# Patient Record
Sex: Male | Born: 1975 | Race: White | Hispanic: No | Smoking: Current every day smoker
Health system: Southern US, Community
[De-identification: ages and names within clinical notes are randomized; demographics above are authoritative.]

## PROBLEM LIST (undated history)

## (undated) DIAGNOSIS — E119 Type 2 diabetes mellitus without complications: Secondary | ICD-10-CM

## (undated) DIAGNOSIS — I10 Essential (primary) hypertension: Secondary | ICD-10-CM

## (undated) DIAGNOSIS — F32A Depression, unspecified: Secondary | ICD-10-CM

## (undated) DIAGNOSIS — J449 Chronic obstructive pulmonary disease, unspecified: Secondary | ICD-10-CM

## (undated) DIAGNOSIS — E7800 Pure hypercholesterolemia, unspecified: Secondary | ICD-10-CM

## (undated) DIAGNOSIS — R45851 Suicidal ideations: Secondary | ICD-10-CM

## (undated) DIAGNOSIS — K219 Gastro-esophageal reflux disease without esophagitis: Secondary | ICD-10-CM

## (undated) DIAGNOSIS — F419 Anxiety disorder, unspecified: Secondary | ICD-10-CM

## (undated) DIAGNOSIS — F329 Major depressive disorder, single episode, unspecified: Secondary | ICD-10-CM

## (undated) HISTORY — PX: VASCULAR SURGERY: SHX849

## (undated) HISTORY — PX: LEG SURGERY: SHX1003

## (undated) HISTORY — PX: MENISCUS REPAIR: SHX5179

## (undated) HISTORY — PX: APPENDECTOMY: SHX54

## (undated) HISTORY — PX: ABDOMINAL SURGERY: SHX537

---

## 2000-07-10 ENCOUNTER — Emergency Department (HOSPITAL_COMMUNITY): Admission: EM | Admit: 2000-07-10 | Discharge: 2000-07-10 | Payer: Self-pay | Admitting: Emergency Medicine

## 2003-03-21 ENCOUNTER — Emergency Department (HOSPITAL_COMMUNITY): Admission: AD | Admit: 2003-03-21 | Discharge: 2003-03-21 | Payer: Self-pay

## 2005-01-30 ENCOUNTER — Ambulatory Visit: Payer: Self-pay | Admitting: Family Medicine

## 2005-03-01 ENCOUNTER — Ambulatory Visit: Payer: Self-pay | Admitting: Family Medicine

## 2005-06-28 ENCOUNTER — Ambulatory Visit: Payer: Self-pay | Admitting: Family Medicine

## 2005-08-03 ENCOUNTER — Ambulatory Visit: Payer: Self-pay | Admitting: Family Medicine

## 2006-03-06 ENCOUNTER — Ambulatory Visit: Payer: Self-pay | Admitting: Physician Assistant

## 2006-06-15 ENCOUNTER — Emergency Department (HOSPITAL_COMMUNITY): Admission: EM | Admit: 2006-06-15 | Discharge: 2006-06-16 | Payer: Self-pay | Admitting: Emergency Medicine

## 2011-04-13 ENCOUNTER — Emergency Department (HOSPITAL_COMMUNITY): Payer: Medicaid Other

## 2011-04-13 ENCOUNTER — Other Ambulatory Visit: Payer: Self-pay

## 2011-04-13 ENCOUNTER — Emergency Department (HOSPITAL_COMMUNITY)
Admission: EM | Admit: 2011-04-13 | Discharge: 2011-04-13 | Disposition: A | Discharge status: Home / Self Care | Payer: Medicaid Other | Attending: Emergency Medicine | Admitting: Emergency Medicine

## 2011-04-13 ENCOUNTER — Encounter (HOSPITAL_COMMUNITY): Payer: Self-pay | Admitting: Emergency Medicine

## 2011-04-13 DIAGNOSIS — R7309 Other abnormal glucose: Secondary | ICD-10-CM | POA: Insufficient documentation

## 2011-04-13 DIAGNOSIS — R42 Dizziness and giddiness: Secondary | ICD-10-CM

## 2011-04-13 DIAGNOSIS — R21 Rash and other nonspecific skin eruption: Secondary | ICD-10-CM | POA: Insufficient documentation

## 2011-04-13 DIAGNOSIS — Z9889 Other specified postprocedural states: Secondary | ICD-10-CM | POA: Insufficient documentation

## 2011-04-13 DIAGNOSIS — K219 Gastro-esophageal reflux disease without esophagitis: Secondary | ICD-10-CM | POA: Insufficient documentation

## 2011-04-13 DIAGNOSIS — F329 Major depressive disorder, single episode, unspecified: Secondary | ICD-10-CM | POA: Insufficient documentation

## 2011-04-13 DIAGNOSIS — I10 Essential (primary) hypertension: Secondary | ICD-10-CM | POA: Insufficient documentation

## 2011-04-13 DIAGNOSIS — W19XXXA Unspecified fall, initial encounter: Secondary | ICD-10-CM | POA: Insufficient documentation

## 2011-04-13 DIAGNOSIS — R51 Headache: Secondary | ICD-10-CM | POA: Insufficient documentation

## 2011-04-13 DIAGNOSIS — Z136 Encounter for screening for cardiovascular disorders: Secondary | ICD-10-CM | POA: Insufficient documentation

## 2011-04-13 DIAGNOSIS — R112 Nausea with vomiting, unspecified: Secondary | ICD-10-CM | POA: Insufficient documentation

## 2011-04-13 DIAGNOSIS — F17200 Nicotine dependence, unspecified, uncomplicated: Secondary | ICD-10-CM | POA: Insufficient documentation

## 2011-04-13 DIAGNOSIS — H539 Unspecified visual disturbance: Secondary | ICD-10-CM | POA: Insufficient documentation

## 2011-04-13 DIAGNOSIS — F3289 Other specified depressive episodes: Secondary | ICD-10-CM | POA: Insufficient documentation

## 2011-04-13 HISTORY — DX: Depression, unspecified: F32.A

## 2011-04-13 HISTORY — DX: Gastro-esophageal reflux disease without esophagitis: K21.9

## 2011-04-13 HISTORY — DX: Major depressive disorder, single episode, unspecified: F32.9

## 2011-04-13 HISTORY — DX: Chronic obstructive pulmonary disease, unspecified: J44.9

## 2011-04-13 HISTORY — DX: Essential (primary) hypertension: I10

## 2011-04-13 LAB — COMPREHENSIVE METABOLIC PANEL
BUN: 5 mg/dL — ABNORMAL LOW (ref 6–23)
CO2: 24 mEq/L (ref 19–32)
Calcium: 9.2 mg/dL (ref 8.4–10.5)
Chloride: 100 mEq/L (ref 96–112)
Creatinine, Ser: 0.78 mg/dL (ref 0.50–1.35)
GFR calc Af Amer: >90 mL/min (ref >90)
GFR calc non Af Amer: >90 mL/min (ref >90)
Total Bilirubin: 0.3 mg/dL (ref 0.3–1.2)

## 2011-04-13 LAB — CBC
HCT: 40.5 % (ref 39.0–52.0)
MCH: 30.6 pg (ref 26.0–34.0)
MCV: 84.2 fL (ref 78.0–100.0)
RBC: 4.81 MIL/uL (ref 4.22–5.81)
WBC: 7.6 10*3/uL (ref 4.0–10.5)

## 2011-04-13 MED ORDER — SODIUM CHLORIDE 0.9 % IV SOLN
INTRAVENOUS | Status: DC
  Administered 2011-04-13: 18:00:00 via INTRAVENOUS

## 2011-04-13 MED ORDER — MECLIZINE HCL 12.5 MG PO TABS
25.0000 mg | ORAL_TABLET | Freq: Once | ORAL | Status: AC
  Administered 2011-04-13: 25 mg via ORAL
  Filled 2011-04-13: qty 2

## 2011-04-13 MED ORDER — ONDANSETRON HCL 4 MG/2ML IJ SOLN
4.0000 mg | Freq: Once | INTRAMUSCULAR | Status: AC
  Administered 2011-04-13: 4 mg via INTRAVENOUS
  Filled 2011-04-13: qty 2

## 2011-04-13 MED ORDER — SODIUM CHLORIDE 0.9 % IV BOLUS (SEPSIS)
250.0000 mL | Freq: Once | INTRAVENOUS | Status: AC
  Administered 2011-04-13: 250 mL via INTRAVENOUS

## 2011-04-13 NOTE — ED Provider Notes (Addendum)
History     CSN: [id number]  Arrival date & time 04/13/11  1634   First MD Initiated Contact with Patient 04/13/11 1656      Chief Complaint  Patient presents with  . Fall  . Dizziness    (Consider location/radiation/quality/duration/timing/severity/associated sxs/prior treatment) The history is provided by the patient and the spouse.  patient is a 36 year old male with the complaint of dizziness room spinning and falling over and nausea from the spinning. Symptoms started 3 days ago. Patient was seen at [facility] 2 days ago given a diagnosis of vertigo and sent home with Antivert. The Antivert helps some but patient still having trouble with the room spinning and made worse by moving his head at times makes difficult to keep his balance and has fallen over. He denies any upper tremulous 70 weakness. Admits to the nausea and mild headache. No other systemic symptoms. More it did not do any lab test or CAT scans.  Past Medical History  Diagnosis Date  . Hypertension   . GERD (gastroesophageal reflux disease)   . COPD (chronic obstructive pulmonary disease)   . Depression     Past Surgical History  Procedure Date  . Appendectomy   . Abdominal surgery   . Meniscus repair   . Vascular surgery   . Leg surgery     when 12. Patient reports he was in tractor accident and leg had to be repaired    No family history on file.  History  Substance Use Topics  . Smoking status: Current Everyday Smoker -- 1.5 packs/day    Types: Cigarettes  . Smokeless tobacco: Not on file  . Alcohol Use: No      Review of Systems  Constitutional: Negative for fever.  HENT: Negative for congestion, sore throat, neck pain and neck stiffness.   Eyes: Positive for visual disturbance. Negative for photophobia, pain and redness.  Respiratory: Negative for cough and shortness of breath.   Cardiovascular: Negative for chest pain.  Gastrointestinal: Positive for nausea and vomiting. Negative for  abdominal pain and diarrhea.  Genitourinary: Negative for dysuria.  Musculoskeletal: Negative for back pain.  Neurological: Positive for dizziness and headaches. Negative for facial asymmetry, speech difficulty, weakness and numbness.  Hematological: Does not bruise/bleed easily.  Psychiatric/Behavioral: Negative for confusion.    Allergies  Pineapple  Home Medications   Current Outpatient Rx  Name Route Sig Dispense Refill  . BENAZEPRIL HCL 20 MG PO TABS Oral Take 20 mg by mouth daily.    Marland Kitchen CITALOPRAM HYDROBROMIDE 20 MG PO TABS Oral Take 20 mg by mouth daily.    Marland Kitchen ESOMEPRAZOLE MAGNESIUM 40 MG PO CPDR Oral Take 40 mg by mouth daily before breakfast.    . IBUPROFEN 200 MG PO TABS Oral Take 200 mg by mouth 3 (three) times daily as needed. For sinus relief    . LORAZEPAM 0.5 MG PO TABS Oral Take 0.5 mg by mouth 2 (two) times daily as needed. For anxiety    . MELOXICAM 7.5 MG PO TABS Oral Take 7.5 mg by mouth 2 (two) times daily.    Marland Kitchen SINUS RELIEF PO Oral Take 2 tablets by mouth 3 (three) times daily as needed. For sinus relief    . TIOTROPIUM BROMIDE MONOHYDRATE 18 MCG IN CAPS Inhalation Place 18 mcg into inhaler and inhale daily.      BP 149/97  Pulse 96  Temp(Src) 97.8 F (36.6 C) (Oral)  Resp 18  Ht 6' (1.829 m)  Wt 240 lb (  108.863 kg)  BMI 32.55 kg/m2  SpO2 97%  Physical Exam  Nursing note and vitals reviewed. Constitutional: He is oriented to person, place, and time. He appears well-developed and well-nourished. No distress.  HENT:  Head: Normocephalic and atraumatic.  Mouth/Throat: Oropharynx is clear and moist.  Eyes: Conjunctivae and EOM are normal. Pupils are equal, round, and reactive to light.  Neck: Normal range of motion. Neck supple.  Cardiovascular: Normal rate, regular rhythm and normal heart sounds.   No murmur heard. Pulmonary/Chest: Effort normal and breath sounds normal. He has no wheezes.  Abdominal: Soft. Bowel sounds are normal. There is no  tenderness.  Musculoskeletal: Normal range of motion. He exhibits no edema.  Lymphadenopathy:    He has no cervical adenopathy.  Neurological: He is alert and oriented to person, place, and time. No cranial nerve deficit. He exhibits normal muscle tone. Coordination normal.  Skin: Skin is warm. Rash noted. No erythema.       A few petechiae from the sock line around the ankle down on the feet on both legs. No rash anywhere else.    ED Course  Procedures (including critical care time)  Labs Reviewed  CBC - Abnormal; Notable for the following:    MCHC 36.3 (*)    All other components within normal limits  COMPREHENSIVE METABOLIC PANEL - Abnormal; Notable for the following:    Sodium 134 (*)    Glucose, Bld 257 (*)    BUN 5 (*)    All other components within normal limits   Dg Chest 2 View  04/13/2011  *RADIOLOGY REPORT*  Clinical Data: Dizziness, vertigo  CHEST - 2 VIEW  Comparison: 06/15/2006  Findings: Lungs are clear. No pleural effusion or pneumothorax.  Cardiomediastinal silhouette is within normal limits.  Visualized osseous structures are within normal limits.  IMPRESSION: Normal chest radiographs.  Original Report Authenticated By: Charline Bills, M.D.   Ct Head Wo Contrast  04/13/2011  *RADIOLOGY REPORT*  Clinical Data: Fall, dizziness  CT HEAD WITHOUT CONTRAST  Technique:  Contiguous axial images were obtained from the base of the skull through the vertex without contrast.  Comparison: [place] CT head dated 10/18/2010  Findings: No evidence of parenchymal hemorrhage or extra-axial fluid collection. No mass lesion, mass effect, or midline shift.  No CT evidence of acute infarction.  Cerebral volume is age appropriate.  No ventriculomegaly.  Partial opacification of the left ethmoid and sphenoid sinuses. The visualized paranasal sinuses and mastoid air cells are otherwise clear.  No evidence of calvarial fracture.  IMPRESSION: No evidence of acute intracranial abnormality.   Original Report Authenticated By: Charline Bills, M.D.   Results for orders placed during the hospital encounter of 04/13/11  CBC      Component Value Range   WBC 7.6  4.0 - 10.5 (K/uL)   RBC 4.81  4.22 - 5.81 (MIL/uL)   Hemoglobin 14.7  13.0 - 17.0 (g/dL)   HCT 16.1  09.6 - 04.5 (%)   MCV 84.2  78.0 - 100.0 (fL)   MCH 30.6  26.0 - 34.0 (pg)   MCHC 36.3 (*) 30.0 - 36.0 (g/dL)   RDW 40.9  81.1 - 91.4 (%)   Platelets 202  150 - 400 (K/uL)  COMPREHENSIVE METABOLIC PANEL      Component Value Range   Sodium 134 (*) 135 - 145 (mEq/L)   Potassium 3.9  3.5 - 5.1 (mEq/L)   Chloride 100  96 - 112 (mEq/L)   CO2 24  19 -  32 (mEq/L)   Glucose, Bld 257 (*) 70 - 99 (mg/dL)   BUN 5 (*) 6 - 23 (mg/dL)   Creatinine, Ser 1.61  0.50 - 1.35 (mg/dL)   Calcium 9.2  8.4 - 09.6 (mg/dL)   Total Protein 6.5  6.0 - 8.3 (g/dL)   Albumin 4.0  3.5 - 5.2 (g/dL)   AST 26  0 - 37 (U/L)   ALT 39  0 - 53 (U/L)   Alkaline Phosphatase 60  39 - 117 (U/L)   Total Bilirubin 0.3  0.3 - 1.2 (mg/dL)   GFR calc non Af Amer >90  >90 (mL/min)   GFR calc Af Amer >90  >90 (mL/min)    Date: 04/13/2011  Rate: 79  Rhythm: normal sinus rhythm  QRS Axis: normal  Intervals: normal  ST/T Wave abnormalities: nonspecific T wave changes  Conduction Disutrbances:none  Narrative Interpretation:   Old EKG Reviewed: none available    1. Vertigo       MDM   Symptoms consistent with vertigo benign positional, most likely viral in etiology. Today's workup negative for any significant findings in the head chest x-ray. Labs without any significant amount his other than an elevation in the blood sugar which will require followup with his primary care Dr. Janae Bridgeman be related to the nausea and vomiting related to the vertigo or may actually be indicative of early diabetes.  The patient will followup with primary care Dr. If symptoms persist may require MRI and/or neurology or ENT evaluation. We'll continue Antivert for now.  EKG  without any arrhythmias. Cardiac monitoring any sniffing findings.  Improved with Antivert in the emergency department.       Shelda Jakes, MD 04/13/11 0454  Shelda Jakes, MD 04/13/11 [phone]

## 2011-04-13 NOTE — ED Notes (Signed)
Patient reports that he is having pain in left side of his neck that radiates from his ear to his left shoulder that started after his fall around 1600. Patient describes pain as "like a toothache, throbbing and aching"

## 2011-04-13 NOTE — ED Notes (Signed)
Patient arrives via EMS with c/o fall and dizziness. Patient treated at [facility] Wednesday for Vertigo and treated with meclizine. Patient reports several episodes of sudden onset of dizziness where he falls to the floor. Patient mobile on scene when EMS arrived.

## 2011-04-13 NOTE — ED Notes (Signed)
Patient's IV started by EMS in Left AC infiltrated. Patient c/o burning pain. Swelling noted. IV discontinued by Tarri Glenn, RN and new IV inserted.

## 2011-06-04 ENCOUNTER — Encounter (HOSPITAL_COMMUNITY): Payer: Self-pay | Admitting: *Deleted

## 2011-06-04 ENCOUNTER — Emergency Department (HOSPITAL_COMMUNITY)
Admission: EM | Admit: 2011-06-04 | Discharge: 2011-06-04 | Disposition: A | Discharge status: Home Health | Payer: Medicaid Other | Attending: Emergency Medicine | Admitting: Emergency Medicine

## 2011-06-04 DIAGNOSIS — F17200 Nicotine dependence, unspecified, uncomplicated: Secondary | ICD-10-CM | POA: Insufficient documentation

## 2011-06-04 DIAGNOSIS — J4489 Other specified chronic obstructive pulmonary disease: Secondary | ICD-10-CM | POA: Insufficient documentation

## 2011-06-04 DIAGNOSIS — I10 Essential (primary) hypertension: Secondary | ICD-10-CM | POA: Insufficient documentation

## 2011-06-04 DIAGNOSIS — Z79899 Other long term (current) drug therapy: Secondary | ICD-10-CM | POA: Insufficient documentation

## 2011-06-04 DIAGNOSIS — F3289 Other specified depressive episodes: Secondary | ICD-10-CM | POA: Insufficient documentation

## 2011-06-04 DIAGNOSIS — K219 Gastro-esophageal reflux disease without esophagitis: Secondary | ICD-10-CM | POA: Insufficient documentation

## 2011-06-04 DIAGNOSIS — J449 Chronic obstructive pulmonary disease, unspecified: Secondary | ICD-10-CM | POA: Insufficient documentation

## 2011-06-04 DIAGNOSIS — F329 Major depressive disorder, single episode, unspecified: Secondary | ICD-10-CM | POA: Insufficient documentation

## 2011-06-04 LAB — CBC
Hemoglobin: 15.7 g/dL (ref 13.0–17.0)
Platelets: 227 10*3/uL (ref 150–400)
RBC: 5.12 MIL/uL (ref 4.22–5.81)
WBC: 7.4 10*3/uL (ref 4.0–10.5)

## 2011-06-04 LAB — COMPREHENSIVE METABOLIC PANEL
ALT: 46 U/L (ref 0–53)
AST: 32 U/L (ref 0–37)
Alkaline Phosphatase: 60 U/L (ref 39–117)
CO2: 28 mEq/L (ref 19–32)
Calcium: 9.6 mg/dL (ref 8.4–10.5)
Chloride: 99 mEq/L (ref 96–112)
GFR calc non Af Amer: >90 mL/min (ref >90)
Potassium: 3.7 mEq/L (ref 3.5–5.1)
Sodium: 136 mEq/L (ref 135–145)
Total Bilirubin: 0.6 mg/dL (ref 0.3–1.2)

## 2011-06-04 NOTE — ED Provider Notes (Signed)
History   This chart was scribed for Nelia Shi, MD by Sofie Rower. The patient was seen in room APAH8/APAH8 and the patient's care was started at 4:14PM.    CSN: [id number]  Arrival date & time 06/04/11  1509   First MD Initiated Contact with Patient 06/04/11 1605      Chief Complaint  Patient presents with  . Medical Clearance    (Consider location/radiation/quality/duration/timing/severity/associated sxs/prior treatment) HPI  Willie Herman is a 36 y.o. male who presents to the Emergency Department complaining of severe, episodic depression onset today with associated symptoms of suicidal ideations. Pt states "I feel like I need some help, my medicine is all messed up." Pt states "he went to visit the Doctor this morning and his medication was changed." Pt relative states the pt would go from being very angry to crying when on his previous medication (Celexa). Pt relative states the pt has suicidal ideations and extreme anger outbursts.   Pt has a hx of taking celexa.       Past Medical History  Diagnosis Date  . Hypertension   . GERD (gastroesophageal reflux disease)   . COPD (chronic obstructive pulmonary disease)   . Depression     Past Surgical History  Procedure Date  . Appendectomy   . Abdominal surgery   . Meniscus repair   . Vascular surgery   . Leg surgery     when 12. Patient reports he was in tractor accident and leg had to be repaired    History reviewed. No pertinent family history.  History  Substance Use Topics  . Smoking status: Current Everyday Smoker -- 1.5 packs/day    Types: Cigarettes  . Smokeless tobacco: Not on file  . Alcohol Use: No      Review of Systems  All other systems reviewed and are negative.    10 Systems reviewed and are negative for acute change except as noted in the HPI.   Allergies  Pineapple  Home Medications   Current Outpatient Rx  Name Route Sig Dispense Refill  . ALPRAZOLAM 1 MG PO TABS Oral Take 1  mg by mouth 3 (three) times daily as needed.    Marland Kitchen AMLODIPINE BESYLATE 5 MG PO TABS Oral Take 5 mg by mouth daily.    Marland Kitchen ESCITALOPRAM OXALATE 20 MG PO TABS Oral Take 10 mg by mouth daily.    Marland Kitchen FLUTICASONE PROPIONATE 50 MCG/ACT NA SUSP Nasal Place 2 sprays into the nose daily.    Marland Kitchen LORAZEPAM 0.5 MG PO TABS Oral Take 0.5 mg by mouth 2 (two) times daily as needed. For anxiety    . TIOTROPIUM BROMIDE MONOHYDRATE 18 MCG IN CAPS Inhalation Place 18 mcg into inhaler and inhale daily.    Marland Kitchen VILAZODONE HCL 40 MG PO TABS Oral Take 40 mg by mouth daily.    . ALBUTEROL SULFATE HFA 108 (90 BASE) MCG/ACT IN AERS Inhalation Inhale 2 puffs into the lungs every 4 (four) hours as needed.    Marland Kitchen BENAZEPRIL HCL 20 MG PO TABS Oral Take 20 mg by mouth daily.    Marland Kitchen ESOMEPRAZOLE MAGNESIUM 40 MG PO CPDR Oral Take 40 mg by mouth daily before breakfast.    . IBUPROFEN 200 MG PO TABS Oral Take 200 mg by mouth 3 (three) times daily as needed. For sinus relief    . SINUS RELIEF PO Oral Take 2 tablets by mouth 3 (three) times daily as needed. For sinus relief  BP 139/97  Pulse 86  Temp(Src) 97.4 F (36.3 C) (Oral)  Resp 18  Ht 6' (1.829 m)  Wt 246 lb (111.585 kg)  BMI 33.36 kg/m2  SpO2 97%  Physical Exam  Nursing note and vitals reviewed. Constitutional: He is oriented to person, place, and time. He appears well-developed and well-nourished. No distress.  HENT:  Head: Normocephalic and atraumatic.  Nose: Nose normal.  Eyes: EOM are normal. Pupils are equal, round, and reactive to light.  Neck: Normal range of motion.  Cardiovascular: Normal rate and intact distal pulses.   Pulmonary/Chest: No respiratory distress.  Abdominal: Normal appearance. He exhibits no distension.  Musculoskeletal: Normal range of motion.  Neurological: He is alert and oriented to person, place, and time. No cranial nerve deficit.  Skin: Skin is warm and dry. No rash noted.    ED Course  Procedures (including critical care  time)  DIAGNOSTIC STUDIES: Oxygen Saturation is 97% on room air , normal by my interpretation.    COORDINATION OF CARE:     Labs Reviewed  CBC - Abnormal; Notable for the following:    MCHC 36.6 (*)    All other components within normal limits  COMPREHENSIVE METABOLIC PANEL - Abnormal; Notable for the following:    Glucose, Bld 134 (*)    All other components within normal limits  ETHANOL  URINE RAPID DRUG SCREEN (HOSP PERFORMED)   No results found.   1. Depression      4:16PM-EDP at bedside discusses treatment plan concerning Tele psych.   4:50PM- Physician Consult. Dr. Radford Pax speaks with Psychiatrist who will conduct the Tele Psych examination.   MDM  [place] psychiatrist evaluated the patient in the emergency department and recommended continuation with his medications.  No psychiatric admission indicated at this time.      I personally performed the services described in this documentation, which was scribed in my presence. The recorded information has been reviewed and considered.   Nelia Shi, MD 06/04/11 [phone]

## 2011-06-04 NOTE — ED Notes (Signed)
Pt left prior to receiving d/c pprwrk

## 2011-06-04 NOTE — Discharge Instructions (Signed)
Depression  Depression is a strong emotion of feeling unhappy that can last for weeks, months, or even longer. Depression causes problems with the ability to function in life. It upsets your:   Relationships.   Sleep.   Eating habits.   Work habits.  HOME CARE  Take all medicine as told by your doctor.   Talk with a therapist, counselor, or friend.   Eat a healthy diet.   Exercise regularly.   Do not drink alcohol or use drugs.  GET HELP RIGHT AWAY IF: You start to have thoughts about hurting yourself or others. MAKE SURE YOU:  Understand these instructions.   Will watch your condition.   Will get help right away if you are not doing well or get worse.  Document Released: 04/14/2010 Document Revised: 03/01/2011 Document Reviewed: 04/14/2010 Exit[employer] Patient Information 2012 [employer]. 

## 2011-06-04 NOTE — ED Notes (Signed)
Had medication change, says the meds are not working for him.  "sometimes I Haw[place]'t feel like being here"

## 2011-12-02 ENCOUNTER — Emergency Department (HOSPITAL_COMMUNITY)
Admission: EM | Admit: 2011-12-02 | Discharge: 2011-12-02 | Disposition: A | Discharge status: Home / Self Care | Payer: Medicaid Other | Attending: Emergency Medicine | Admitting: Emergency Medicine

## 2011-12-02 ENCOUNTER — Emergency Department (HOSPITAL_COMMUNITY): Payer: Medicaid Other

## 2011-12-02 ENCOUNTER — Encounter (HOSPITAL_COMMUNITY): Payer: Self-pay | Admitting: *Deleted

## 2011-12-02 DIAGNOSIS — F3289 Other specified depressive episodes: Secondary | ICD-10-CM | POA: Insufficient documentation

## 2011-12-02 DIAGNOSIS — S93409A Sprain of unspecified ligament of unspecified ankle, initial encounter: Secondary | ICD-10-CM | POA: Insufficient documentation

## 2011-12-02 DIAGNOSIS — F17200 Nicotine dependence, unspecified, uncomplicated: Secondary | ICD-10-CM | POA: Insufficient documentation

## 2011-12-02 DIAGNOSIS — F329 Major depressive disorder, single episode, unspecified: Secondary | ICD-10-CM | POA: Insufficient documentation

## 2011-12-02 DIAGNOSIS — J4489 Other specified chronic obstructive pulmonary disease: Secondary | ICD-10-CM | POA: Insufficient documentation

## 2011-12-02 DIAGNOSIS — Z79899 Other long term (current) drug therapy: Secondary | ICD-10-CM | POA: Insufficient documentation

## 2011-12-02 DIAGNOSIS — J449 Chronic obstructive pulmonary disease, unspecified: Secondary | ICD-10-CM | POA: Insufficient documentation

## 2011-12-02 DIAGNOSIS — I10 Essential (primary) hypertension: Secondary | ICD-10-CM | POA: Insufficient documentation

## 2011-12-02 DIAGNOSIS — W219XXA Striking against or struck by unspecified sports equipment, initial encounter: Secondary | ICD-10-CM | POA: Insufficient documentation

## 2011-12-02 DIAGNOSIS — S20219A Contusion of unspecified front wall of thorax, initial encounter: Secondary | ICD-10-CM | POA: Insufficient documentation

## 2011-12-02 DIAGNOSIS — S93401A Sprain of unspecified ligament of right ankle, initial encounter: Secondary | ICD-10-CM

## 2011-12-02 DIAGNOSIS — K219 Gastro-esophageal reflux disease without esophagitis: Secondary | ICD-10-CM | POA: Insufficient documentation

## 2011-12-02 DIAGNOSIS — Y9361 Activity, american tackle football: Secondary | ICD-10-CM | POA: Insufficient documentation

## 2011-12-02 DIAGNOSIS — Z9089 Acquired absence of other organs: Secondary | ICD-10-CM | POA: Insufficient documentation

## 2011-12-02 MED ORDER — IBUPROFEN 800 MG PO TABS
800.0000 mg | ORAL_TABLET | Freq: Once | ORAL | Status: AC
  Administered 2011-12-02: 800 mg via ORAL
  Filled 2011-12-02: qty 1

## 2011-12-02 MED ORDER — IBUPROFEN 800 MG PO TABS: 800.0000 mg | ORAL_TABLET | Freq: Three times a day (TID) | ORAL | Status: AC

## 2011-12-02 MED ORDER — METHOCARBAMOL 500 MG PO TABS
1000.0000 mg | ORAL_TABLET | Freq: Once | ORAL | Status: AC
  Administered 2011-12-02: 1000 mg via ORAL
  Filled 2011-12-02: qty 2

## 2011-12-02 MED ORDER — METHOCARBAMOL 500 MG PO TABS: ORAL_TABLET | ORAL | Status: DC

## 2011-12-02 NOTE — ED Provider Notes (Signed)
History     CSN: [id number]  Arrival date & time 12/02/11  4098   First MD Initiated Contact with Patient 12/02/11 1911      Chief Complaint  Patient presents with  . Rib Injury  . Ankle Pain    (Consider location/radiation/quality/duration/timing/severity/associated sxs/prior treatment) HPI Comments: Patient states he was helping a football pain and no practice on September 6. The patient states he sustained a hit to the right ribs and as a result twisted the right ankle. He is bedtime to rest the area but the pain seems to be getting progressively worse he presents to the emergency department for evaluation of this problem. The patient denies cough. He denies any hemoptysis. He has had some swelling of the ankle some pain when applying weight but is able to walk slowly.  The history is provided by the patient.    Past Medical History  Diagnosis Date  . Hypertension   . GERD (gastroesophageal reflux disease)   . COPD (chronic obstructive pulmonary disease)   . Depression     Past Surgical History  Procedure Date  . Appendectomy   . Abdominal surgery   . Meniscus repair   . Vascular surgery   . Leg surgery     when 12. Patient reports he was in tractor accident and leg had to be repaired    No family history on file.  History  Substance Use Topics  . Smoking status: Current Everyday Smoker -- 1.5 packs/day    Types: Cigarettes  . Smokeless tobacco: Not on file  . Alcohol Use: No      Review of Systems  Constitutional: Negative for activity change.       All ROS Neg except as noted in HPI  HENT: Negative for nosebleeds and neck pain.   Eyes: Negative for photophobia and discharge.  Respiratory: Positive for shortness of breath. Negative for cough and wheezing.   Cardiovascular: Negative for chest pain and palpitations.  Gastrointestinal: Negative for abdominal pain and blood in stool.  Genitourinary: Negative for dysuria, frequency and hematuria.    Musculoskeletal: Negative for back pain and arthralgias.  Skin: Negative.   Neurological: Negative for dizziness, seizures and speech difficulty.  Psychiatric/Behavioral: Negative for hallucinations and confusion.       Depression    Allergies  Pineapple  Home Medications   Current Outpatient Rx  Name Route Sig Dispense Refill  . ALBUTEROL SULFATE HFA 108 (90 BASE) MCG/ACT IN AERS Inhalation Inhale 2 puffs into the lungs every 4 (four) hours as needed.    Marland Kitchen AMLODIPINE BESYLATE 5 MG PO TABS Oral Take 5 mg by mouth daily.    Marland Kitchen BENAZEPRIL HCL 20 MG PO TABS Oral Take 20 mg by mouth daily.    Marland Kitchen ESCITALOPRAM OXALATE 20 MG PO TABS Oral Take 10 mg by mouth daily.    Marland Kitchen ESOMEPRAZOLE MAGNESIUM 40 MG PO CPDR Oral Take 40 mg by mouth daily before breakfast.    . FLUTICASONE PROPIONATE 50 MCG/ACT NA SUSP Nasal Place 2 sprays into the nose daily.    Marland Kitchen TIOTROPIUM BROMIDE MONOHYDRATE 18 MCG IN CAPS Inhalation Place 18 mcg into inhaler and inhale daily.      BP 148/77  Pulse 72  Temp 98.4 F (36.9 C) (Oral)  Resp 20  Ht 5\' 11"  (1.803 m)  Wt 232 lb (105.235 kg)  BMI 32.36 kg/m2  SpO2 98%  Physical Exam  Nursing note and vitals reviewed. Constitutional: He is oriented to person, place, and  time. He appears well-developed and well-nourished.  Non-toxic appearance.  HENT:  Head: Normocephalic.  Right Ear: Tympanic membrane and external ear normal.  Left Ear: Tympanic membrane and external ear normal.  Eyes: EOM and lids are normal. Pupils are equal, round, and reactive to light.  Neck: Normal range of motion. Neck supple. Carotid bruit is not present.  Cardiovascular: Normal rate, regular rhythm, normal heart sounds, intact distal pulses and normal pulses.   Pulmonary/Chest: Breath sounds normal. No respiratory distress.       There is soreness to palpation of the right mid chest wall area laterally over the ribs. There is no deformity. There is no bruise noted. There is no crepitus.   Abdominal: Soft. Bowel sounds are normal. There is no tenderness. There is no guarding.       No organomegaly.  Musculoskeletal: Normal range of motion.       There is pain and swelling over the right lateral malleolus. There is full range of motion of the toes. There is good capillary refill. The Achilles tendon is intact. There is no palpable deformity of the tibia or fibula. Distal pulses are 2+ and symmetrical.  Lymphadenopathy:       Head (right side): No submandibular adenopathy present.       Head (left side): No submandibular adenopathy present.    He has no cervical adenopathy.  Neurological: He is alert and oriented to person, place, and time. He has normal strength. No cranial nerve deficit or sensory deficit.  Skin: Skin is warm and dry.  Psychiatric: He has a normal mood and affect. His speech is normal.    ED Course  Procedures (including critical care time)  Labs Reviewed - No data to display Dg Ribs Unilateral W/chest Right  12/02/2011  *RADIOLOGY REPORT*  Clinical Data: Right rib pain.  RIGHT RIBS AND CHEST - 3+ VIEW  Comparison: Chest x-ray 04/13/2011.  Findings: The cardiac silhouette, mediastinal and hilar contours are within normal limits and stable.  The lungs are clear.  No pleural effusion or pneumothorax.  Dedicated views of the right ribs demonstrate no definite acute right rib fracture.  No destructive bony changes.  Remote healed rib fractures are suspected.  IMPRESSION:  1.  No acute cardiopulmonary findings. 2.  No definite acute right-sided rib fractures.   Original Report Authenticated By: P. Loralie Champagne, M.D.    Dg Ankle Complete Right  12/02/2011  *RADIOLOGY REPORT*  Clinical Data: Right ankle injury.  RIGHT ANKLE - COMPLETE 3+ VIEW  Comparison: 06/15/2006.  Findings: The ankle mortise is maintained.  Mild ankle joint degenerative changes.  No acute fracture or osteochondral abnormality.  IMPRESSION: No acute bony findings.   Original Report Authenticated By:  P. Loralie Champagne, M.D.      1. Contusion of ribs   2. Right ankle sprain       MDM  I have reviewed nursing notes, vital signs, and all appropriate lab and imaging results for this patient. The x-ray of the right ribs and chest is negative for cardiopulmonary findings. The x-ray is also negative for acute right-sided rib fractures. The x-ray of the right ankle is negative for acute bony findings. The plan at this time is for the patient to practice deep breaths frequently. He is given a prescription for ibuprofen 800 mg 3 times daily, and Robaxin 3 times daily for spasm. With the warning that the Robaxin may cause drowsiness. Patient was also fitted with an ankle stirrup splint, to use for  the next 10 days.       Kathie Dike, [place] 12/02/11 1956

## 2011-12-02 NOTE — ED Provider Notes (Signed)
Medical screening examination/treatment/procedure(s) were performed by non-physician practitioner and as supervising physician I was immediately available for consultation/collaboration.  Jakaylah Schlafer, MD 12/02/11 2045 

## 2011-12-02 NOTE — ED Notes (Signed)
Pt c/o right rib pain and right ankle pain, pt states that he was helping in football practice when he was hit in the right rib area and twisted right ankle, happened Friday evening.

## 2011-12-02 NOTE — ED Notes (Signed)
Discharge instructions reviewed with pt, questions answered. Pt verbalized understanding.  

## 2012-03-19 ENCOUNTER — Encounter (HOSPITAL_COMMUNITY): Payer: Self-pay | Admitting: Emergency Medicine

## 2012-03-19 ENCOUNTER — Emergency Department (HOSPITAL_COMMUNITY): Payer: MEDICAID

## 2012-03-19 ENCOUNTER — Emergency Department (HOSPITAL_COMMUNITY)
Admission: EM | Admit: 2012-03-19 | Discharge: 2012-03-20 | Disposition: A | Discharge status: Home / Self Care | Payer: MEDICAID | Attending: Emergency Medicine | Admitting: Emergency Medicine

## 2012-03-19 DIAGNOSIS — E7800 Pure hypercholesterolemia, unspecified: Secondary | ICD-10-CM | POA: Insufficient documentation

## 2012-03-19 DIAGNOSIS — Z79899 Other long term (current) drug therapy: Secondary | ICD-10-CM | POA: Insufficient documentation

## 2012-03-19 DIAGNOSIS — F17200 Nicotine dependence, unspecified, uncomplicated: Secondary | ICD-10-CM | POA: Insufficient documentation

## 2012-03-19 DIAGNOSIS — F419 Anxiety disorder, unspecified: Secondary | ICD-10-CM

## 2012-03-19 DIAGNOSIS — Y929 Unspecified place or not applicable: Secondary | ICD-10-CM | POA: Insufficient documentation

## 2012-03-19 DIAGNOSIS — T43591A Poisoning by other antipsychotics and neuroleptics, accidental (unintentional), initial encounter: Secondary | ICD-10-CM | POA: Insufficient documentation

## 2012-03-19 DIAGNOSIS — Z8659 Personal history of other mental and behavioral disorders: Secondary | ICD-10-CM | POA: Insufficient documentation

## 2012-03-19 DIAGNOSIS — F411 Generalized anxiety disorder: Secondary | ICD-10-CM | POA: Insufficient documentation

## 2012-03-19 DIAGNOSIS — E119 Type 2 diabetes mellitus without complications: Secondary | ICD-10-CM | POA: Insufficient documentation

## 2012-03-19 DIAGNOSIS — J4489 Other specified chronic obstructive pulmonary disease: Secondary | ICD-10-CM | POA: Insufficient documentation

## 2012-03-19 DIAGNOSIS — F3289 Other specified depressive episodes: Secondary | ICD-10-CM | POA: Insufficient documentation

## 2012-03-19 DIAGNOSIS — F329 Major depressive disorder, single episode, unspecified: Secondary | ICD-10-CM

## 2012-03-19 DIAGNOSIS — I10 Essential (primary) hypertension: Secondary | ICD-10-CM | POA: Insufficient documentation

## 2012-03-19 DIAGNOSIS — Z8719 Personal history of other diseases of the digestive system: Secondary | ICD-10-CM | POA: Insufficient documentation

## 2012-03-19 DIAGNOSIS — J449 Chronic obstructive pulmonary disease, unspecified: Secondary | ICD-10-CM | POA: Insufficient documentation

## 2012-03-19 DIAGNOSIS — Y939 Activity, unspecified: Secondary | ICD-10-CM | POA: Insufficient documentation

## 2012-03-19 HISTORY — DX: Suicidal ideations: R45.851

## 2012-03-19 HISTORY — DX: Type 2 diabetes mellitus without complications: E11.9

## 2012-03-19 HISTORY — DX: Pure hypercholesterolemia, unspecified: E78.00

## 2012-03-19 HISTORY — DX: Anxiety disorder, unspecified: F41.9

## 2012-03-19 LAB — CBC WITH DIFFERENTIAL/PLATELET
Basophils Absolute: 0 10*3/uL (ref 0.0–0.1)
Basophils Relative: 0 % (ref 0–1)
Eosinophils Absolute: 0.2 10*3/uL (ref 0.0–0.7)
Eosinophils Relative: 3 % (ref 0–5)
HCT: 42.8 % (ref 39.0–52.0)
Lymphocytes Relative: 38 % (ref 12–46)
MCH: 30.5 pg (ref 26.0–34.0)
MCHC: 36.9 g/dL — ABNORMAL HIGH (ref 30.0–36.0)
MCV: 82.6 fL (ref 78.0–100.0)
Monocytes Absolute: 0.5 10*3/uL (ref 0.1–1.0)
Platelets: 218 10*3/uL (ref 150–400)
RBC: 5.18 MIL/uL (ref 4.22–5.81)
RDW: 11.7 % (ref 11.5–15.5)

## 2012-03-19 LAB — RAPID URINE DRUG SCREEN, HOSP PERFORMED
Barbiturates: NOT DETECTED
Opiates: NOT DETECTED
Tetrahydrocannabinol: NOT DETECTED

## 2012-03-19 LAB — SALICYLATE LEVEL: Salicylate Lvl: <2 mg/dL — ABNORMAL LOW (ref 2.8–20.0)

## 2012-03-19 LAB — COMPREHENSIVE METABOLIC PANEL
AST: 28 U/L (ref 0–37)
Albumin: 4.2 g/dL (ref 3.5–5.2)
BUN: 8 mg/dL (ref 6–23)
Calcium: 9.4 mg/dL (ref 8.4–10.5)
Creatinine, Ser: 0.87 mg/dL (ref 0.50–1.35)
Total Protein: 7.1 g/dL (ref 6.0–8.3)

## 2012-03-19 LAB — ACETAMINOPHEN LEVEL: Acetaminophen (Tylenol), Serum: <15 ug/mL (ref 10–30)

## 2012-03-19 MED ORDER — SODIUM CHLORIDE 0.9 % IV SOLN
INTRAVENOUS | Status: DC
  Administered 2012-03-19: 21:00:00 via INTRAVENOUS

## 2012-03-19 NOTE — ED Notes (Signed)
While police at bedside, pt now states that he did not take any of the medications and he just lied because he and his wife have been having problems and he just wanted her to come to the hospital to be with him and now he is ready to leave.

## 2012-03-19 NOTE — ED Notes (Signed)
Requested patient remove his necklace.  Patient broke his necklace and threw it across room.  Necklace, phone, and clothing placed in bag and in secured closet.
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
## 2012-03-19 NOTE — ED Notes (Addendum)
Per EMS - patient had ativan filled on 12/22 with quantity of 90 pills in bottle. Bottle is empty and patient states he took approximately 80 pills of ativan about 1 1/2 hrs ago. 25 grams charcoal given by EMS en route to ED. Patient alert and oriented at triage. Patient also admits to drinking 3-40oz beers before arriving to ED.

## 2012-03-19 NOTE — ED Notes (Signed)
Wife visiting patient at bedside.  Pt states that he is leaving the ER and that he really did not take the medications that he stated that he took initially.  States he is going to have a lawsuit of everyone of [place].  Pt notified of his involuntary status and begins pulling off monitoring equipment, threw an item at staff, pulled out his IV access.  Security at bedside, police department contacted.

## 2012-03-19 NOTE — ED Notes (Signed)
Pt makes no eye contact or conversation with me at bedside.  Pt explained that we would be collecting urine and blood samples and performing an EKG, pt makes no comment to me at bedside.

## 2012-03-20 NOTE — ED Notes (Signed)
Telepsych consult completed. Pt cooperative at this time

## 2012-03-20 NOTE — Progress Notes (Signed)
2310 Assumed care/disposition of patient. He is here with IVC paperwork due to claim of taking pills for self harm.UDS negative. When wife arrived he changed his story and said he was doing this for attention and to get his wife up to the hospital.  Awaits tele-psych consultation.  1610 Tele psych consultation completed. IVC paperwork rescinded. Patient cleared to be discharged home.

## 2012-03-20 NOTE — ED Notes (Signed)
Pt relocated to rm 15 to be monitored by [employer]. Pt in shackles and yelling and cussing at deputy. Pt jerking at shackles. Deputy has asked him to stop.

## 2012-03-20 NOTE — ED Notes (Signed)
Pt was advised of discharge and belongings returned. Deputy offered and arranged transportation. Pt refused to sign stating "I ain't signin shit". Pt walked out refusing transport. Pt states he will walk home.

## 2012-03-20 NOTE — ED Notes (Signed)
Pt currently denying ingestion of pills with intent to harm self. Pt is not cooperative at this time.

## 2012-04-18 ENCOUNTER — Encounter (HOSPITAL_COMMUNITY): Payer: Self-pay

## 2012-04-18 ENCOUNTER — Emergency Department (HOSPITAL_COMMUNITY)
Admission: EM | Admit: 2012-04-18 | Discharge: 2012-04-18 | Discharge status: Against Medical Advice | Payer: Medicaid Other | Attending: Emergency Medicine | Admitting: Emergency Medicine

## 2012-04-18 DIAGNOSIS — E7800 Pure hypercholesterolemia, unspecified: Secondary | ICD-10-CM | POA: Insufficient documentation

## 2012-04-18 DIAGNOSIS — S0993XA Unspecified injury of face, initial encounter: Secondary | ICD-10-CM | POA: Insufficient documentation

## 2012-04-18 DIAGNOSIS — R45851 Suicidal ideations: Secondary | ICD-10-CM | POA: Insufficient documentation

## 2012-04-18 DIAGNOSIS — F17200 Nicotine dependence, unspecified, uncomplicated: Secondary | ICD-10-CM | POA: Insufficient documentation

## 2012-04-18 DIAGNOSIS — F411 Generalized anxiety disorder: Secondary | ICD-10-CM | POA: Insufficient documentation

## 2012-04-18 DIAGNOSIS — Y9389 Activity, other specified: Secondary | ICD-10-CM | POA: Insufficient documentation

## 2012-04-18 DIAGNOSIS — F329 Major depressive disorder, single episode, unspecified: Secondary | ICD-10-CM | POA: Insufficient documentation

## 2012-04-18 DIAGNOSIS — J329 Chronic sinusitis, unspecified: Secondary | ICD-10-CM | POA: Insufficient documentation

## 2012-04-18 DIAGNOSIS — R739 Hyperglycemia, unspecified: Secondary | ICD-10-CM

## 2012-04-18 DIAGNOSIS — J4489 Other specified chronic obstructive pulmonary disease: Secondary | ICD-10-CM | POA: Insufficient documentation

## 2012-04-18 DIAGNOSIS — Z79899 Other long term (current) drug therapy: Secondary | ICD-10-CM | POA: Insufficient documentation

## 2012-04-18 DIAGNOSIS — J449 Chronic obstructive pulmonary disease, unspecified: Secondary | ICD-10-CM | POA: Insufficient documentation

## 2012-04-18 DIAGNOSIS — K219 Gastro-esophageal reflux disease without esophagitis: Secondary | ICD-10-CM | POA: Insufficient documentation

## 2012-04-18 DIAGNOSIS — F3289 Other specified depressive episodes: Secondary | ICD-10-CM | POA: Insufficient documentation

## 2012-04-18 DIAGNOSIS — E1169 Type 2 diabetes mellitus with other specified complication: Secondary | ICD-10-CM | POA: Insufficient documentation

## 2012-04-18 DIAGNOSIS — Y92009 Unspecified place in unspecified non-institutional (private) residence as the place of occurrence of the external cause: Secondary | ICD-10-CM | POA: Insufficient documentation

## 2012-04-18 DIAGNOSIS — S199XXA Unspecified injury of neck, initial encounter: Secondary | ICD-10-CM | POA: Insufficient documentation

## 2012-04-18 DIAGNOSIS — R296 Repeated falls: Secondary | ICD-10-CM | POA: Insufficient documentation

## 2012-04-18 DIAGNOSIS — I10 Essential (primary) hypertension: Secondary | ICD-10-CM | POA: Insufficient documentation

## 2012-04-18 LAB — CBC WITH DIFFERENTIAL/PLATELET
Eosinophils Absolute: 0.2 10*3/uL (ref 0.0–0.7)
Eosinophils Relative: 2 % (ref 0–5)
Hemoglobin: 15.3 g/dL (ref 13.0–17.0)
Lymphocytes Relative: 28 % (ref 12–46)
Lymphs Abs: 2.4 10*3/uL (ref 0.7–4.0)
MCH: 30.6 pg (ref 26.0–34.0)
MCV: 83.4 fL (ref 78.0–100.0)
Monocytes Relative: 9 % (ref 3–12)
Platelets: 210 10*3/uL (ref 150–400)
RBC: 5 MIL/uL (ref 4.22–5.81)
WBC: 8.5 10*3/uL (ref 4.0–10.5)

## 2012-04-18 LAB — URINE MICROSCOPIC-ADD ON: Urine-Other: NONE SEEN

## 2012-04-18 LAB — URINALYSIS, ROUTINE W REFLEX MICROSCOPIC
Bilirubin Urine: NEGATIVE
Glucose, UA: >1000 mg/dL — AB
Hgb urine dipstick: NEGATIVE
Ketones, ur: NEGATIVE mg/dL
Leukocytes, UA: NEGATIVE
Nitrite: NEGATIVE
Protein, ur: NEGATIVE mg/dL
Specific Gravity, Urine: <1.005 — ABNORMAL LOW (ref 1.005–1.030)
Urobilinogen, UA: 0.2 mg/dL (ref 0.0–1.0)
pH: 6 (ref 5.0–8.0)

## 2012-04-18 LAB — BASIC METABOLIC PANEL
BUN: 8 mg/dL (ref 6–23)
CO2: 21 mEq/L (ref 19–32)
Calcium: 9.4 mg/dL (ref 8.4–10.5)
GFR calc non Af Amer: >90 mL/min (ref >90)
Glucose, Bld: 277 mg/dL — ABNORMAL HIGH (ref 70–99)
Potassium: 3.5 mEq/L (ref 3.5–5.1)
Sodium: 136 mEq/L (ref 135–145)

## 2012-04-18 LAB — ETHANOL: Alcohol, Ethyl (B): 37 mg/dL — ABNORMAL HIGH (ref 0–11)

## 2012-04-18 LAB — RAPID URINE DRUG SCREEN, HOSP PERFORMED
Barbiturates: POSITIVE — AB
Benzodiazepines: NOT DETECTED

## 2012-04-18 MED ORDER — SODIUM CHLORIDE 0.9 % IV SOLN: INTRAVENOUS | Status: DC

## 2012-04-18 MED ORDER — IBUPROFEN 400 MG PO TABS
400.0000 mg | ORAL_TABLET | Freq: Once | ORAL | Status: AC
  Administered 2012-04-18: 400 mg via ORAL
  Filled 2012-04-18: qty 1

## 2012-04-18 MED ORDER — SODIUM CHLORIDE 0.9 % IV BOLUS (SEPSIS)
2000.0000 mL | Freq: Once | INTRAVENOUS | Status: AC
  Administered 2012-04-18: 1000 mL via INTRAVENOUS

## 2012-04-18 NOTE — ED Notes (Signed)
Request pain med for patient, and isn't giving any further orders. Patient request IV out and to leave now.
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
## 2012-04-18 NOTE — ED Notes (Signed)
Pt states he was feeling dizzy tonight and while trying to get to the bathroom he fell.  Pt denies complaints from fall but states he is still dizzy and is also having sinus pain from congestion.

## 2012-05-17 ENCOUNTER — Encounter (HOSPITAL_COMMUNITY): Payer: Self-pay

## 2012-05-17 ENCOUNTER — Emergency Department (HOSPITAL_COMMUNITY): Payer: Medicaid Other

## 2012-05-17 ENCOUNTER — Emergency Department (HOSPITAL_COMMUNITY)
Admission: EM | Admit: 2012-05-17 | Discharge: 2012-05-17 | Disposition: A | Discharge status: Home / Self Care | Payer: Medicaid Other | Attending: Emergency Medicine | Admitting: Emergency Medicine

## 2012-05-17 DIAGNOSIS — J4489 Other specified chronic obstructive pulmonary disease: Secondary | ICD-10-CM | POA: Insufficient documentation

## 2012-05-17 DIAGNOSIS — E7800 Pure hypercholesterolemia, unspecified: Secondary | ICD-10-CM | POA: Insufficient documentation

## 2012-05-17 DIAGNOSIS — F329 Major depressive disorder, single episode, unspecified: Secondary | ICD-10-CM | POA: Insufficient documentation

## 2012-05-17 DIAGNOSIS — F17200 Nicotine dependence, unspecified, uncomplicated: Secondary | ICD-10-CM | POA: Insufficient documentation

## 2012-05-17 DIAGNOSIS — Y9389 Activity, other specified: Secondary | ICD-10-CM | POA: Insufficient documentation

## 2012-05-17 DIAGNOSIS — R269 Unspecified abnormalities of gait and mobility: Secondary | ICD-10-CM | POA: Insufficient documentation

## 2012-05-17 DIAGNOSIS — Y92410 Unspecified street and highway as the place of occurrence of the external cause: Secondary | ICD-10-CM | POA: Insufficient documentation

## 2012-05-17 DIAGNOSIS — S139XXA Sprain of joints and ligaments of unspecified parts of neck, initial encounter: Secondary | ICD-10-CM | POA: Insufficient documentation

## 2012-05-17 DIAGNOSIS — IMO0002 Reserved for concepts with insufficient information to code with codable children: Secondary | ICD-10-CM | POA: Insufficient documentation

## 2012-05-17 DIAGNOSIS — I10 Essential (primary) hypertension: Secondary | ICD-10-CM | POA: Insufficient documentation

## 2012-05-17 DIAGNOSIS — Z79899 Other long term (current) drug therapy: Secondary | ICD-10-CM | POA: Insufficient documentation

## 2012-05-17 DIAGNOSIS — F3289 Other specified depressive episodes: Secondary | ICD-10-CM | POA: Insufficient documentation

## 2012-05-17 DIAGNOSIS — E119 Type 2 diabetes mellitus without complications: Secondary | ICD-10-CM | POA: Insufficient documentation

## 2012-05-17 DIAGNOSIS — K219 Gastro-esophageal reflux disease without esophagitis: Secondary | ICD-10-CM | POA: Insufficient documentation

## 2012-05-17 DIAGNOSIS — S298XXA Other specified injuries of thorax, initial encounter: Secondary | ICD-10-CM | POA: Insufficient documentation

## 2012-05-17 DIAGNOSIS — R51 Headache: Secondary | ICD-10-CM | POA: Insufficient documentation

## 2012-05-17 DIAGNOSIS — F411 Generalized anxiety disorder: Secondary | ICD-10-CM | POA: Insufficient documentation

## 2012-05-17 MED ORDER — HYDROCODONE-ACETAMINOPHEN 5-325 MG PO TABS: 1.0000 | ORAL_TABLET | Freq: Four times a day (QID) | ORAL | Status: DC | PRN

## 2012-05-17 NOTE — ED Notes (Signed)
Patient refused to sign discharge papers .stated he was waiting on his ride and would sign them accordingly. Went back to room and patient had already left without signing but he did get  His discharge papers
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
## 2012-07-02 ENCOUNTER — Emergency Department (HOSPITAL_COMMUNITY): Payer: Medicaid Other

## 2012-07-02 ENCOUNTER — Encounter (HOSPITAL_COMMUNITY): Payer: Self-pay | Admitting: *Deleted

## 2012-07-02 ENCOUNTER — Emergency Department (HOSPITAL_COMMUNITY)
Admission: EM | Admit: 2012-07-02 | Discharge: 2012-07-02 | Disposition: A | Discharge status: Home / Self Care | Payer: Medicaid Other | Attending: Emergency Medicine | Admitting: Emergency Medicine

## 2012-07-02 DIAGNOSIS — S62639B Displaced fracture of distal phalanx of unspecified finger, initial encounter for open fracture: Secondary | ICD-10-CM

## 2012-07-02 DIAGNOSIS — K219 Gastro-esophageal reflux disease without esophagitis: Secondary | ICD-10-CM | POA: Insufficient documentation

## 2012-07-02 DIAGNOSIS — E119 Type 2 diabetes mellitus without complications: Secondary | ICD-10-CM | POA: Insufficient documentation

## 2012-07-02 DIAGNOSIS — F17200 Nicotine dependence, unspecified, uncomplicated: Secondary | ICD-10-CM | POA: Insufficient documentation

## 2012-07-02 DIAGNOSIS — E7800 Pure hypercholesterolemia, unspecified: Secondary | ICD-10-CM | POA: Insufficient documentation

## 2012-07-02 DIAGNOSIS — F411 Generalized anxiety disorder: Secondary | ICD-10-CM | POA: Insufficient documentation

## 2012-07-02 DIAGNOSIS — F3289 Other specified depressive episodes: Secondary | ICD-10-CM | POA: Insufficient documentation

## 2012-07-02 DIAGNOSIS — F329 Major depressive disorder, single episode, unspecified: Secondary | ICD-10-CM | POA: Insufficient documentation

## 2012-07-02 DIAGNOSIS — W230XXA Caught, crushed, jammed, or pinched between moving objects, initial encounter: Secondary | ICD-10-CM | POA: Insufficient documentation

## 2012-07-02 DIAGNOSIS — S61209A Unspecified open wound of unspecified finger without damage to nail, initial encounter: Secondary | ICD-10-CM | POA: Insufficient documentation

## 2012-07-02 DIAGNOSIS — J4489 Other specified chronic obstructive pulmonary disease: Secondary | ICD-10-CM | POA: Insufficient documentation

## 2012-07-02 DIAGNOSIS — Y9289 Other specified places as the place of occurrence of the external cause: Secondary | ICD-10-CM | POA: Insufficient documentation

## 2012-07-02 DIAGNOSIS — Z23 Encounter for immunization: Secondary | ICD-10-CM | POA: Insufficient documentation

## 2012-07-02 DIAGNOSIS — J449 Chronic obstructive pulmonary disease, unspecified: Secondary | ICD-10-CM | POA: Insufficient documentation

## 2012-07-02 DIAGNOSIS — R45851 Suicidal ideations: Secondary | ICD-10-CM | POA: Insufficient documentation

## 2012-07-02 DIAGNOSIS — S62639A Displaced fracture of distal phalanx of unspecified finger, initial encounter for closed fracture: Secondary | ICD-10-CM | POA: Insufficient documentation

## 2012-07-02 DIAGNOSIS — I10 Essential (primary) hypertension: Secondary | ICD-10-CM | POA: Insufficient documentation

## 2012-07-02 DIAGNOSIS — Y9389 Activity, other specified: Secondary | ICD-10-CM | POA: Insufficient documentation

## 2012-07-02 DIAGNOSIS — Z79899 Other long term (current) drug therapy: Secondary | ICD-10-CM | POA: Insufficient documentation

## 2012-07-02 MED ORDER — LIDOCAINE HCL (PF) 1 % IJ SOLN
5.0000 mL | Freq: Once | INTRAMUSCULAR | Status: AC
  Administered 2012-07-02: 5 mL via INTRADERMAL
  Filled 2012-07-02: qty 5

## 2012-07-02 MED ORDER — HYDROCODONE-ACETAMINOPHEN 5-325 MG PO TABS: ORAL_TABLET | ORAL | Status: DC

## 2012-07-02 MED ORDER — DOXYCYCLINE HYCLATE 100 MG PO TABS
100.0000 mg | ORAL_TABLET | Freq: Once | ORAL | Status: AC
  Administered 2012-07-02: 100 mg via ORAL
  Filled 2012-07-02: qty 1

## 2012-07-02 MED ORDER — TETANUS-DIPHTH-ACELL PERTUSSIS 5-2.5-18.5 LF-MCG/0.5 IM SUSP
0.5000 mL | Freq: Once | INTRAMUSCULAR | Status: AC
  Administered 2012-07-02: 0.5 mL via INTRAMUSCULAR
  Filled 2012-07-02: qty 0.5

## 2012-07-02 MED ORDER — DOXYCYCLINE HYCLATE 100 MG PO CAPS: 100.0000 mg | ORAL_CAPSULE | Freq: Two times a day (BID) | ORAL | Status: DC

## 2012-07-02 MED ORDER — OXYCODONE-ACETAMINOPHEN 5-325 MG PO TABS
1.0000 | ORAL_TABLET | Freq: Once | ORAL | Status: AC
  Administered 2012-07-02: 1 via ORAL
  Filled 2012-07-02: qty 1

## 2012-07-02 NOTE — ED Notes (Signed)
Left middle finger lac by a trailor hitch just PTA.
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
## 2012-07-05 NOTE — ED Provider Notes (Signed)
Medical screening examination/treatment/procedure(s) were performed by non-physician practitioner and as supervising physician I was immediately available for consultation/collaboration.  Rochel Privett, MD 07/05/12 1133 

## 2012-07-09 ENCOUNTER — Emergency Department (HOSPITAL_COMMUNITY)
Admission: EM | Admit: 2012-07-09 | Discharge: 2012-07-09 | Disposition: A | Discharge status: Home / Self Care | Payer: Medicaid Other | Attending: Emergency Medicine | Admitting: Emergency Medicine

## 2012-07-09 ENCOUNTER — Encounter (HOSPITAL_COMMUNITY): Payer: Self-pay

## 2012-07-09 ENCOUNTER — Emergency Department (HOSPITAL_COMMUNITY): Payer: Medicaid Other

## 2012-07-09 DIAGNOSIS — Z79899 Other long term (current) drug therapy: Secondary | ICD-10-CM | POA: Insufficient documentation

## 2012-07-09 DIAGNOSIS — F329 Major depressive disorder, single episode, unspecified: Secondary | ICD-10-CM | POA: Insufficient documentation

## 2012-07-09 DIAGNOSIS — Z8639 Personal history of other endocrine, nutritional and metabolic disease: Secondary | ICD-10-CM | POA: Insufficient documentation

## 2012-07-09 DIAGNOSIS — F411 Generalized anxiety disorder: Secondary | ICD-10-CM | POA: Insufficient documentation

## 2012-07-09 DIAGNOSIS — Y939 Activity, unspecified: Secondary | ICD-10-CM | POA: Insufficient documentation

## 2012-07-09 DIAGNOSIS — S6710XA Crushing injury of unspecified finger(s), initial encounter: Secondary | ICD-10-CM | POA: Insufficient documentation

## 2012-07-09 DIAGNOSIS — S6722XA Crushing injury of left hand, initial encounter: Secondary | ICD-10-CM

## 2012-07-09 DIAGNOSIS — J4489 Other specified chronic obstructive pulmonary disease: Secondary | ICD-10-CM | POA: Insufficient documentation

## 2012-07-09 DIAGNOSIS — Y929 Unspecified place or not applicable: Secondary | ICD-10-CM | POA: Insufficient documentation

## 2012-07-09 DIAGNOSIS — F3289 Other specified depressive episodes: Secondary | ICD-10-CM | POA: Insufficient documentation

## 2012-07-09 DIAGNOSIS — Z862 Personal history of diseases of the blood and blood-forming organs and certain disorders involving the immune mechanism: Secondary | ICD-10-CM | POA: Insufficient documentation

## 2012-07-09 DIAGNOSIS — J449 Chronic obstructive pulmonary disease, unspecified: Secondary | ICD-10-CM | POA: Insufficient documentation

## 2012-07-09 DIAGNOSIS — K219 Gastro-esophageal reflux disease without esophagitis: Secondary | ICD-10-CM | POA: Insufficient documentation

## 2012-07-09 DIAGNOSIS — Z8781 Personal history of (healed) traumatic fracture: Secondary | ICD-10-CM | POA: Insufficient documentation

## 2012-07-09 DIAGNOSIS — E119 Type 2 diabetes mellitus without complications: Secondary | ICD-10-CM | POA: Insufficient documentation

## 2012-07-09 DIAGNOSIS — I10 Essential (primary) hypertension: Secondary | ICD-10-CM | POA: Insufficient documentation

## 2012-07-09 DIAGNOSIS — F17200 Nicotine dependence, unspecified, uncomplicated: Secondary | ICD-10-CM | POA: Insufficient documentation

## 2012-07-09 MED ORDER — HYDROCODONE-ACETAMINOPHEN 5-325 MG PO TABS
1.0000 | ORAL_TABLET | ORAL | Status: DC | PRN
Start: 2012-07-09 — End: 2013-12-29
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
## 2012-07-09 NOTE — ED Notes (Signed)
Pt reports had sutures placed in left middle finger last week and accidentally bumped his finger again on car door this morning.

## 2012-07-09 NOTE — ED Provider Notes (Signed)
Medical screening examination/treatment/procedure(s) were performed by non-physician practitioner and as supervising physician I was immediately available for consultation/collaboration.   Glynn Octave, MD 07/09/12 [phone]

## 2013-12-29 ENCOUNTER — Emergency Department (HOSPITAL_COMMUNITY)
Admission: EM | Admit: 2013-12-29 | Discharge: 2013-12-29 | Disposition: A | Discharge status: Home / Self Care | Payer: Self-pay | Attending: Emergency Medicine | Admitting: Emergency Medicine

## 2013-12-29 ENCOUNTER — Encounter (HOSPITAL_COMMUNITY): Payer: Self-pay | Admitting: Emergency Medicine

## 2013-12-29 DIAGNOSIS — F419 Anxiety disorder, unspecified: Secondary | ICD-10-CM | POA: Insufficient documentation

## 2013-12-29 DIAGNOSIS — Z915 Personal history of self-harm: Secondary | ICD-10-CM | POA: Insufficient documentation

## 2013-12-29 DIAGNOSIS — E119 Type 2 diabetes mellitus without complications: Secondary | ICD-10-CM | POA: Insufficient documentation

## 2013-12-29 DIAGNOSIS — F329 Major depressive disorder, single episode, unspecified: Secondary | ICD-10-CM | POA: Insufficient documentation

## 2013-12-29 DIAGNOSIS — Z720 Tobacco use: Secondary | ICD-10-CM | POA: Insufficient documentation

## 2013-12-29 DIAGNOSIS — I10 Essential (primary) hypertension: Secondary | ICD-10-CM | POA: Insufficient documentation

## 2013-12-29 DIAGNOSIS — K219 Gastro-esophageal reflux disease without esophagitis: Secondary | ICD-10-CM | POA: Insufficient documentation

## 2013-12-29 DIAGNOSIS — J449 Chronic obstructive pulmonary disease, unspecified: Secondary | ICD-10-CM | POA: Insufficient documentation

## 2013-12-29 DIAGNOSIS — Z792 Long term (current) use of antibiotics: Secondary | ICD-10-CM | POA: Insufficient documentation

## 2013-12-29 DIAGNOSIS — G44019 Episodic cluster headache, not intractable: Secondary | ICD-10-CM | POA: Insufficient documentation

## 2013-12-29 DIAGNOSIS — Z79899 Other long term (current) drug therapy: Secondary | ICD-10-CM | POA: Insufficient documentation

## 2013-12-29 LAB — BASIC METABOLIC PANEL
Anion gap: 9 (ref 5–15)
BUN: 7 mg/dL (ref 6–23)
CHLORIDE: 103 meq/L (ref 96–112)
CO2: 27 mEq/L (ref 19–32)
CREATININE: 0.83 mg/dL (ref 0.50–1.35)
Calcium: 9.2 mg/dL (ref 8.4–10.5)
Glucose, Bld: 94 mg/dL (ref 70–99)
Potassium: 4.9 mEq/L (ref 3.7–5.3)
Sodium: 139 mEq/L (ref 137–147)

## 2013-12-29 LAB — CBC WITH DIFFERENTIAL/PLATELET
BASOS PCT: 0 % (ref 0–1)
Basophils Absolute: 0 10*3/uL (ref 0.0–0.1)
EOS ABS: 0.2 10*3/uL (ref 0.0–0.7)
EOS PCT: 3 % (ref 0–5)
HEMATOCRIT: 43.9 % (ref 39.0–52.0)
HEMOGLOBIN: 16.1 g/dL (ref 13.0–17.0)
Lymphocytes Relative: 30 % (ref 12–46)
Lymphs Abs: 1.5 10*3/uL (ref 0.7–4.0)
MCH: 31.4 pg (ref 26.0–34.0)
MCHC: 36.7 g/dL — AB (ref 30.0–36.0)
MCV: 85.6 fL (ref 78.0–100.0)
MONO ABS: 0.4 10*3/uL (ref 0.1–1.0)
MONOS PCT: 8 % (ref 3–12)
Neutro Abs: 3.1 10*3/uL (ref 1.7–7.7)
Neutrophils Relative %: 60 % (ref 43–77)
Platelets: 192 10*3/uL (ref 150–400)
RBC: 5.13 MIL/uL (ref 4.22–5.81)
RDW: 11.9 % (ref 11.5–15.5)
WBC: 5.2 10*3/uL (ref 4.0–10.5)

## 2013-12-29 MED ORDER — METOCLOPRAMIDE HCL 5 MG/ML IJ SOLN
10.0000 mg | Freq: Once | INTRAMUSCULAR | Status: AC
  Administered 2013-12-29: 10 mg via INTRAVENOUS
  Filled 2013-12-29: qty 2

## 2013-12-29 MED ORDER — DIPHENHYDRAMINE HCL 25 MG PO CAPS
25.0000 mg | ORAL_CAPSULE | Freq: Once | ORAL | Status: AC
  Administered 2013-12-29: 25 mg via ORAL
  Filled 2013-12-29: qty 1

## 2013-12-29 MED ORDER — SODIUM CHLORIDE 0.9 % IV SOLN
Freq: Once | INTRAVENOUS | Status: AC
  Administered 2013-12-29: 12:00:00 via INTRAVENOUS

## 2013-12-29 MED ORDER — KETOROLAC TROMETHAMINE 30 MG/ML IJ SOLN
30.0000 mg | Freq: Once | INTRAMUSCULAR | Status: AC
  Administered 2013-12-29: 30 mg via INTRAVENOUS
  Filled 2013-12-29: qty 1

## 2013-12-29 MED ORDER — IBUPROFEN 800 MG PO TABS: 800.0000 mg | ORAL_TABLET | Freq: Three times a day (TID) | ORAL | Status: DC

## 2013-12-29 NOTE — ED Notes (Addendum)
Pt states, "I've been having problems with my blood pressure". Pt states headache and dizziness also, which began last night. Pt states PCP took him off of BP meds 2 years ago. Also states body aches.

## 2013-12-29 NOTE — Discharge Instructions (Signed)
Cluster Headache Cluster headaches are deeply painful. They normally occur on one side of your head, but they may switch sides. Often, cluster headaches:  Are severe.  Happen often for a few weeks or months and then go away for a while.  Last from 15 minutes to 3 hours.  Happen at the same time each day.  Happen at night.  Happen many times a day. HOME CARE  During times when you have cluster headaches:  Get the same amount of sleep every night, at the same time each night.  Avoid alcohol.  Stop smoking if you smoke. GET HELP IF:  There are changes in how bad or how often your headaches happen.  Your medicines are not helping. GET HELP RIGHT AWAY IF:  You pass out (faint).  You become weak or lose feeling (have numbness) on one side of your body or face.  You see two of everything (double vision).  You feel sick to your stomach (nauseous) or throw up (vomit) and do not stop after several hours.  You are off balance or have trouble talking or walking.  You have neck pain or stiffness.  You have a fever. MAKE SURE YOU:  Understand these instructions.  Will watch your condition.  Will get help right away if you are not doing well or get worse. Document Released: 04/19/2004 Document Revised: 03/17/2013 Document Reviewed: 10/02/2012 [employer] Patient Information 2015 [employer]. This information is not intended to replace advice given to you by your health care provider. Make sure you discuss any questions you have with your health care provider.  

## 2013-12-29 NOTE — ED Provider Notes (Signed)
CSN: [id number]     Arrival date & time 12/29/13  1102 History   This patient was seen in room APA09/APA09 and the patient's care was started at 11:32 AM.   None    Chief Complaint  Patient presents with  . Headache    HPI HPI Comments: Willie Herman is a 38 y.o. male, with a h/o HTN, who presents to the Emergency Department complaining of a headache, onset yesterday evening, which has been associated with dizziness upon position change. The pt's PCP stopped his HTN medication two years ago after losing weight. He reports h/o frequent headaches that are sporadic in onset.  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  He states this headache is similar to previous headaches.  He reports some nausea.  He denies fever, neck pain or stiffness, vomiting, visual changes or numbness or weakness of the extremities.      Past Medical History  Diagnosis Date  . Hypertension   . GERD (gastroesophageal reflux disease)   . COPD (chronic obstructive pulmonary disease)   . Depression   . High cholesterol   . Anxiety   . Suicidal ideation   . diet controlled diabetes     former   Past Surgical History  Procedure Laterality Date  . Appendectomy    . Abdominal surgery    . Meniscus repair    . Vascular surgery    . Leg surgery      when 12. Patient reports he was in tractor accident and leg had to be repaired   No family history on file. History  Substance Use Topics  . Smoking status: Current Every Day Smoker -- 1.50 packs/day    Types: Cigarettes  . Smokeless tobacco: Not on file  . Alcohol Use: Yes     Comment: occ    Review of Systems  Constitutional: Negative for fever, activity change and appetite change.  HENT: Negative for facial swelling and trouble swallowing.   Eyes:  Positive for photophobia. Negative for pain and visual disturbance.  Respiratory: Negative for chest tightness and shortness of breath.   Cardiovascular: Negative for chest pain.  Gastrointestinal: Negative for nausea, vomiting and abdominal pain.  Genitourinary: Negative for dysuria.  Musculoskeletal: Negative for neck pain and neck stiffness.  Skin: Negative for rash and wound.  Neurological: Positive for dizziness and headaches. Negative for syncope, facial asymmetry, speech difficulty, weakness and numbness.  Psychiatric/Behavioral: Negative for confusion and decreased concentration.  All other systems reviewed and are negative.     Allergies  Pineapple  Home Medications   Prior to Admission medications   Medication Sig Start Date End Date Taking? Authorizing Provider  albuterol (PROAIR HFA) 108 (90 BASE) MCG/ACT inhaler Inhale 2 puffs into the lungs every 6 (six) hours as needed.    Historical Provider, MD  ALPRAZolam Prudy Feeler) 1 MG tablet Take 1 mg by mouth 3 (three) times daily as needed for sleep or anxiety.    Historical Provider, MD  amLODipine (NORVASC) 5 MG tablet Take 5 mg by mouth daily.    Historical Provider, MD  Azelastine-Fluticasone (DYMISTA) 137-50 MCG/ACT SUSP Place 2 sprays into the nose daily.    Historical Provider, MD  benazepril (LOTENSIN) 20 MG tablet Take 20 mg by mouth daily.  Historical Provider, MD  buPROPion (WELLBUTRIN) 100 MG tablet Take 100 mg by mouth 2 (two) times daily. *Titration of taking one tablet daily for 7 days*    Historical Provider, MD  doxycycline (VIBRAMYCIN) 100 MG capsule Take 1 capsule (100 mg total) by mouth 2 (two) times daily. 07/02/12   Trestan Vahle L. Stepan Verrette, PA-C  escitalopram (LEXAPRO) 20 MG tablet Take 20 mg by mouth daily.     Historical Provider, MD  HYDROcodone-acetaminophen (NORCO/VICODIN) 5-325 MG per tablet Take 1 tablet by mouth every 6 (six) hours as needed for pain. 05/17/12   Benny Lennert, MD  HYDROcodone-acetaminophen  (NORCO/VICODIN) 5-325 MG per tablet Take one-two tabs po q 4-6 hrs prn pain 07/02/12   Iowa Kappes L. Fady Stamps, PA-C  HYDROcodone-acetaminophen (NORCO/VICODIN) 5-325 MG per tablet Take 1 tablet by mouth every 4 (four) hours as needed for pain. 07/09/12   Burgess Amor, PA-C  LORazepam (ATIVAN) 0.5 MG tablet Take 0.5 mg by mouth 3 (three) times daily.    Historical Provider, MD  omeprazole (PRILOSEC) 40 MG capsule Take 40 mg by mouth daily.    Historical Provider, MD  PRESCRIPTION MEDICATION Take 1 tablet by mouth at bedtime.    Historical Provider, MD  tiotropium (SPIRIVA) 18 MCG inhalation capsule Place 18 mcg into inhaler and inhale daily.    Historical Provider, MD  zolpidem (AMBIEN) 10 MG tablet Take 10 mg by mouth at bedtime.    Historical Provider, MD   Triage Vitals: BP 131/74  Pulse 69  Temp(Src) 98.3 F (36.8 C) (Oral)  Resp 16  Ht 5\' 11"  (1.803 m)  Wt 211 lb (95.709 kg)  BMI 29.44 kg/m2  SpO2 99%  Physical Exam  Nursing note and vitals reviewed. Constitutional: He is oriented to person, place, and time. He appears well-developed and well-nourished. No distress.  HENT:  Head: Normocephalic and atraumatic.  Mouth/Throat: Oropharynx is clear and moist.  Eyes: Conjunctivae and EOM are normal. Pupils are equal, round, and reactive to light.  Neck: Normal range of motion, full passive range of motion without pain and phonation normal. Neck supple. No spinous process tenderness and no muscular tenderness present. No rigidity. No Kernig's sign noted.  Cardiovascular: Normal rate, regular rhythm, normal heart sounds and intact distal pulses.   No murmur heard. Pulmonary/Chest: Effort normal and breath sounds normal. No respiratory distress.  Abdominal: Soft. Bowel sounds are normal.  Musculoskeletal: Normal range of motion.  Neurological: He is alert and oriented to person, place, and time. He has normal strength. No cranial nerve deficit or sensory deficit. He exhibits normal muscle tone.  Coordination and gait normal. GCS eye subscore is 4. GCS verbal subscore is 5. GCS motor subscore is 6.  Reflex Scores:      Tricep reflexes are 2+ on the right side and 2+ on the left side.      Bicep reflexes are 2+ on the right side and 2+ on the left side. Skin: Skin is warm and dry.  Psychiatric: He has a normal mood and affect. His behavior is normal.    ED Course  Procedures (including critical care time)  DIAGNOSTIC STUDIES: Oxygen Saturation is 99% on room air, normal by my interpretation.    COORDINATION OF CARE:  11:32 AM- Discussed treatment plan with patient, and the patient agreed to the plan.   Labs Review Labs Reviewed  CBC WITH DIFFERENTIAL - Abnormal; Notable for the following:    MCHC 36.7 (*)    All other components within normal limits  BASIC METABOLIC PANEL    Imaging Review No results found.   EKG Interpretation None      MDM   Final diagnoses:  Episodic cluster headache, not intractable    Orthostatic VS for the past 24 hrs:  BP- Lying Pulse- Lying BP- Sitting Pulse- Sitting BP- Standing at 0 minutes Pulse- Standing at 0 minutes  12/29/13 1244 - - - 63 - -  12/29/13 1240 126/81 mmHg 65 124/84 mmHg - 122/83 mmHg 70     Patient is feeling better after IV medications and fluids. No focal neuro deficits on exam. No meningeal signs. Patient is well-appearing and nontoxic. Patient also seen by Dr. Adriana Simasook and care plan discussed.  Previous history of frequent headaches and reports that today's headache is similar to previous. Have discussed with patient possibility of cluster headaches. He agrees to close followup with his primary care physician or to return to the ER for any worsening symptoms. He appears stable for discharge.     Manasseh Pittsley L. Reggie Bise, PA-C 12/31/13 1652

## 2014-01-02 NOTE — ED Provider Notes (Signed)
Medical screening examination/treatment/procedure(s) were conducted as a shared visit with non-physician practitioner(s) and myself.  I personally evaluated the patient during the encounter.   EKG Interpretation None     No neurological deficits. Blood pressure normal.  Patient feels better after IV fluids and pain medication  Donnetta HutchingBrian Garvey Westcott, MD 01/02/14 1059

## 2014-02-27 ENCOUNTER — Encounter (HOSPITAL_COMMUNITY): Payer: Self-pay | Admitting: *Deleted

## 2014-02-27 ENCOUNTER — Emergency Department (HOSPITAL_COMMUNITY): Payer: Self-pay

## 2014-02-27 ENCOUNTER — Emergency Department (HOSPITAL_COMMUNITY)
Admission: EM | Admit: 2014-02-27 | Discharge: 2014-02-27 | Disposition: A | Discharge status: Home / Self Care | Payer: Self-pay | Attending: Emergency Medicine | Admitting: Emergency Medicine

## 2014-02-27 DIAGNOSIS — R0789 Other chest pain: Secondary | ICD-10-CM

## 2014-02-27 DIAGNOSIS — E119 Type 2 diabetes mellitus without complications: Secondary | ICD-10-CM | POA: Insufficient documentation

## 2014-02-27 DIAGNOSIS — R079 Chest pain, unspecified: Secondary | ICD-10-CM

## 2014-02-27 DIAGNOSIS — Z720 Tobacco use: Secondary | ICD-10-CM | POA: Insufficient documentation

## 2014-02-27 DIAGNOSIS — Z8659 Personal history of other mental and behavioral disorders: Secondary | ICD-10-CM | POA: Insufficient documentation

## 2014-02-27 DIAGNOSIS — F419 Anxiety disorder, unspecified: Secondary | ICD-10-CM | POA: Insufficient documentation

## 2014-02-27 DIAGNOSIS — I10 Essential (primary) hypertension: Secondary | ICD-10-CM | POA: Insufficient documentation

## 2014-02-27 DIAGNOSIS — Z8719 Personal history of other diseases of the digestive system: Secondary | ICD-10-CM | POA: Insufficient documentation

## 2014-02-27 DIAGNOSIS — J441 Chronic obstructive pulmonary disease with (acute) exacerbation: Secondary | ICD-10-CM | POA: Insufficient documentation

## 2014-02-27 MED ORDER — METHOCARBAMOL 500 MG PO TABS
500.0000 mg | ORAL_TABLET | Freq: Three times a day (TID) | ORAL | Status: DC
Start: 2014-02-27 — End: 2015-05-02

## 2014-02-27 MED ORDER — IBUPROFEN 800 MG PO TABS: 800.0000 mg | ORAL_TABLET | Freq: Three times a day (TID) | ORAL | Status: DC

## 2014-02-27 MED ORDER — METHOCARBAMOL 500 MG PO TABS
1000.0000 mg | ORAL_TABLET | Freq: Once | ORAL | Status: AC
  Administered 2014-02-27: 1000 mg via ORAL
  Filled 2014-02-27: qty 2

## 2014-02-27 MED ORDER — HYDROCODONE-ACETAMINOPHEN 5-325 MG PO TABS: 1.0000 | ORAL_TABLET | ORAL | Status: DC | PRN

## 2014-02-27 MED ORDER — KETOROLAC TROMETHAMINE 10 MG PO TABS
10.0000 mg | ORAL_TABLET | Freq: Once | ORAL | Status: AC
  Administered 2014-02-27: 10 mg via ORAL
  Filled 2014-02-27: qty 1

## 2014-02-27 MED ORDER — HYDROCODONE-ACETAMINOPHEN 5-325 MG PO TABS
2.0000 | ORAL_TABLET | Freq: Once | ORAL | Status: AC
  Administered 2014-02-27: 2 via ORAL
  Filled 2014-02-27: qty 2

## 2014-02-27 NOTE — ED Notes (Signed)
No bruising, bony prominence or crepitus palpated over thorax.  Lungs clear bilaterally.

## 2014-02-27 NOTE — ED Notes (Signed)
Willie Herman was pulling a steel cable when he heard a loud pop in L lateral chest.  Has had mild pain with rest and sharp pain with coughing or deep breathing since then.  Ibuprofen has not helped.

## 2014-02-27 NOTE — Discharge Instructions (Signed)
The x-ray of your chest and ribs is negative for any fracture, dislocation, or lung injury. Please practice coughing and deep breathing several times during the day to keep your lungs well expanded. Please use medications as prescribed. Robaxin and Norco may cause drowsiness, please do not drink alcohol, drive, operate machinery, or participate in Chest Wall Pain Chest wall pain is pain in or around the bones and muscles of your chest. It may take up to 6 weeks to get better. It may take longer if you must stay physically active in your work and activities.  CAUSES  Chest wall pain may happen on its own. However, it may be caused by:  A viral illness like the flu.  Injury.  Coughing.  Exercise.  Arthritis.  Fibromyalgia.  Shingles. HOME CARE INSTRUCTIONS   Avoid overtiring physical activity. Try not to strain or perform activities that cause pain. This includes any activities using your chest or your abdominal and side muscles, especially if heavy weights are used.  Put ice on the sore area.  Put ice in a plastic bag.  Place a towel between your skin and the bag.  Leave the ice on for 15-20 minutes per hour while awake for the first 2 days.  Only take over-the-counter or prescription medicines for pain, discomfort, or fever as directed by your caregiver. SEEK IMMEDIATE MEDICAL CARE IF:   Your pain increases, or you are very uncomfortable.  You have a fever.  Your chest pain becomes worse.  You have new, unexplained symptoms.  You have nausea or vomiting.  You feel sweaty or lightheaded.  You have a cough with phlegm (sputum), or you cough up blood. MAKE SURE YOU:   Understand these instructions.  Will watch your condition.  Will get help right away if you are not doing well or get worse. Document Released: 03/12/2005 Document Revised: 06/04/2011 Document Reviewed: 11/06/2010 [facility] Patient Information 2015 [employer], [place]. This information is not intended to  replace advice given to you by your health care provider. Make sure you discuss any questions you have with your health care provider.    activities requiring concentration when taking these medications.

## 2014-02-27 NOTE — ED Provider Notes (Signed)
CSN: [id number]     Arrival date & time 02/27/14  1041 History   First MD Initiated Contact with Patient 02/27/14 1118     Chief Complaint  Patient presents with  . rib pain      (Consider location/radiation/quality/duration/timing/severity/associated sxs/prior Treatment) HPI Comments: Patient states that on yesterday December 4, about mid morning he was pulling a cable when the cable struck his left chest. The patient states he heard a loud pop and it had some pain with that area following the incident. He states that now he has pain with cough and with deep breathing. He's not had any hemoptysis reported. There's been no previous operations or procedures involving the lungs. The patient states that he was previously diagnosed with chronic obstructive pulmonary disease, but that he rarely has problems with this at this time. The been no high fevers reported and no other injury noted.    The history is provided by the patient.    Past Medical History  Diagnosis Date  . Hypertension   . GERD (gastroesophageal reflux disease)   . COPD (chronic obstructive pulmonary disease)   . Depression   . High cholesterol   . Anxiety   . Suicidal ideation   . diet controlled diabetes     former   Past Surgical History  Procedure Laterality Date  . Appendectomy    . Abdominal surgery    . Meniscus repair    . Vascular surgery    . Leg surgery      when 12. Patient reports he was in tractor accident and leg had to be repaired   History reviewed. No pertinent family history. History  Substance Use Topics  . Smoking status: Current Every Day Smoker -- 1.50 packs/day    Types: Cigarettes  . Smokeless tobacco: Not on file  . Alcohol Use: Yes     Comment: occ    Review of Systems  Constitutional: Negative for activity change.       All ROS Neg except as noted in HPI  Eyes: Negative for photophobia and discharge.  Respiratory: Positive for wheezing. Negative for cough, shortness of  breath and stridor.   Cardiovascular: Negative for chest pain and palpitations.  Gastrointestinal: Negative for abdominal pain and blood in stool.  Genitourinary: Negative for dysuria, frequency and hematuria.  Musculoskeletal: Negative for back pain, arthralgias and neck pain.  Skin: Negative.   Neurological: Negative for dizziness, seizures and speech difficulty.  Psychiatric/Behavioral: Negative for hallucinations and confusion. The patient is nervous/anxious.       Allergies  Pineapple  Home Medications   Prior to Admission medications   Medication Sig Start Date End Date Taking? Authorizing Provider  ibuprofen (ADVIL,MOTRIN) 800 MG tablet Take 1 tablet (800 mg total) by mouth 3 (three) times daily. 12/29/13   Tammy L. Triplett, PA-C   BP 136/88 mmHg  Pulse 77  Temp(Src) 98 F (36.7 C)  Resp 16  Ht 6' (1.829 m)  Wt 213 lb (96.616 kg)  BMI 28.88 kg/m2  SpO2 100% Physical Exam  Constitutional: He is oriented to person, place, and time. He appears well-developed and well-nourished.  Non-toxic appearance.  HENT:  Head: Normocephalic.  Right Ear: Tympanic membrane and external ear normal.  Left Ear: Tympanic membrane and external ear normal.  Eyes: EOM and lids are normal. Pupils are equal, round, and reactive to light.  Neck: Normal range of motion. Neck supple. Carotid bruit is not present.  Cardiovascular: Normal rate, regular rhythm, normal heart sounds,  intact distal pulses and normal pulses.   Pulmonary/Chest: Breath sounds normal. No respiratory distress.  There is symmetrical rise and fall of the chest. The patient speaks in complete sentences without problem.  There is soreness to palpation of the left anterior lateral chest wall. There is no noted bruising appreciated. No palpable deformity. No crepitus noted. Course breath sounds present on lung examination. No rales, wheezes appreciated.  Abdominal: Soft. Bowel sounds are normal. There is no tenderness. There is  no guarding.  Musculoskeletal: Normal range of motion.  Lymphadenopathy:       Head (right side): No submandibular adenopathy present.       Head (left side): No submandibular adenopathy present.    He has no cervical adenopathy.  Neurological: He is alert and oriented to person, place, and time. He has normal strength. No cranial nerve deficit or sensory deficit.  Skin: Skin is warm and dry.  Psychiatric: He has a normal mood and affect. His speech is normal.  Nursing note and vitals reviewed.   ED Course  Procedures (including critical care time) Labs Review Labs Reviewed - No data to display  Imaging Review Dg Ribs Unilateral W/chest Left  02/27/2014   CLINICAL DATA:  Acute chest pain acute left lower rib pain, injury  EXAM: LEFT RIBS AND CHEST - 3+ VIEW  COMPARISON:  05/17/2012  FINDINGS: No fracture or other bone lesions are seen involving the ribs. There is no evidence of pneumothorax or pleural effusion. Both lungs are clear. Heart size and mediastinal contours are within normal limits.  IMPRESSION: Negative.   Electronically Signed   By: Ruel Favorsrevor  Shick M.D.   On: 02/27/2014 11:46     EKG Interpretation None      MDM  Vital signs are well within normal limits. Pulse oximetry is 100% on room air. Within normal limits by my interpretation. X-ray of the chest and left rib are negative for any acute problem. Patient speaks in complete sentences. He does have pain with certain movement and with cough.  The plan at this time is for the patient to exercise cough and deep breathing frequently. Prescription for Robaxin and diclofenac given to the patient.    Final diagnoses:  Chest pain    *I have reviewed nursing notes, vital signs, and all appropriate lab and imaging results for this patient.    Kathie DikeHobson M Colyn Miron, PA-C 02/27/14 1231  Hilario Quarryanielle S Ray, MD 02/27/14 [id number]

## 2014-02-27 NOTE — ED Notes (Signed)
Patient with no complaints at this time. Respirations even and unlabored. Skin warm/dry. Discharge instructions reviewed with patient at this time. Patient given opportunity to voice concerns/ask questions. Patient discharged at this time and left Emergency Department with steady gait.   

## 2014-08-27 ENCOUNTER — Emergency Department (HOSPITAL_COMMUNITY)
Admission: EM | Admit: 2014-08-27 | Discharge: 2014-08-27 | Disposition: A | Discharge status: Home / Self Care | Payer: Self-pay | Attending: Emergency Medicine | Admitting: Emergency Medicine

## 2014-08-27 ENCOUNTER — Encounter (HOSPITAL_COMMUNITY): Payer: Self-pay | Admitting: Emergency Medicine

## 2014-08-27 ENCOUNTER — Emergency Department (HOSPITAL_COMMUNITY): Payer: Self-pay

## 2014-08-27 DIAGNOSIS — Z8659 Personal history of other mental and behavioral disorders: Secondary | ICD-10-CM | POA: Insufficient documentation

## 2014-08-27 DIAGNOSIS — Z8719 Personal history of other diseases of the digestive system: Secondary | ICD-10-CM | POA: Insufficient documentation

## 2014-08-27 DIAGNOSIS — Z8639 Personal history of other endocrine, nutritional and metabolic disease: Secondary | ICD-10-CM | POA: Insufficient documentation

## 2014-08-27 DIAGNOSIS — J441 Chronic obstructive pulmonary disease with (acute) exacerbation: Secondary | ICD-10-CM | POA: Insufficient documentation

## 2014-08-27 DIAGNOSIS — I10 Essential (primary) hypertension: Secondary | ICD-10-CM | POA: Insufficient documentation

## 2014-08-27 DIAGNOSIS — J9801 Acute bronchospasm: Secondary | ICD-10-CM

## 2014-08-27 DIAGNOSIS — Z79899 Other long term (current) drug therapy: Secondary | ICD-10-CM | POA: Insufficient documentation

## 2014-08-27 DIAGNOSIS — R0602 Shortness of breath: Secondary | ICD-10-CM

## 2014-08-27 DIAGNOSIS — Z792 Long term (current) use of antibiotics: Secondary | ICD-10-CM | POA: Insufficient documentation

## 2014-08-27 DIAGNOSIS — Z720 Tobacco use: Secondary | ICD-10-CM | POA: Insufficient documentation

## 2014-08-27 MED ORDER — ALBUTEROL SULFATE (5 MG/ML) 0.5% IN NEBU: 2.5000 mg | INHALATION_SOLUTION | Freq: Four times a day (QID) | RESPIRATORY_TRACT | Status: DC | PRN

## 2014-08-27 MED ORDER — PREDNISONE 50 MG PO TABS
60.0000 mg | ORAL_TABLET | Freq: Once | ORAL | Status: AC
  Administered 2014-08-27: 60 mg via ORAL
  Filled 2014-08-27 (×2): qty 1

## 2014-08-27 MED ORDER — ALBUTEROL (5 MG/ML) CONTINUOUS INHALATION SOLN
10.0000 mg/h | INHALATION_SOLUTION | Freq: Once | RESPIRATORY_TRACT | Status: AC
  Administered 2014-08-27: 10 mg/h via RESPIRATORY_TRACT
  Filled 2014-08-27: qty 20

## 2014-08-27 MED ORDER — ALBUTEROL SULFATE HFA 108 (90 BASE) MCG/ACT IN AERS: 1.0000 | INHALATION_SPRAY | Freq: Four times a day (QID) | RESPIRATORY_TRACT | Status: DC | PRN

## 2014-08-27 MED ORDER — PREDNISONE 20 MG PO TABS: 20.0000 mg | ORAL_TABLET | Freq: Two times a day (BID) | ORAL | Status: DC

## 2014-08-27 MED ORDER — BENZONATATE 100 MG PO CAPS: 100.0000 mg | ORAL_CAPSULE | Freq: Three times a day (TID) | ORAL | Status: DC

## 2014-08-27 MED ORDER — DOXYCYCLINE HYCLATE 100 MG PO CAPS: 100.0000 mg | ORAL_CAPSULE | Freq: Two times a day (BID) | ORAL | Status: DC

## 2014-08-27 NOTE — ED Notes (Signed)
Pt reports generalized body aches,productive cough,n/v/d. nad noted.

## 2014-08-27 NOTE — ED Provider Notes (Signed)
CSN: [id number]     Arrival date & time 08/27/14  1032 History  This chart was scribed for Rolland Porter, MD by Elveria Rising, ED scribe.  This patient was seen in room APA09/APA09 and the patient's care was started at 10:53 AM.   Chief Complaint  Patient presents with  . Generalized Body Aches   The history is provided by the patient. No language interpreter was used.   HPI Comments: Willie Herman is a 39 y.o. male who presents to the Emergency Department complaining of cold symptoms including persistent cough with post tussive emesis and generalized myalgias onset three weeks ago. Patient reports onset of diarrhea last night; patient denies nausea, abdominal pain. Patient reports attempted treatment with several OTC medications without relief or improvement. Patient denies previous evaluation for his symptoms; states this is the first day he's had to miss work presumably prompting his ED visit. Patient is an admitted smoker; he shares history of COPD.   Past Medical History  Diagnosis Date  . Hypertension   . GERD (gastroesophageal reflux disease)   . COPD (chronic obstructive pulmonary disease)   . Depression   . High cholesterol   . Anxiety   . Suicidal ideation   . diet controlled diabetes     former   Past Surgical History  Procedure Laterality Date  . Appendectomy    . Abdominal surgery    . Meniscus repair    . Vascular surgery    . Leg surgery      when 12. Patient reports he was in tractor accident and leg had to be repaired   History reviewed. No pertinent family history. History  Substance Use Topics  . Smoking status: Current Every Day Smoker -- 1.50 packs/day    Types: Cigarettes  . Smokeless tobacco: Not on file  . Alcohol Use: Yes     Comment: occ    Review of Systems  Constitutional: Negative for fever, chills, diaphoresis, appetite change and fatigue.  HENT: Positive for congestion. Negative for mouth sores, sore throat and trouble swallowing.   Eyes: Negative  for visual disturbance.  Respiratory: Positive for cough and wheezing. Negative for chest tightness and shortness of breath.   Cardiovascular: Negative for chest pain.  Gastrointestinal: Positive for diarrhea. Negative for nausea, vomiting, abdominal pain and abdominal distention.  Endocrine: Negative for polydipsia, polyphagia and polyuria.  Genitourinary: Negative for dysuria, frequency and hematuria.  Musculoskeletal: Positive for myalgias. Negative for gait problem.  Skin: Negative for color change, pallor and rash.  Neurological: Negative for dizziness, syncope, light-headedness and headaches.  Hematological: Does not bruise/bleed easily.  Psychiatric/Behavioral: Negative for behavioral problems and confusion.      Allergies  Pineapple  Home Medications   Prior to Admission medications   Medication Sig Start Date End Date Taking? Authorizing Provider  Pseudoeph-Doxylamine-DM-APAP (NYQUIL MULTI-SYMPTOM PO) Take 2 capsules by mouth at bedtime as needed (cold/cough symptoms).   Yes Historical Provider, MD  Pseudoephedrine-APAP-DM (DAYQUIL MULTI-SYMPTOM COLD/FLU PO) Take 2 capsules by mouth daily as needed (cold/cough symptoms).   Yes Historical Provider, MD  albuterol (PROVENTIL HFA;VENTOLIN HFA) 108 (90 BASE) MCG/ACT inhaler Inhale 1-2 puffs into the lungs every 6 (six) hours as needed for wheezing. 08/27/14   Rolland Porter, MD  benzonatate (TESSALON) 100 MG capsule Take 1 capsule (100 mg total) by mouth every 8 (eight) hours. 08/27/14   Rolland Porter, MD  doxycycline (VIBRAMYCIN) 100 MG capsule Take 1 capsule (100 mg total) by mouth 2 (two) times daily. 08/27/14  Rolland PorterMark Renell Allum, MD  HYDROcodone-acetaminophen (NORCO/VICODIN) 5-325 MG per tablet Take 1 tablet by mouth every 4 (four) hours as needed. Patient not taking: Reported on 08/27/2014 02/27/14   Ivery QualeHobson Bryant, PA-C  ibuprofen (ADVIL,MOTRIN) 800 MG tablet Take 1 tablet (800 mg total) by mouth 3 (three) times daily. Patient not taking: Reported on  08/27/2014 02/27/14   Ivery QualeHobson Bryant, PA-C  methocarbamol (ROBAXIN) 500 MG tablet Take 1 tablet (500 mg total) by mouth 3 (three) times daily. Patient not taking: Reported on 08/27/2014 02/27/14   Ivery QualeHobson Bryant, PA-C  predniSONE (DELTASONE) 20 MG tablet Take 1 tablet (20 mg total) by mouth 2 (two) times daily with a meal. 08/27/14   Rolland PorterMark Francys Bolin, MD   Triage Vitals: BP 142/85 mmHg  Pulse 66  Temp(Src) 98.3 F (36.8 C) (Oral)  Resp 18  Ht 5\' 11"  (1.803 m)  Wt 220 lb (99.791 kg)  BMI 30.70 kg/m2  SpO2 96% Physical Exam  Constitutional: He is oriented to person, place, and time. He appears well-developed and well-nourished. No distress.  HENT:  Head: Normocephalic.  Eyes: Conjunctivae are normal. Pupils are equal, round, and reactive to light. No scleral icterus.  Neck: Normal range of motion. Neck supple. No thyromegaly present.  Cardiovascular: Normal rate and regular rhythm.  Exam reveals no gallop and no friction rub.   No murmur heard. Pulmonary/Chest: Effort normal and breath sounds normal. No respiratory distress. He has no wheezes. He has no rales.  Wheezing in all fields. Prolongations in all fields.   Abdominal: Soft. Bowel sounds are normal. He exhibits no distension. There is no tenderness. There is no rebound.  Musculoskeletal: Normal range of motion.  Neurological: He is alert and oriented to person, place, and time.  Skin: Skin is warm and dry. No rash noted.  Psychiatric: He has a normal mood and affect. His behavior is normal.    ED Course  Procedures (including critical care time)  COORDINATION OF CARE: 10:59 AM- Plans to administer breathing treatment and obtain CXR. Discussed treatment plan with patient at bedside and patient agreed to plan.   Labs Review Labs Reviewed - No data to display  Imaging Review Dg Chest 2 View  08/27/2014   CLINICAL DATA:  Shortness of breath.  EXAM: CHEST  2 VIEW  COMPARISON:  February 27, 2014.  FINDINGS: The heart size and mediastinal  contours are within normal limits. Both lungs are clear. No pneumothorax or pleural effusion is noted. The visualized skeletal structures are unremarkable.  IMPRESSION: No active cardiopulmonary disease.   Electronically Signed   By: Lupita RaiderJames  Green Jr, M.D.   On: 08/27/2014 11:50     EKG Interpretation None      MDM   Final diagnoses:  SOB (shortness of breath)  COPD exacerbation  Bronchospasm    Patient given immunized epidural over 1 hour. Reexam shows improving aeration. He has no cough while here. Chest x-ray shows no fluid no infiltrate no other acute abnormalities noted. He is appropriate for discharge home. This very likely COPD exacerbation. Plan is doxycycline, prednisone, Tessalon, albuterol inhaler. Asked in no uncertain terms to absolutely stop smoking.  I personally performed the services described in this documentation, which was scribed in my presence. The recorded information has been reviewed and is accurate.    Rolland PorterMark Rolly Magri, MD 08/27/14 1249

## 2014-08-27 NOTE — Discharge Instructions (Signed)
Chronic Obstructive Pulmonary Disease Exacerbation Chronic obstructive pulmonary disease (COPD) is a common lung condition in which airflow from the lungs is limited. COPD is a general term that can be used to describe many different lung problems that limit airflow, including chronic bronchitis and emphysema. COPD exacerbations are episodes when breathing symptoms become much worse and require extra treatment. Without treatment, COPD exacerbations can be life threatening, and frequent COPD exacerbations can cause further damage to your lungs. CAUSES   Respiratory infections.   Exposure to smoke.   Exposure to air pollution, chemical fumes, or dust. Sometimes there is no apparent cause or trigger. RISK FACTORS  Smoking cigarettes.  Older age.  Frequent prior COPD exacerbations. SIGNS AND SYMPTOMS   Increased coughing.   Increased thick spit (sputum) production.   Increased wheezing.   Increased shortness of breath.   Rapid breathing.   Chest tightness. DIAGNOSIS  Your medical history, a physical exam, and tests will help your health care provider make a diagnosis. Tests may include:  A chest X-ray.  Basic lab tests.  Sputum testing.  An arterial blood gas test. TREATMENT  Depending on the severity of your COPD exacerbation, you may need to be admitted to a hospital for treatment. Some of the treatments commonly used to treat COPD exacerbations are:   Antibiotic medicines.   Bronchodilators. These are drugs that expand the air passages. They may be given with an inhaler or nebulizer. Spacer devices may be needed to help improve drug delivery.  Corticosteroid medicines.  Supplemental oxygen therapy.  HOME CARE INSTRUCTIONS   Do not smoke. Quitting smoking is very important to prevent COPD from getting worse and exacerbations from happening as often.  Avoid exposure to all substances that irritate the airway, especially to tobacco smoke.   If you were  prescribed an antibiotic medicine, finish it all even if you start to feel better.  Take all medicines as directed by your health care provider.It is important to use correct technique with inhaled medicines.  Drink enough fluids to keep your urine clear or pale yellow (unless you have a medical condition that requires fluid restriction).  Use a cool mist vaporizer. This makes it easier to clear your chest when you cough.   If you have a home nebulizer and oxygen, continue to use them as directed.   Maintain all necessary vaccinations to prevent infections.   Exercise regularly.   Eat a healthy diet.   Keep all follow-up appointments as directed by your health care provider. SEEK IMMEDIATE MEDICAL CARE IF:  You have worsening shortness of breath.   You have trouble talking.   You have severe chest pain.  You have blood in your sputum.  You have a fever.  You have weakness, vomit repeatedly, or faint.   You feel confused.   You continue to get worse. MAKE SURE YOU:   Understand these instructions.  Will watch your condition.  Will get help right away if you are not doing well or get worse. Document Released: 01/07/2007 Document Revised: 07/27/2013 Document Reviewed: 11/14/2012 [facility] Patient Information 2015 [place]. This information is not intended to replace advice given to you by your health care provider. Make sure you discuss any questions you have with your health care provider.  Bronchospasm A bronchospasm is a spasm or tightening of the airways going into the lungs. During a bronchospasm breathing becomes more difficult because the airways get smaller. When this happens there can be coughing, a whistling sound when breathing (  wheezing), and difficulty breathing. Bronchospasm is often associated with asthma, but not all patients who experience a bronchospasm have asthma. CAUSES  A bronchospasm is caused by inflammation or irritation of the  airways. The inflammation or irritation may be triggered by:   Allergies (such as to animals, pollen, food, or mold). Allergens that cause bronchospasm may cause wheezing immediately after exposure or many hours later.   Infection. Viral infections are believed to be the most common cause of bronchospasm.   Exercise.   Irritants (such as pollution, cigarette smoke, strong odors, aerosol sprays, and paint fumes).   Weather changes. Winds increase molds and pollens in the air. Rain refreshes the air by washing irritants out. Cold air may cause inflammation.   Stress and emotional upset.  SIGNS AND SYMPTOMS   Wheezing.   Excessive nighttime coughing.   Frequent or severe coughing with a simple cold.   Chest tightness.   Shortness of breath.  DIAGNOSIS  Bronchospasm is usually diagnosed through a history and physical exam. Tests, such as chest X-rays, are sometimes done to look for other conditions. TREATMENT   Inhaled medicines can be given to open up your airways and help you breathe. The medicines can be given using either an inhaler or a nebulizer machine.  Corticosteroid medicines may be given for severe bronchospasm, usually when it is associated with asthma. HOME CARE INSTRUCTIONS   Always have a plan prepared for seeking medical care. Know when to call your health care provider and local emergency services (911 in the [place] Know where you can access local emergency care.  Only take medicines as directed by your health care provider.  If you were prescribed an inhaler or nebulizer machine, ask your health care provider to explain how to use it correctly. Always use a spacer with your inhaler if you were given one.  It is necessary to remain calm during an attack. Try to relax and breathe more slowly.  Control your home environment in the following ways:   Change your heating and air conditioning filter at least once a month.   Limit your use of  fireplaces and wood stoves.  Do not smoke and do not allow smoking in your home.   Avoid exposure to perfumes and fragrances.   Get rid of pests (such as roaches and mice) and their droppings.   Throw away plants if you see mold on them.   Keep your house clean and dust free.   Replace carpet with wood, tile, or vinyl flooring. Carpet can trap dander and dust.   Use allergy-proof pillows, mattress covers, and box spring covers.   Wash bed sheets and blankets every week in hot water and dry them in a dryer.   Use blankets that are made of polyester or cotton.   Wash hands frequently. SEEK MEDICAL CARE IF:   You have muscle aches.   You have chest pain.   The sputum changes from clear or white to yellow, green, gray, or bloody.   The sputum you cough up gets thicker.   There are problems that may be related to the medicine you are given, such as a rash, itching, swelling, or trouble breathing.  SEEK IMMEDIATE MEDICAL CARE IF:   You have worsening wheezing and coughing even after taking your prescribed medicines.   You have increased difficulty breathing.   You develop severe chest pain. MAKE SURE YOU:   Understand these instructions.  Will watch your condition.  Will get help right away  if you are not doing well or get worse. Document Released: 03/15/2003 Document Revised: 03/17/2013 Document Reviewed: 09/01/2012 [facility] Patient Information 2015 [place], [place]. This information is not intended to replace advice given to you by your health care provider. Make sure you discuss any questions you have with your health care provider.

## 2014-09-16 ENCOUNTER — Encounter (HOSPITAL_COMMUNITY): Payer: Self-pay | Admitting: Emergency Medicine

## 2014-09-16 ENCOUNTER — Emergency Department (HOSPITAL_COMMUNITY): Payer: Managed Care, Other (non HMO)

## 2014-09-16 ENCOUNTER — Emergency Department (HOSPITAL_COMMUNITY)
Admission: EM | Admit: 2014-09-16 | Discharge: 2014-09-16 | Disposition: A | Discharge status: Home / Self Care | Payer: Managed Care, Other (non HMO) | Attending: Emergency Medicine | Admitting: Emergency Medicine

## 2014-09-16 DIAGNOSIS — I10 Essential (primary) hypertension: Secondary | ICD-10-CM | POA: Diagnosis not present

## 2014-09-16 DIAGNOSIS — Y92092 Bedroom in other non-institutional residence as the place of occurrence of the external cause: Secondary | ICD-10-CM | POA: Insufficient documentation

## 2014-09-16 DIAGNOSIS — R42 Dizziness and giddiness: Secondary | ICD-10-CM | POA: Insufficient documentation

## 2014-09-16 DIAGNOSIS — Y9389 Activity, other specified: Secondary | ICD-10-CM | POA: Insufficient documentation

## 2014-09-16 DIAGNOSIS — Z8659 Personal history of other mental and behavioral disorders: Secondary | ICD-10-CM | POA: Diagnosis not present

## 2014-09-16 DIAGNOSIS — Z79899 Other long term (current) drug therapy: Secondary | ICD-10-CM | POA: Insufficient documentation

## 2014-09-16 DIAGNOSIS — Z720 Tobacco use: Secondary | ICD-10-CM | POA: Diagnosis not present

## 2014-09-16 DIAGNOSIS — Z7952 Long term (current) use of systemic steroids: Secondary | ICD-10-CM | POA: Diagnosis not present

## 2014-09-16 DIAGNOSIS — W06XXXA Fall from bed, initial encounter: Secondary | ICD-10-CM | POA: Diagnosis not present

## 2014-09-16 DIAGNOSIS — S60221A Contusion of right hand, initial encounter: Secondary | ICD-10-CM

## 2014-09-16 DIAGNOSIS — J449 Chronic obstructive pulmonary disease, unspecified: Secondary | ICD-10-CM | POA: Insufficient documentation

## 2014-09-16 DIAGNOSIS — M67431 Ganglion, right wrist: Secondary | ICD-10-CM | POA: Insufficient documentation

## 2014-09-16 DIAGNOSIS — S6991XA Unspecified injury of right wrist, hand and finger(s), initial encounter: Secondary | ICD-10-CM | POA: Diagnosis present

## 2014-09-16 DIAGNOSIS — Z8719 Personal history of other diseases of the digestive system: Secondary | ICD-10-CM | POA: Diagnosis not present

## 2014-09-16 DIAGNOSIS — Z792 Long term (current) use of antibiotics: Secondary | ICD-10-CM | POA: Diagnosis not present

## 2014-09-16 DIAGNOSIS — Y998 Other external cause status: Secondary | ICD-10-CM | POA: Diagnosis not present

## 2014-09-16 MED ORDER — MECLIZINE HCL 25 MG PO TABS: 25.0000 mg | ORAL_TABLET | Freq: Three times a day (TID) | ORAL | Status: DC | PRN

## 2014-09-16 MED ORDER — MECLIZINE HCL 12.5 MG PO TABS
25.0000 mg | ORAL_TABLET | Freq: Once | ORAL | Status: AC
  Administered 2014-09-16: 25 mg via ORAL
  Filled 2014-09-16: qty 2

## 2014-09-16 MED ORDER — IBUPROFEN 400 MG PO TABS
600.0000 mg | ORAL_TABLET | Freq: Once | ORAL | Status: AC
  Administered 2014-09-16: 600 mg via ORAL
  Filled 2014-09-16: qty 2

## 2014-09-16 MED ORDER — OXYCODONE-ACETAMINOPHEN 5-325 MG PO TABS
1.0000 | ORAL_TABLET | Freq: Once | ORAL | Status: AC
  Administered 2014-09-16: 1 via ORAL
  Filled 2014-09-16: qty 1

## 2014-09-16 NOTE — ED Provider Notes (Signed)
CSN: [id number]     Arrival date & time 09/16/14  1191 History   First MD Initiated Contact with Patient 09/16/14 [phone]     Chief Complaint  Patient presents with  . Wrist Pain     (Consider location/radiation/quality/duration/timing/severity/associated sxs/prior Treatment) HPI   39 year old male with dizziness and right wrist pain. Patient states that he was getting up out of bed this morning and reveal very off balance. He sat back down and steady himself. He got back up. The same symptoms and actually fell this time. He injured his right wrist trying to brace his fall. Denies history of pain anywhere else. No headaches, neck or back pain. Does endorse some tinnitus. No acute visual complaints. No acute numbness, tingling or focal loss of strength. Since his fall this morning is noticed a small mass over the dorsal aspect of his proximal hand/distal wrist.  Past Medical History  Diagnosis Date  . Hypertension   . GERD (gastroesophageal reflux disease)   . COPD (chronic obstructive pulmonary disease)   . Depression   . High cholesterol   . Anxiety   . Suicidal ideation   . diet controlled diabetes     former   Past Surgical History  Procedure Laterality Date  . Appendectomy    . Abdominal surgery    . Meniscus repair    . Vascular surgery    . Leg surgery      when 12. Patient reports he was in tractor accident and leg had to be repaired   No family history on file. History  Substance Use Topics  . Smoking status: Current Every Day Smoker -- 1.50 packs/day    Types: Cigarettes  . Smokeless tobacco: Not on file  . Alcohol Use: Yes     Comment: occ    Review of Systems  All systems reviewed and negative, other than as noted in HPI.   Allergies  Pineapple  Home Medications   Prior to Admission medications   Medication Sig Start Date End Date Taking? Authorizing Provider  albuterol (PROVENTIL HFA;VENTOLIN HFA) 108 (90 BASE) MCG/ACT inhaler Inhale 1-2 puffs into  the lungs every 6 (six) hours as needed for wheezing. 08/27/14   Rolland Porter, MD  albuterol (PROVENTIL) (5 MG/ML) 0.5% nebulizer solution Take 0.5 mLs (2.5 mg total) by nebulization every 6 (six) hours as needed for wheezing or shortness of breath. 08/27/14   Rolland Porter, MD  benzonatate (TESSALON) 100 MG capsule Take 1 capsule (100 mg total) by mouth every 8 (eight) hours. 08/27/14   Rolland Porter, MD  doxycycline (VIBRAMYCIN) 100 MG capsule Take 1 capsule (100 mg total) by mouth 2 (two) times daily. 08/27/14   Rolland Porter, MD  HYDROcodone-acetaminophen (NORCO/VICODIN) 5-325 MG per tablet Take 1 tablet by mouth every 4 (four) hours as needed. Patient not taking: Reported on 08/27/2014 02/27/14   Ivery Quale, PA-C  ibuprofen (ADVIL,MOTRIN) 800 MG tablet Take 1 tablet (800 mg total) by mouth 3 (three) times daily. Patient not taking: Reported on 08/27/2014 02/27/14   Ivery Quale, PA-C  meclizine (ANTIVERT) 25 MG tablet Take 1 tablet (25 mg total) by mouth 3 (three) times daily as needed for dizziness. 09/16/14   Raeford Razor, MD  methocarbamol (ROBAXIN) 500 MG tablet Take 1 tablet (500 mg total) by mouth 3 (three) times daily. Patient not taking: Reported on 08/27/2014 02/27/14   Ivery Quale, PA-C  predniSONE (DELTASONE) 20 MG tablet Take 1 tablet (20 mg total) by mouth 2 (two) times daily with  a meal. 08/27/14   Rolland Porter, MD  Pseudoeph-Doxylamine-DM-APAP (NYQUIL MULTI-SYMPTOM PO) Take 2 capsules by mouth at bedtime as needed (cold/cough symptoms).    Historical Provider, MD  Pseudoephedrine-APAP-DM (DAYQUIL MULTI-SYMPTOM COLD/FLU PO) Take 2 capsules by mouth daily as needed (cold/cough symptoms).    Historical Provider, MD   BP 134/97 mmHg  Pulse 70  Temp(Src) 98 F (36.7 C) (Oral)  Resp 14  Ht 5\' 11"  (1.803 m)  Wt 215 lb (97.523 kg)  BMI 30.00 kg/m2  SpO2 100% Physical Exam  Constitutional: He appears well-developed and well-nourished. No distress.  HENT:  Head: Normocephalic and atraumatic.  Eyes:  Conjunctivae are normal. Right eye exhibits no discharge. Left eye exhibits no discharge.  Neck: Neck supple.  Cardiovascular: Normal rate, regular rhythm and normal heart sounds.  Exam reveals no gallop and no friction rub.   No murmur heard. Pulmonary/Chest: Effort normal and breath sounds normal. No respiratory distress.  Abdominal: Soft. He exhibits no distension. There is no tenderness.  Musculoskeletal: He exhibits no edema or tenderness.       Hands: Dime sized round/ovoid swelling in pictured area. Nontender. Appearance, location and consistency consistent with ganglion ciyst.   Neurological: He is alert.  Skin: Skin is warm and dry.  Psychiatric: He has a normal mood and affect. His behavior is normal. Thought content normal.  Nursing note and vitals reviewed.   ED Course  Procedures (including critical care time) Labs Review Labs Reviewed - No data to display  Imaging Review Dg Hand Complete Right  09/16/2014   CLINICAL DATA:  No acute osseous abnormalities.  EXAM: RIGHT HAND - COMPLETE 3+ VIEW  COMPARISON:  None  FINDINGS: Corticated old appearing nonunited ossicle at tip of ulnar styloid.  Osseous mineralization normal.  Joint spaces preserved.  No acute fracture, dislocation or bone destruction.  Dorsal soft tissue swelling at carpus on lateral view.  IMPRESSION: No acute osseous abnormalities.   Electronically Signed   By: Ulyses Southward M.D.   On: 09/16/2014 08:21     EKG Interpretation None      MDM   Final diagnoses:  Vertigo  Ganglion cyst of wrist, right  Hand contusion, right, initial encounter    39 year old male with right hand pain after fall this morning. Patient describes vertiginous symptoms as the cause of his fall. Nonfocal neuro exam. Closed injury. Neurovascularly intact. Negative imaging.     Raeford Razor, MD 09/22/14 [phone]

## 2014-09-16 NOTE — ED Notes (Signed)
Pt made aware to return if symptoms worsen or if any life threatening symptoms occur.   

## 2014-09-16 NOTE — ED Notes (Signed)
Pt c/o right wrist pain after falling onto it this am.

## 2014-09-16 NOTE — Discharge Instructions (Signed)
Benign Positional Vertigo Vertigo means you feel like you or your surroundings are moving when they are not. Benign positional vertigo is the most common form of vertigo. Benign means that the cause of your condition is not serious. Benign positional vertigo is more common in older adults. CAUSES  Benign positional vertigo is the result of an upset in the labyrinth system. This is an area in the middle ear that helps control your balance. This may be caused by a viral infection, head injury, or repetitive motion. However, often no specific cause is found. SYMPTOMS  Symptoms of benign positional vertigo occur when you move your head or eyes in different directions. Some of the symptoms may include:  Loss of balance and falls.  Vomiting.  Blurred vision.  Dizziness.  Nausea.  Involuntary eye movements (nystagmus). DIAGNOSIS  Benign positional vertigo is usually diagnosed by physical exam. If the specific cause of your benign positional vertigo is unknown, your caregiver may perform imaging tests, such as magnetic resonance imaging (MRI) or computed tomography (CT). TREATMENT  Your caregiver may recommend movements or procedures to correct the benign positional vertigo. Medicines such as meclizine, benzodiazepines, and medicines for nausea may be used to treat your symptoms. In rare cases, if your symptoms are caused by certain conditions that affect the inner ear, you may need surgery. HOME CARE INSTRUCTIONS   Follow your caregiver's instructions.  Move slowly. Do not make sudden body or head movements.  Avoid driving.  Avoid operating heavy machinery.  Avoid performing any tasks that would be dangerous to you or others during a vertigo episode.  Drink enough fluids to keep your urine clear or pale yellow. SEEK IMMEDIATE MEDICAL CARE IF:   You develop problems with walking, weakness, numbness, or using your arms, hands, or legs.  You have difficulty speaking.  You develop  severe headaches.  Your nausea or vomiting continues or gets worse.  You develop visual changes.  Your family or friends notice any behavioral changes.  Your condition gets worse.  You have a fever.  You develop a stiff neck or sensitivity to light. MAKE SURE YOU:   Understand these instructions.  Will watch your condition.  Will get help right away if you are not doing well or get worse. Document Released: 12/18/2005 Document Revised: 06/04/2011 Document Reviewed: 11/30/2010 [facility] Patient Information 2015 [place], [place]. This information is not intended to replace advice given to you by your health care provider. Make sure you discuss any questions you have with your health care provider.  Contusion A contusion is a deep bruise. Contusions happen when an injury causes bleeding under the skin. Signs of bruising include pain, puffiness (swelling), and discolored skin. The contusion may turn blue, purple, or yellow. HOME CARE   Put ice on the injured area.  Put ice in a plastic bag.  Place a towel between your skin and the bag.  Leave the ice on for 15-20 minutes, 03-04 times a day.  Only take medicine as told by your doctor.  Rest the injured area.  If possible, raise (elevate) the injured area to lessen puffiness. GET HELP RIGHT AWAY IF:   You have more bruising or puffiness.  You have pain that is getting worse.  Your puffiness or pain is not helped by medicine. MAKE SURE YOU:   Understand these instructions.  Will watch your condition.  Will get help right away if you are not doing well or get worse. Document Released: 08/29/2007 Document Revised: 06/04/2011 Document Reviewed:  01/15/2011 [employer] Patient Information 2015 [place], [place]. This information is not intended to replace advice given to you by your health care provider. Make sure you discuss any questions you have with your health care provider.  Ganglion Cyst A ganglion cyst is a  noncancerous, fluid-filled lump that occurs near joints or tendons. The ganglion cyst grows out of a joint or the lining of a tendon. It most often develops in the hand or wrist but can also develop in the shoulder, elbow, hip, knee, ankle, or foot. The round or oval ganglion can be pea sized or larger than a grape. Increased activity may enlarge the size of the cyst because more fluid starts to build up.  CAUSES  It is not completely known what causes a ganglion cyst to grow. However, it may be related to:  Inflammation or irritation around the joint.  An injury.  Repetitive movements or overuse.  Arthritis. SYMPTOMS  A lump most often appears in the hand or wrist, but can occur in other areas of the body. Generally, the lump is painless without other symptoms. However, sometimes pain can be felt during activity or when pressure is applied to the lump. The lump may even be tender to the touch. Tingling, pain, numbness, or muscle weakness can occur if the ganglion cyst presses on a nerve. Your grip may be weak and you may have less movement in your joints.  DIAGNOSIS  Ganglion cysts are most often diagnosed based on a physical exam, noting where the cyst is and how it looks. Your caregiver will feel the lump and may shine a light alongside it. If it is a ganglion, a light often shines through it. Your caregiver may order an X-ray, ultrasound, or MRI to rule out other conditions. TREATMENT  Ganglions usually go away on their own without treatment. If pain or other symptoms are involved, treatment may be needed. Treatment is also needed if the ganglion limits your movement or if it gets infected. Treatment options include:  Wearing a wrist or finger brace or splint.  Taking anti-inflammatory medicine.  Draining fluid from the lump with a needle (aspiration).  Injecting a steroid into the joint.  Surgery to remove the ganglion cyst and its stalk that is attached to the joint or tendon.  However, ganglion cysts can grow back. HOME CARE INSTRUCTIONS   Do not press on the ganglion, poke it with a needle, or hit it with a heavy object. You may rub the lump gently and often. Sometimes fluid moves out of the cyst.  Only take medicines as directed by your caregiver.  Wear your brace or splint as directed by your caregiver. SEEK MEDICAL CARE IF:   Your ganglion becomes larger or more painful.  You have increased redness, red streaks, or swelling.  You have pus coming from the lump.  You have weakness or numbness in the affected area. MAKE SURE YOU:   Understand these instructions.  Will watch your condition.  Will get help right away if you are not doing well or get worse. Document Released: 03/09/2000 Document Revised: 12/05/2011 Document Reviewed: 05/06/2007 [facility] Patient Information 2015 [place], [place]. This information is not intended to replace advice given to you by your health care provider. Make sure you discuss any questions you have with your health care provider.

## 2015-05-02 ENCOUNTER — Encounter (HOSPITAL_COMMUNITY): Payer: Self-pay | Admitting: *Deleted

## 2015-05-02 ENCOUNTER — Emergency Department (HOSPITAL_COMMUNITY)
Admission: EM | Admit: 2015-05-02 | Discharge: 2015-05-02 | Disposition: A | Discharge status: Home / Self Care | Payer: Managed Care, Other (non HMO) | Attending: Emergency Medicine | Admitting: Emergency Medicine

## 2015-05-02 DIAGNOSIS — J449 Chronic obstructive pulmonary disease, unspecified: Secondary | ICD-10-CM | POA: Insufficient documentation

## 2015-05-02 DIAGNOSIS — F17210 Nicotine dependence, cigarettes, uncomplicated: Secondary | ICD-10-CM | POA: Diagnosis not present

## 2015-05-02 DIAGNOSIS — I10 Essential (primary) hypertension: Secondary | ICD-10-CM | POA: Insufficient documentation

## 2015-05-02 DIAGNOSIS — Z8719 Personal history of other diseases of the digestive system: Secondary | ICD-10-CM | POA: Diagnosis not present

## 2015-05-02 DIAGNOSIS — F419 Anxiety disorder, unspecified: Secondary | ICD-10-CM | POA: Insufficient documentation

## 2015-05-02 DIAGNOSIS — E119 Type 2 diabetes mellitus without complications: Secondary | ICD-10-CM | POA: Insufficient documentation

## 2015-05-02 DIAGNOSIS — Z79899 Other long term (current) drug therapy: Secondary | ICD-10-CM | POA: Diagnosis not present

## 2015-05-02 DIAGNOSIS — J0100 Acute maxillary sinusitis, unspecified: Secondary | ICD-10-CM

## 2015-05-02 DIAGNOSIS — R51 Headache: Secondary | ICD-10-CM | POA: Diagnosis present

## 2015-05-02 MED ORDER — DIPHENHYDRAMINE HCL 50 MG/ML IJ SOLN
25.0000 mg | Freq: Once | INTRAMUSCULAR | Status: AC
Start: 2015-05-02 — End: 2015-05-02
  Administered 2015-05-02: 25 mg via INTRAVENOUS
  Filled 2015-05-02: qty 1

## 2015-05-02 MED ORDER — AMOXICILLIN 500 MG PO CAPS: 500.0000 mg | ORAL_CAPSULE | Freq: Three times a day (TID) | ORAL | Status: DC

## 2015-05-02 MED ORDER — METOCLOPRAMIDE HCL 5 MG/ML IJ SOLN
10.0000 mg | Freq: Once | INTRAMUSCULAR | Status: AC
  Administered 2015-05-02: 10 mg via INTRAVENOUS
  Filled 2015-05-02: qty 2

## 2015-05-02 MED ORDER — KETOROLAC TROMETHAMINE 30 MG/ML IJ SOLN
30.0000 mg | Freq: Once | INTRAMUSCULAR | Status: AC
  Administered 2015-05-02: 30 mg via INTRAVENOUS
  Filled 2015-05-02: qty 1

## 2015-05-02 MED ORDER — TRAMADOL HCL 50 MG PO TABS: 50.0000 mg | ORAL_TABLET | Freq: Two times a day (BID) | ORAL | Status: DC | PRN

## 2015-05-02 NOTE — ED Notes (Signed)
Pt states understanding of care given and follow up instructions.  Ambulated from ED  

## 2015-05-02 NOTE — ED Notes (Addendum)
Pt c/o headache, congestion and dizziness. Reports having vertigo before and this feels similar.

## 2015-05-02 NOTE — ED Provider Notes (Signed)
CSN: [id number]     Arrival date & time 05/02/15  1353 History   First MD Initiated Contact with Patient 05/02/15 1752     Chief Complaint  Patient presents with  . Headache     (Consider location/radiation/quality/duration/timing/severity/associated sxs/prior Treatment) Patient is a 40 y.o. male presenting with headaches. The history is provided by the patient (The patient complains of sinus congestion and headache for a number days).  Headache Pain location:  Frontal Quality:  Dull Radiates to:  Does not radiate Severity currently:  6/10 Severity at highest:  8/10 Onset quality:  Gradual Timing:  Constant Progression:  Worsening Chronicity:  New Context: not activity   Associated symptoms: congestion   Associated symptoms: no abdominal pain, no back pain, no cough, no diarrhea, no fatigue, no seizures and no sinus pressure     Past Medical History  Diagnosis Date  . Hypertension   . GERD (gastroesophageal reflux disease)   . COPD (chronic obstructive pulmonary disease) (HCC)   . Depression   . High cholesterol   . Anxiety   . Suicidal ideation   . diet controlled diabetes     former   Past Surgical History  Procedure Laterality Date  . Appendectomy    . Abdominal surgery    . Meniscus repair    . Vascular surgery    . Leg surgery      when 12. Patient reports he was in tractor accident and leg had to be repaired   History reviewed. No pertinent family history. Social History  Substance Use Topics  . Smoking status: Current Every Day Smoker -- 1.50 packs/day    Types: Cigarettes  . Smokeless tobacco: None  . Alcohol Use: Yes     Comment: occ    Review of Systems  Constitutional: Negative for appetite change and fatigue.  HENT: Positive for congestion. Negative for ear discharge and sinus pressure.   Eyes: Negative for discharge.  Respiratory: Negative for cough.   Cardiovascular: Negative for chest pain.  Gastrointestinal: Negative for abdominal pain and  diarrhea.  Genitourinary: Negative for frequency and hematuria.  Musculoskeletal: Negative for back pain.  Skin: Negative for rash.  Neurological: Positive for headaches. Negative for seizures.  Psychiatric/Behavioral: Negative for hallucinations.      Allergies  Pineapple  Home Medications   Prior to Admission medications   Medication Sig Start Date End Date Taking? Authorizing Provider  albuterol (PROVENTIL HFA;VENTOLIN HFA) 108 (90 BASE) MCG/ACT inhaler Inhale 1-2 puffs into the lungs every 6 (six) hours as needed for wheezing. 08/27/14  Yes Rolland Porter, MD  albuterol (PROVENTIL) (5 MG/ML) 0.5% nebulizer solution Take 0.5 mLs (2.5 mg total) by nebulization every 6 (six) hours as needed for wheezing or shortness of breath. 08/27/14  Yes Rolland Porter, MD  ibuprofen (ADVIL,MOTRIN) 200 MG tablet Take 800 mg by mouth every 6 (six) hours as needed for headache or moderate pain.   Yes Historical Provider, MD  amoxicillin (AMOXIL) 500 MG capsule Take 1 capsule (500 mg total) by mouth 3 (three) times daily. 05/02/15   Bethann Berkshire, MD  traMADol (ULTRAM) 50 MG tablet Take 1 tablet (50 mg total) by mouth every 12 (twelve) hours as needed for severe pain. 05/02/15   Bethann Berkshire, MD   BP 136/86 mmHg  Pulse 75  Temp(Src) 99.3 F (37.4 C) (Oral)  Resp 18  Ht 6' (1.829 m)  Wt 215 lb (97.523 kg)  BMI 29.15 kg/m2  SpO2 98% Physical Exam  Constitutional: He is oriented  to person, place, and time. He appears well-developed.  HENT:  Head: Normocephalic.  Tender maxillary and frontal sinus  Eyes: Conjunctivae and EOM are normal. No scleral icterus.  Neck: Neck supple. No thyromegaly present.  Cardiovascular: Normal rate and regular rhythm.  Exam reveals no gallop and no friction rub.   No murmur heard. Pulmonary/Chest: No stridor. He has no wheezes. He has no rales. He exhibits no tenderness.  Abdominal: He exhibits no distension. There is no tenderness. There is no rebound.  Musculoskeletal: Normal  range of motion. He exhibits no edema.  Lymphadenopathy:    He has no cervical adenopathy.  Neurological: He is oriented to person, place, and time. He exhibits normal muscle tone. Coordination normal.  Skin: No rash noted. No erythema.  Psychiatric: He has a normal mood and affect. His behavior is normal.    ED Course  Procedures (including critical care time) Labs Review Labs Reviewed - No data to display  Imaging Review No results found. I have personally reviewed and evaluated these images and lab results as part of my medical decision-making.   EKG Interpretation None      MDM   Final diagnoses:  Acute maxillary sinusitis, recurrence not specified    Sinusitis and headache patient will be treated with amoxicillin and given some Ultram he will follow-up with a primary care doctor    Bethann Berkshire, MD 05/02/15 1920

## 2015-05-02 NOTE — ED Notes (Signed)
Headache, facial pain, teeth pain, cough- green sputum nausea, vomiting, dizziness

## 2015-05-02 NOTE — Discharge Instructions (Signed)
Follow up with Dr. Lodema Hong if needed or [facility]

## 2016-02-21 IMAGING — DX DG HAND COMPLETE 3+V*R*
3 series · 3 of 3 positions shown · non-contrast
Comparison: None

CLINICAL DATA: No acute osseous abnormalities.

EXAM:
RIGHT HAND - COMPLETE 3+ VIEW

[hand pa]
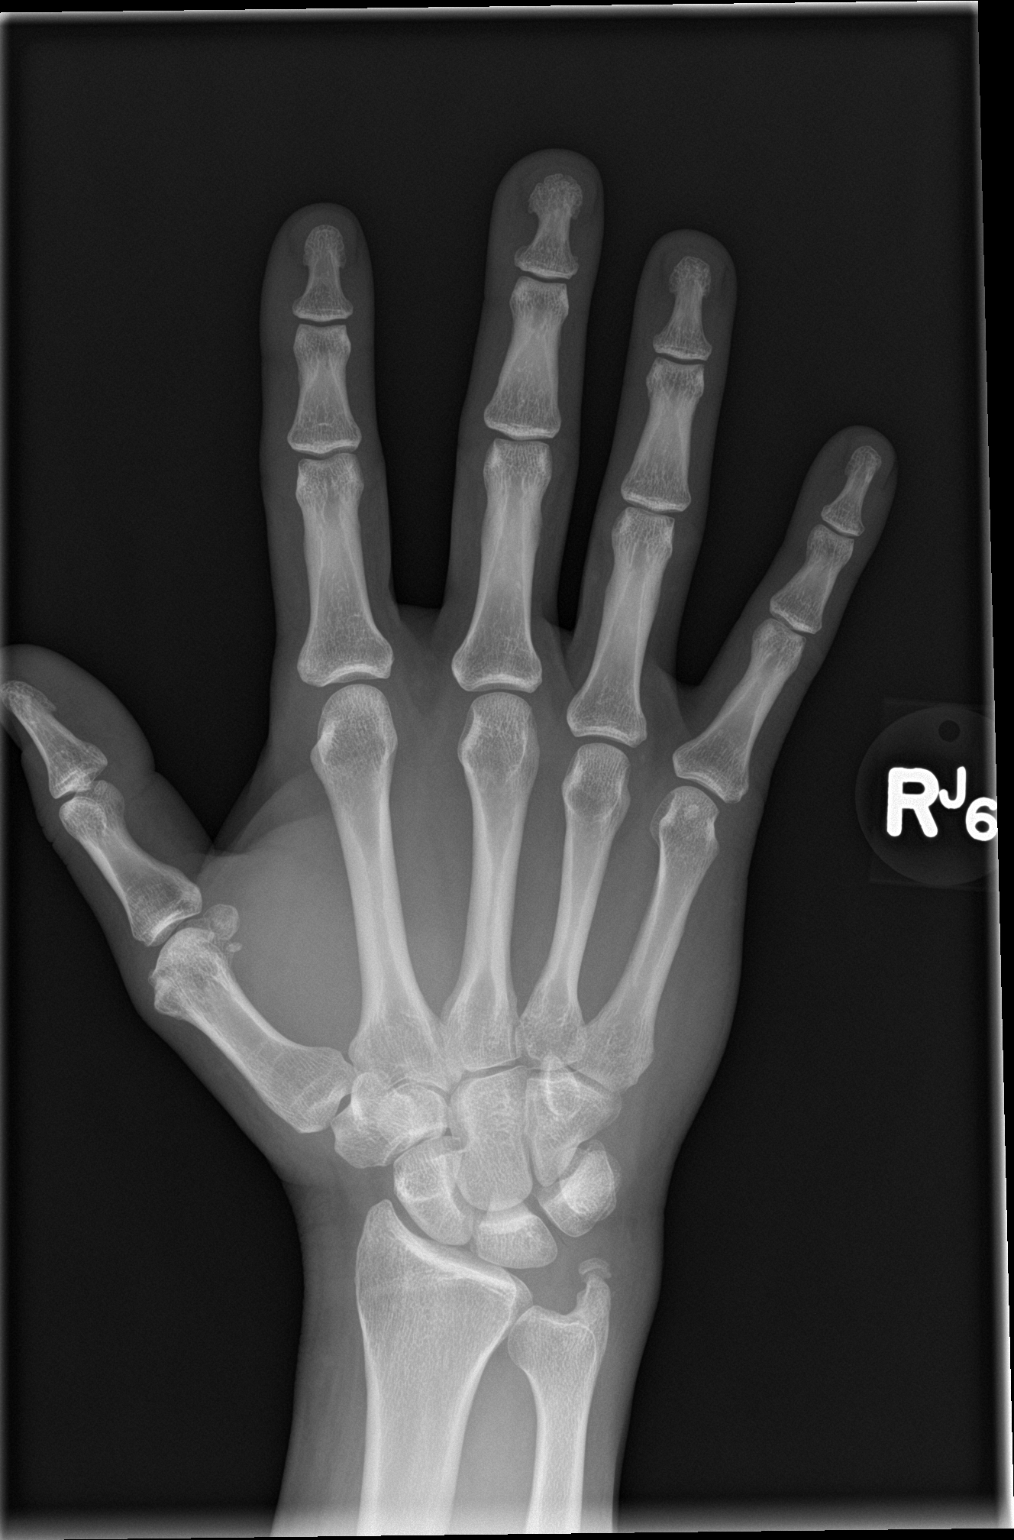

[hand obl]
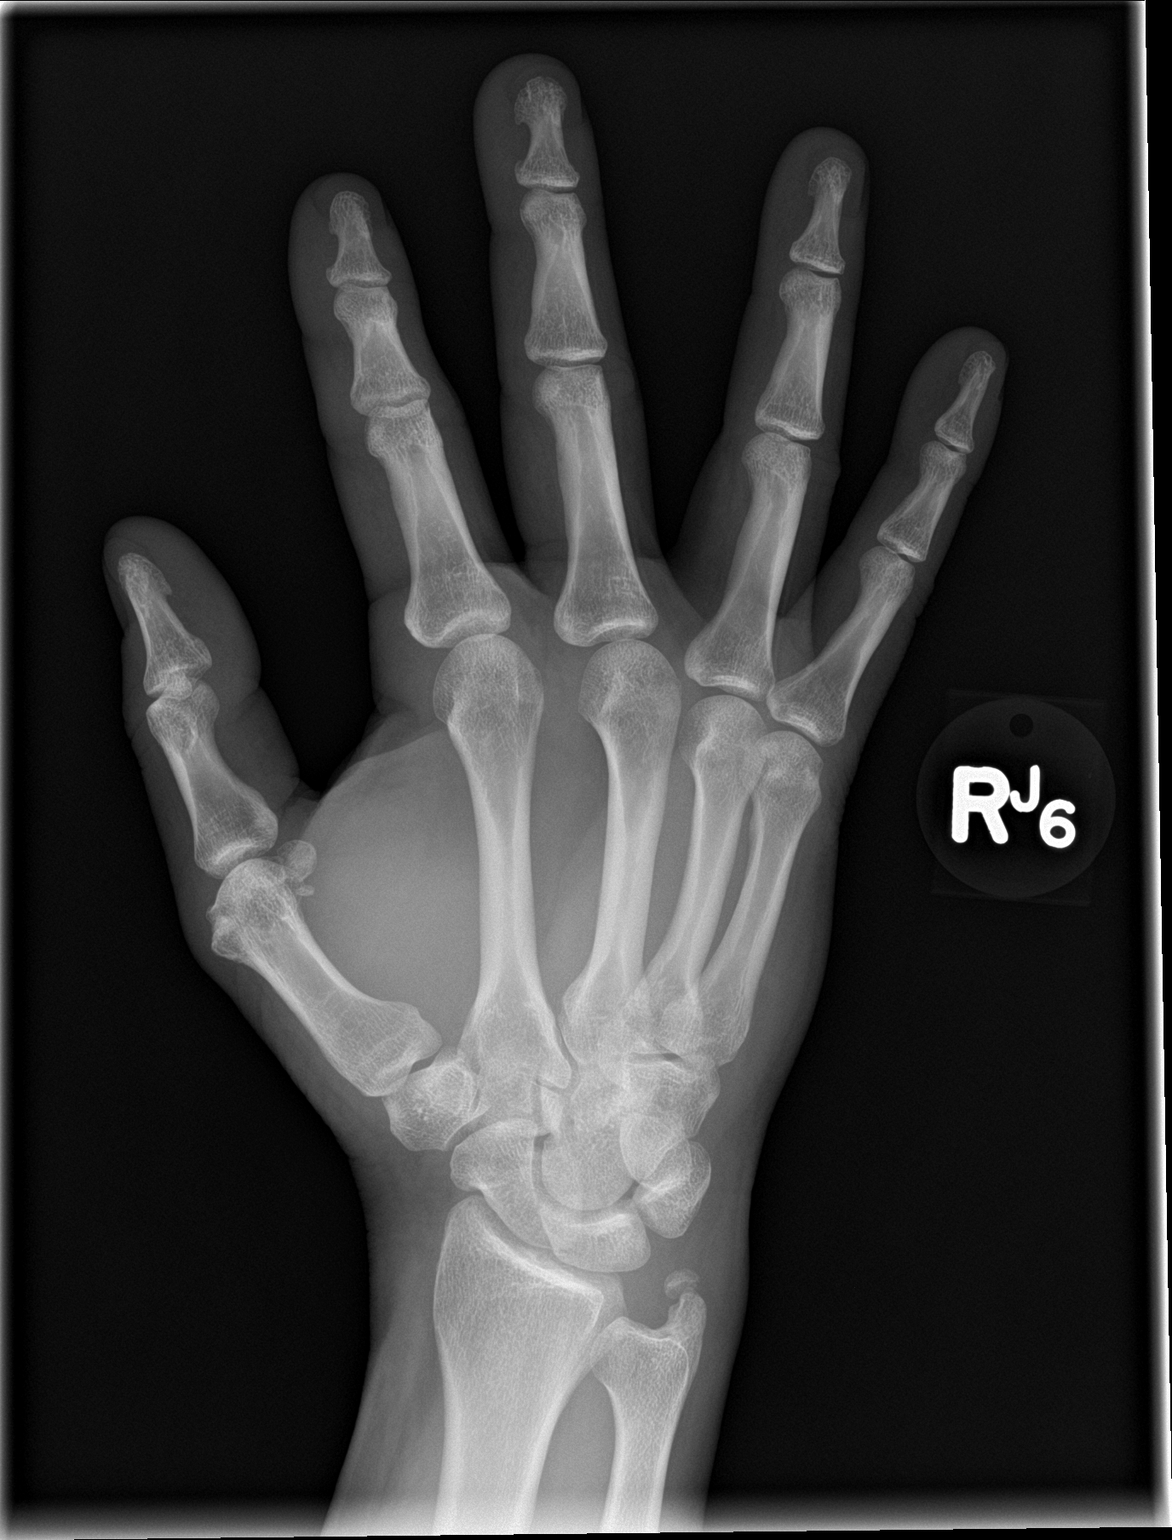

[hand lat]
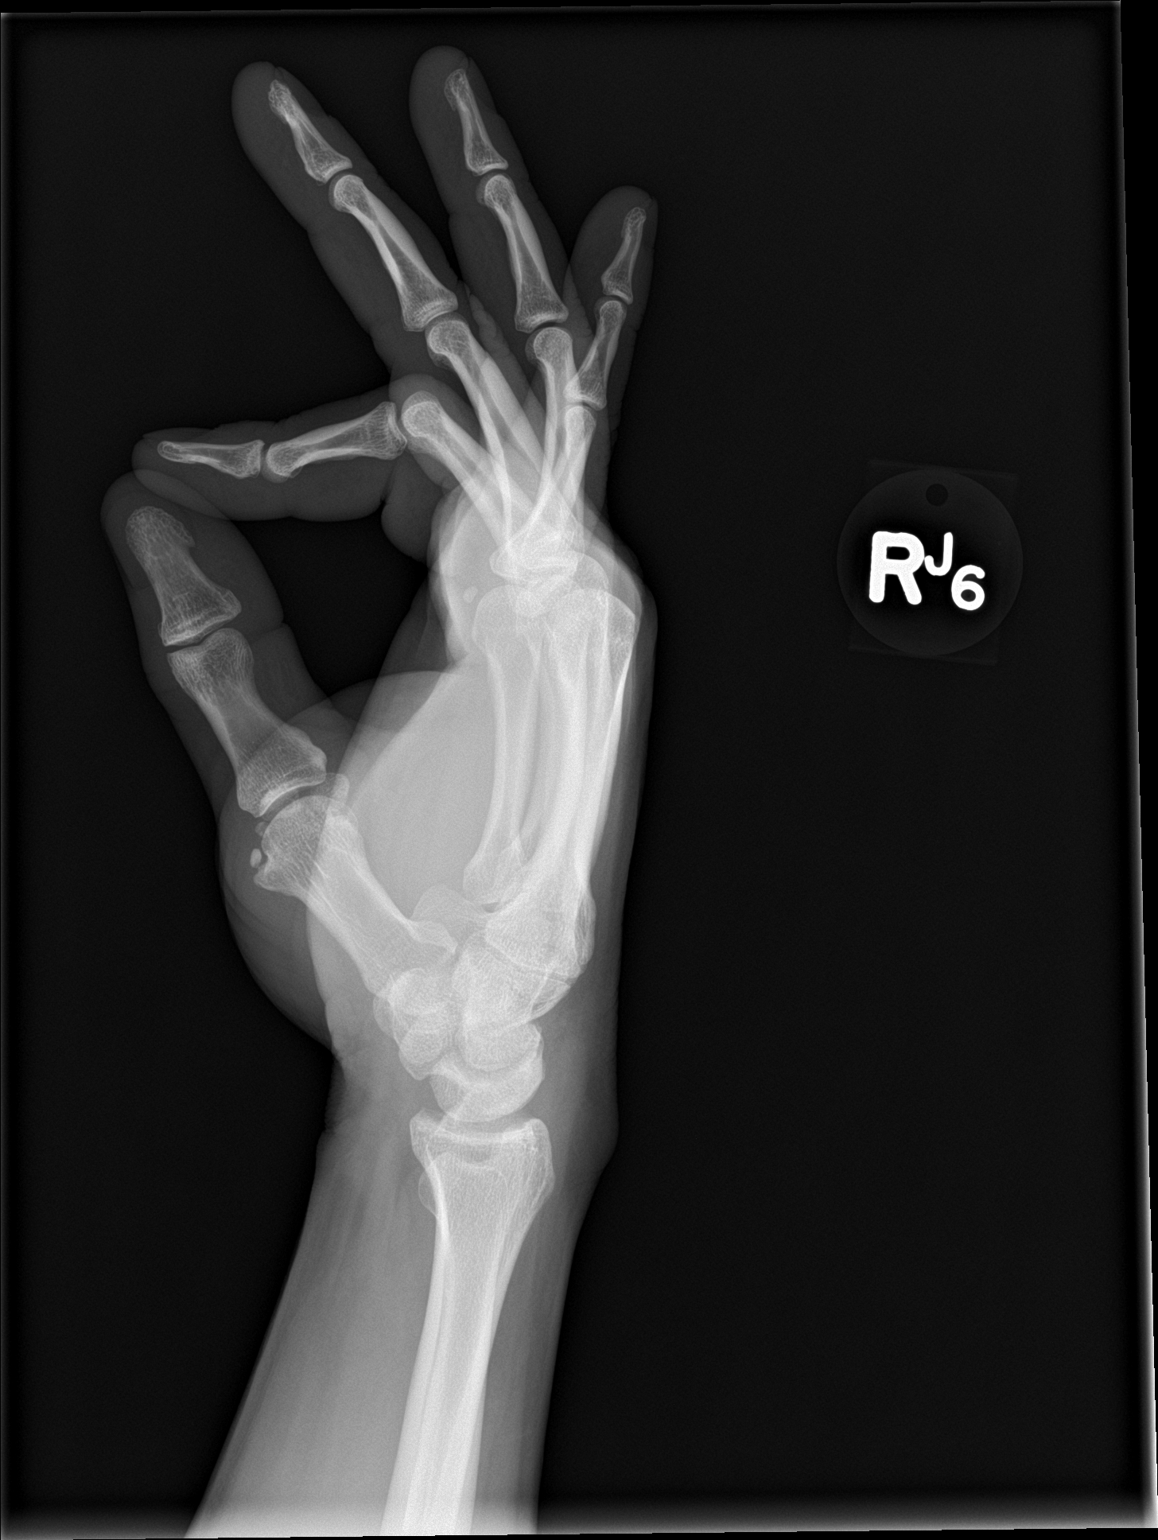

[3 of 3 positions shown; findings below may reference images not displayed]

FINDINGS: Corticated old appearing nonunited ossicle at tip of ulnar styloid.

Osseous mineralization normal.

Joint spaces preserved.

No acute fracture, dislocation or bone destruction.

Dorsal soft tissue swelling at carpus on lateral view.
IMPRESSION: No acute osseous abnormalities.

## 2016-03-27 ENCOUNTER — Encounter (HOSPITAL_COMMUNITY): Payer: Self-pay | Admitting: Emergency Medicine

## 2016-03-27 ENCOUNTER — Emergency Department (HOSPITAL_COMMUNITY)
Admission: EM | Admit: 2016-03-27 | Discharge: 2016-03-27 | Disposition: A | Discharge status: Home / Self Care | Payer: Self-pay | Attending: Emergency Medicine | Admitting: Emergency Medicine

## 2016-03-27 ENCOUNTER — Emergency Department (HOSPITAL_COMMUNITY): Payer: Self-pay

## 2016-03-27 DIAGNOSIS — Z7982 Long term (current) use of aspirin: Secondary | ICD-10-CM | POA: Insufficient documentation

## 2016-03-27 DIAGNOSIS — R079 Chest pain, unspecified: Secondary | ICD-10-CM | POA: Insufficient documentation

## 2016-03-27 DIAGNOSIS — R42 Dizziness and giddiness: Secondary | ICD-10-CM | POA: Insufficient documentation

## 2016-03-27 DIAGNOSIS — I10 Essential (primary) hypertension: Secondary | ICD-10-CM | POA: Insufficient documentation

## 2016-03-27 DIAGNOSIS — F17210 Nicotine dependence, cigarettes, uncomplicated: Secondary | ICD-10-CM | POA: Insufficient documentation

## 2016-03-27 DIAGNOSIS — J449 Chronic obstructive pulmonary disease, unspecified: Secondary | ICD-10-CM | POA: Insufficient documentation

## 2016-03-27 DIAGNOSIS — Z791 Long term (current) use of non-steroidal anti-inflammatories (NSAID): Secondary | ICD-10-CM | POA: Insufficient documentation

## 2016-03-27 LAB — BASIC METABOLIC PANEL
Anion gap: 5 (ref 5–15)
BUN: 8 mg/dL (ref 6–20)
CHLORIDE: 103 mmol/L (ref 101–111)
CO2: 29 mmol/L (ref 22–32)
Calcium: 9.4 mg/dL (ref 8.9–10.3)
Creatinine, Ser: 0.79 mg/dL (ref 0.61–1.24)
GFR calc Af Amer: >60 mL/min (ref >60)
GFR calc non Af Amer: >60 mL/min (ref >60)
Glucose, Bld: 132 mg/dL — ABNORMAL HIGH (ref 65–99)
POTASSIUM: 4.1 mmol/L (ref 3.5–5.1)
SODIUM: 137 mmol/L (ref 135–145)

## 2016-03-27 LAB — CBC WITH DIFFERENTIAL/PLATELET
Basophils Absolute: 0 10*3/uL (ref 0.0–0.1)
Basophils Relative: 0 %
EOS ABS: 0.3 10*3/uL (ref 0.0–0.7)
Eosinophils Relative: 4 %
HEMATOCRIT: 44.2 % (ref 39.0–52.0)
HEMOGLOBIN: 15.6 g/dL (ref 13.0–17.0)
LYMPHS ABS: 1.3 10*3/uL (ref 0.7–4.0)
LYMPHS PCT: 17 %
MCH: 30.8 pg (ref 26.0–34.0)
MCHC: 35.3 g/dL (ref 30.0–36.0)
MCV: 87.4 fL (ref 78.0–100.0)
MONOS PCT: 6 %
Monocytes Absolute: 0.5 10*3/uL (ref 0.1–1.0)
NEUTROS ABS: 5.5 10*3/uL (ref 1.7–7.7)
NEUTROS PCT: 73 %
Platelets: 211 10*3/uL (ref 150–400)
RBC: 5.06 MIL/uL (ref 4.22–5.81)
RDW: 12.2 % (ref 11.5–15.5)
WBC: 7.6 10*3/uL (ref 4.0–10.5)

## 2016-03-27 LAB — TROPONIN I

## 2016-03-27 MED ORDER — MECLIZINE HCL 25 MG PO TABS
25.0000 mg | ORAL_TABLET | Freq: Three times a day (TID) | ORAL | 0 refills | Status: DC | PRN
Start: 2016-03-27 — End: 2019-10-24

## 2016-03-27 MED ORDER — ACETAMINOPHEN 500 MG PO TABS
1000.0000 mg | ORAL_TABLET | Freq: Once | ORAL | Status: AC
  Administered 2016-03-27: 1000 mg via ORAL
  Filled 2016-03-27: qty 2

## 2016-03-27 MED ORDER — IPRATROPIUM-ALBUTEROL 0.5-2.5 (3) MG/3ML IN SOLN
3.0000 mL | Freq: Once | RESPIRATORY_TRACT | Status: AC
  Administered 2016-03-27: 3 mL via RESPIRATORY_TRACT
  Filled 2016-03-27: qty 3

## 2016-03-27 MED ORDER — ALBUTEROL SULFATE (2.5 MG/3ML) 0.083% IN NEBU
2.5000 mg | INHALATION_SOLUTION | Freq: Once | RESPIRATORY_TRACT | Status: AC
  Administered 2016-03-27: 2.5 mg via RESPIRATORY_TRACT
  Filled 2016-03-27: qty 3

## 2016-03-27 NOTE — ED Notes (Signed)
Rt called fro tx

## 2016-03-27 NOTE — Discharge Instructions (Signed)
Tests were good. Medication for dizziness or vertigo. Return if worse

## 2016-03-27 NOTE — ED Triage Notes (Signed)
Sudden onset of chest pain to L side that is worse with moevment, with dizziness per ems. Pt a/o. Nad. No obvious ss of pain noted at this time. Pt c/o dizziness at present. cbg 165 en route. C/o cough x 4 days, nonprod

## 2016-03-27 NOTE — ED Notes (Signed)
Taken to xray.

## 2016-03-27 NOTE — ED Provider Notes (Signed)
[facility] Provider Note   CSN: [id number] Arrival date & time: 03/27/16  [id number]  By signing my name below, I, Modena JanskyAlbert Thayil, attest that this documentation has been prepared under the direction and in the presence of Donnetta HutchingBrian Chela Sutphen, MD . Electronically Signed: Modena JanskyAlbert Thayil, Scribe. 03/27/2016. 9:22 AM.  History   Chief Complaint Chief Complaint  Patient presents with  . Chest Pain   The history is provided by the patient. No language interpreter was used.   HPI Comments: Willie Herman is a 41 y.o. male with a PMHx of COPD and HTN who presents to the Emergency Department complaining of intermittent moderate dizziness that started just PTA. 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 He reports associated SOB (~15 minutes) and fleeting chest pain (suspected from recent sickness). He has been sick the past with cough/congestion for the past 3-4 days. He admits to a hx of smoking. He denies any decreased fluid intake, diaphoresis, vomiting, or other complaints.   Past Medical History:  Diagnosis Date  . Anxiety   . COPD (chronic obstructive pulmonary disease) (HCC)   . Depression   . diet controlled diabetes    former  . GERD (gastroesophageal reflux disease)   . High cholesterol   . Hypertension   . Suicidal ideation     There are no active problems to display for this patient.   Past Surgical History:  Procedure Laterality Date  . ABDOMINAL SURGERY    . APPENDECTOMY    . LEG SURGERY     when 12. Patient reports he was in tractor accident and leg had to be repaired  . MENISCUS REPAIR    . VASCULAR SURGERY         Home Medications    Prior to Admission medications   Medication Sig Start Date End Date Taking? Authorizing Provider  Aspirin-Salicylamide-Caffeine (BC HEADACHE POWDER PO) Take 1 packet by mouth every 4 (four) hours.   Yes Historical Provider, MD  ibuprofen (ADVIL,MOTRIN) 200 MG tablet Take 800 mg by  mouth every 6 (six) hours as needed for headache or moderate pain.   Yes Historical Provider, MD  meclizine (ANTIVERT) 25 MG tablet Take 1 tablet (25 mg total) by mouth 3 (three) times daily as needed for dizziness. 03/27/16   Donnetta HutchingBrian Chakita Mcgraw, MD    Family History History reviewed. No pertinent family history.  Social History Social History  Substance Use Topics  . Smoking status: Current Every Day Smoker    Packs/day: 1.50    Types: Cigarettes  . Smokeless tobacco: Never Used  . Alcohol use Yes     Comment: occ     Allergies   Pineapple   Review of Systems Review of Systems A complete 10 system review of systems was obtained and all systems are negative except as noted in the HPI and PMH.   Physical Exam Updated Vital Signs BP 165/88   Pulse 73   Resp 15   Ht 5\' 11"  (1.803 m)   Wt 210 lb (95.3 kg)   SpO2 95%   BMI 29.29 kg/m   Physical Exam  Constitutional: He is oriented to person, place, and time. He appears well-developed and well-nourished.  HENT:  Head: Normocephalic and atraumatic.  Eyes: Conjunctivae are normal.  Neck: Neck supple.  Cardiovascular: Normal rate and regular rhythm.   Pulmonary/Chest: Effort normal. He has wheezes.  Minimal, right greater than left, expiratory wheeze.  Abdominal: Soft. Bowel  sounds are normal.  Musculoskeletal: Normal range of motion.  Neurological: He is alert and oriented to person, place, and time.  Skin: Skin is warm and dry.  Psychiatric: He has a normal mood and affect. His behavior is normal.  Nursing note and vitals reviewed.    ED Treatments / Results  DIAGNOSTIC STUDIES: Oxygen Saturation is 95% on RA, normal by my interpretation.    COORDINATION OF CARE: 9:2 AM- Pt advised of plan for treatment, which includes cardiac workup and breathing treatment, and pt agrees.  Labs (all labs ordered are listed, but only abnormal results are displayed) Labs Reviewed  BASIC METABOLIC PANEL - Abnormal; Notable for the  following:       Result Value   Glucose, Bld 132 (*)    All other components within normal limits  CBC WITH DIFFERENTIAL/PLATELET  TROPONIN I    EKG  EKG Interpretation  Date/Time:  Tuesday March 27 2016 09:05:40 EST Ventricular Rate:  82 PR Interval:    QRS Duration: 81 QT Interval:  375 QTC Calculation: 438 R Axis:   -3 Text Interpretation:  Sinus rhythm Posterior infarct, old Borderline T abnormalities, inferior leads Confirmed by Levonne Carreras  MD, Yordan Martindale ([id number]) on 03/27/2016 10:07:38 AM       Radiology No results found.  Procedures Procedures (including critical care time)  Medications Ordered in ED Medications  ipratropium-albuterol (DUONEB) 0.5-2.5 (3) MG/3ML nebulizer solution 3 mL (3 mLs Nebulization Given 03/27/16 1037)  albuterol (PROVENTIL) (2.5 MG/3ML) 0.083% nebulizer solution 2.5 mg (2.5 mg Nebulization Given 03/27/16 1037)  acetaminophen (TYLENOL) tablet 1,000 mg (1,000 mg Oral Given 03/27/16 1312)     Initial Impression / Assessment and Plan / ED Course  I have reviewed the triage vital signs and the nursing notes.  Pertinent labs & imaging results that were available during my care of the patient were reviewed by me and considered in my medical decision making (see chart for details).  Clinical Course     Patient is hemodynamically stable. Screening EKG, labs, troponin, chest x-ray all negative. Discharge medications Antivert 25 mg. He understands to return if worse.  Final Clinical Impressions(s) / ED Diagnoses   Final diagnoses:  Vertigo  Chest pain, unspecified type    New Prescriptions Discharge Medication List as of 03/30/2016 11:02 AM    START taking these medications   Details  meclizine (ANTIVERT) 25 MG tablet Take 1 tablet (25 mg total) by mouth 3 (three) times daily as needed for dizziness., Starting Tue 03/27/2016, Print       I personally performed the services described in this documentation, which was scribed in my presence. The recorded  information has been reviewed and is accurate.      Donnetta Hutching, MD 03/31/16 [phone]

## 2016-04-11 ENCOUNTER — Ambulatory Visit: Payer: Self-pay | Admitting: Family Medicine

## 2016-11-01 ENCOUNTER — Emergency Department (HOSPITAL_COMMUNITY)
Admission: EM | Admit: 2016-11-01 | Discharge: 2016-11-01 | Disposition: A | Discharge status: Home / Self Care | Payer: Self-pay | Attending: Emergency Medicine | Admitting: Emergency Medicine

## 2016-11-01 ENCOUNTER — Emergency Department (HOSPITAL_COMMUNITY): Payer: Self-pay

## 2016-11-01 ENCOUNTER — Encounter (HOSPITAL_COMMUNITY): Payer: Self-pay

## 2016-11-01 DIAGNOSIS — M25552 Pain in left hip: Secondary | ICD-10-CM | POA: Insufficient documentation

## 2016-11-01 DIAGNOSIS — F17210 Nicotine dependence, cigarettes, uncomplicated: Secondary | ICD-10-CM | POA: Insufficient documentation

## 2016-11-01 DIAGNOSIS — I10 Essential (primary) hypertension: Secondary | ICD-10-CM | POA: Insufficient documentation

## 2016-11-01 DIAGNOSIS — J449 Chronic obstructive pulmonary disease, unspecified: Secondary | ICD-10-CM | POA: Insufficient documentation

## 2016-11-01 DIAGNOSIS — M25551 Pain in right hip: Secondary | ICD-10-CM | POA: Insufficient documentation

## 2016-11-01 DIAGNOSIS — E119 Type 2 diabetes mellitus without complications: Secondary | ICD-10-CM | POA: Insufficient documentation

## 2016-11-01 MED ORDER — PREDNISONE 20 MG PO TABS: 40.0000 mg | ORAL_TABLET | Freq: Every day | ORAL | 0 refills | Status: DC

## 2016-11-01 MED ORDER — KETOROLAC TROMETHAMINE 30 MG/ML IJ SOLN
30.0000 mg | Freq: Once | INTRAMUSCULAR | Status: AC
  Administered 2016-11-01: 30 mg via INTRAMUSCULAR
  Filled 2016-11-01: qty 1

## 2016-11-01 NOTE — ED Provider Notes (Signed)
[facility] Provider Note   CSN: [id number] Arrival date & time: 11/01/16  1216     History   Chief Complaint Chief Complaint  Patient presents with  . Groin Pain    HPI Willie Herman is a 41 y.o. male.  HPI  Patient presents with pain in both inguinal creases. Pain is mild at rest, but with ambulation, weightbearing patient has sustained substantial sore pain in both inguinal creases. No associated penis pain, scrotal pain or discomfort. No abdominal pain. 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 He states that he is otherwise well. He does have a history of surgery in the right thigh as a child, but no recent surgery, no recent illness, no recent medication changes, diet changes, activity changes. Since onset he has been attempting to use aspirin for pain control without substantial relief.  Past Medical History:  Diagnosis Date  . Anxiety   . COPD (chronic obstructive pulmonary disease) (HCC)   . Depression   . diet controlled diabetes    former  . GERD (gastroesophageal reflux disease)   . High cholesterol   . Hypertension   . Suicidal ideation     There are no active problems to display for this patient.   Past Surgical History:  Procedure Laterality Date  . ABDOMINAL SURGERY    . APPENDECTOMY    . LEG SURGERY     when 12. Patient reports he was in tractor accident and leg had to be repaired  . MENISCUS REPAIR    . VASCULAR SURGERY         Home Medications    Prior to Admission medications   Medication Sig Start Date End Date Taking? Authorizing Provider  Aspirin-Salicylamide-Caffeine (BC HEADACHE POWDER PO) Take 1 packet by mouth every 4 (four) hours.    [provider]  ibuprofen (ADVIL,MOTRIN) 200 MG tablet Take 800 mg by mouth every 6 (six) hours as needed for headache or moderate pain.    [provider]  meclizine (ANTIVERT) 25 MG tablet Take 1 tablet (25 mg total) by mouth 3 (three) times daily  as needed for dizziness. 03/27/16   Donnetta Hutching, MD    Family History No family history on file.  Social History Social History  Substance Use Topics  . Smoking status: Current Every Day Smoker    Packs/day: 1.50    Types: Cigarettes  . Smokeless tobacco: Never Used  . Alcohol use Yes     Comment: occ     Allergies   Pineapple   Review of Systems Review of Systems  Constitutional:       Per HPI, otherwise negative  HENT:       Per HPI, otherwise negative  Respiratory:       Per HPI, otherwise negative  Cardiovascular:       Per HPI, otherwise negative  Gastrointestinal: Negative for vomiting.  Endocrine:       Negative aside from HPI  Genitourinary:       Neg aside from HPI   Musculoskeletal:       Per HPI, otherwise negative  Skin: Negative.   Neurological: Negative for syncope.     Physical Exam Updated Vital Signs BP (!) 145/94 (BP Location: Right Arm)   Pulse 82   Temp 98.2 F (36.8 C) (Oral)   Resp 18   Ht 5\' 11"  (1.803 m)   Wt 97.5 kg (215 lb)   SpO2 94%   BMI 29.99 kg/m  Physical Exam  Constitutional: He is oriented to person, place, and time. He appears well-developed. No distress.  HENT:  Head: Normocephalic and atraumatic.  Eyes: Conjunctivae and EOM are normal.  Cardiovascular: Normal rate and regular rhythm.   Pulmonary/Chest: Effort normal. No stridor. No respiratory distress.  Abdominal: He exhibits no distension. There is no tenderness.  Genitourinary: Penis normal.  Musculoskeletal: He exhibits no edema.  Mild tenderness to palpation about the right inferior inguinal crease without appreciable deformity.  No appreciable tenderness about the left inguinal crease.  Patient flexes each hip independently, has 5/5 strength with hip flexion against resistance  Neurological: He is alert and oriented to person, place, and time.  Skin: Skin is warm and dry.  Psychiatric: He has a normal mood and affect.  Nursing note and vitals  reviewed.    ED Treatments / Results   Radiology Dg Pelvis 1-2 Views  Result Date: 11/01/2016 CLINICAL DATA:  Acute bilateral inguinal pain with weight-bearing. Initial encounter. EXAM: PELVIS - 1-2 VIEW COMPARISON:  06/15/2006 pelvic CT FINDINGS: There is no evidence of acute fracture, subluxation or dislocation. Mild joint space narrowing in both hips noted. No other bony abnormalities are noted. IMPRESSION: No evidence of acute abnormality. Mild degenerative changes within both hips. Electronically Signed   By: Harmon PierJeffrey  Hu M.D.   On: 11/01/2016 13:29    Procedures Procedures (including critical care time)  Medications Ordered in ED Medications  ketorolac (TORADOL) 30 MG/ML injection 30 mg (not administered)     Initial Impression / Assessment and Plan / ED Course  I have reviewed the triage vital signs and the nursing notes.  Pertinent labs & imaging results that were available during my care of the patient were reviewed by me and considered in my medical decision making (see chart for details).  Well-appearing young male presents with bilateral inguinal crease pain. Patient is awake, alert, distally neurovascularly unremarkable, with no evidence for DVT, superficial infection, deep space infection. No scrotal or penis complaints, physical exam is reassuring, including ability to flex each hip independently, against resistance. Given the prescription of pain is worse with ambulation, x-ray was performed. This was reassuring aside from evidence for degenerative changes. With concern for inflammatory condition, the patient started on a short course of anti-inflammatory steroids, will follow-up with orthopedics per   Final Clinical Impressions(s) / ED Diagnoses   Final diagnoses:  Pain of both hip joints    New Prescriptions New Prescriptions   PREDNISONE (DELTASONE) 20 MG TABLET    Take 2 tablets (40 mg total) by mouth daily with breakfast. For the next four days      Gerhard MunchLockwood, Sunny Aguon, MD 11/01/16 1405

## 2016-11-01 NOTE — Discharge Instructions (Signed)
As discussed, your evaluation today has been largely reassuring.  But, it is important that you monitor your condition carefully, and do not hesitate to return to the ED if you develop new, or concerning changes in your condition. ? ?Otherwise, please follow-up with your physician for appropriate ongoing care. ? ?

## 2016-11-01 NOTE — ED Triage Notes (Signed)
Pt reports bilateral groin pain x 1 week.  Denies any injury, heavy lifting, or pulling.

## 2019-09-05 ENCOUNTER — Other Ambulatory Visit: Payer: Self-pay

## 2019-09-05 ENCOUNTER — Emergency Department (HOSPITAL_COMMUNITY)
Admission: EM | Admit: 2019-09-05 | Discharge: 2019-09-05 | Disposition: A | Discharge status: Home / Self Care | Payer: Self-pay | Attending: Emergency Medicine | Admitting: Emergency Medicine

## 2019-09-05 ENCOUNTER — Encounter (HOSPITAL_COMMUNITY): Payer: Self-pay | Admitting: Emergency Medicine

## 2019-09-05 DIAGNOSIS — F17210 Nicotine dependence, cigarettes, uncomplicated: Secondary | ICD-10-CM | POA: Insufficient documentation

## 2019-09-05 DIAGNOSIS — K047 Periapical abscess without sinus: Secondary | ICD-10-CM | POA: Insufficient documentation

## 2019-09-05 DIAGNOSIS — I10 Essential (primary) hypertension: Secondary | ICD-10-CM | POA: Insufficient documentation

## 2019-09-05 DIAGNOSIS — E119 Type 2 diabetes mellitus without complications: Secondary | ICD-10-CM | POA: Insufficient documentation

## 2019-09-05 DIAGNOSIS — J449 Chronic obstructive pulmonary disease, unspecified: Secondary | ICD-10-CM | POA: Insufficient documentation

## 2019-09-05 MED ORDER — PENICILLIN V POTASSIUM 500 MG PO TABS: 500.0000 mg | ORAL_TABLET | Freq: Four times a day (QID) | ORAL | 0 refills | Status: AC

## 2019-09-05 NOTE — ED Provider Notes (Signed)
[facility] Emergency Department Provider Note MRN:  [id number]  Arrival date & time: 09/05/19     Chief Complaint   Facial Swelling   History of Present Illness   Willie Herman is a 44 y.o. year-old male with a history of COPD, hypertension presenting to the ED with chief complaint of facial swelling.  Patient woke up with some swelling to the lower right lip.  Has been having some tenderness to the right lower molar for 1 or 2 days, seems more tender today.  Denies fever, no other complaints.  Review of Systems  A problem-focused ROS was performed. Positive for tooth pain.  Patient denies fever.  Patient's Health History    Past Medical History:  Diagnosis Date  . Anxiety   . COPD (chronic obstructive pulmonary disease) ([place])   . Depression   . diet controlled diabetes    former  . GERD (gastroesophageal reflux disease)   . High cholesterol   . Hypertension   . Suicidal ideation     Past Surgical History:  Procedure Laterality Date  . ABDOMINAL SURGERY    . APPENDECTOMY    . LEG SURGERY     when 12. Patient reports he was in tractor accident and leg had to be repaired  . MENISCUS REPAIR    . VASCULAR SURGERY      History reviewed. No pertinent family history.  Social History   Socioeconomic History  . Marital status: Legally Separated    Spouse name: Not on file  . Number of children: Not on file  . Years of education: Not on file  . Highest education level: Not on file  Occupational History  . Not on file  Tobacco Use  . Smoking status: Current Every Day Smoker    Packs/day: 1.50    Types: Cigarettes  . Smokeless tobacco: Never Used  Vaping Use  . Vaping Use: Never used  Substance and Sexual Activity  . Alcohol use: Not Currently  . Drug use: No  . Sexual activity: Not on file  Other Topics Concern  . Not on file  Social History Narrative  . Not on file   Social Determinants of Health   Financial Resource Strain:   .  Difficulty of Paying Living Expenses:   Food Insecurity:   . Worried About Charity fundraiser in the Last Year:   . Arboriculturist in the Last Year:   Transportation Needs:   . Film/video editor (Medical):   Marland Kitchen Lack of Transportation (Non-Medical):   Physical Activity:   . Days of Exercise per Week:   . Minutes of Exercise per Session:   Stress:   . Feeling of Stress :   Social Connections:   . Frequency of Communication with Friends and Family:   . Frequency of Social Gatherings with Friends and Family:   . Attends Religious Services:   . Active Member of Clubs or Organizations:   . Attends Archivist Meetings:   Marland Kitchen Marital Status:   Intimate Partner Violence:   . Fear of Current or Ex-Partner:   . Emotionally Abused:   Marland Kitchen Physically Abused:   . Sexually Abused:      Physical Exam   Vitals:   09/05/19 1841  BP: (!) 163/102  Pulse: 91  Resp: 18  Temp: 98.8 F (37.1 C)  SpO2: 98%    CONSTITUTIONAL: Well-appearing, NAD NEURO:  Alert and oriented x 3, no focal deficits EYES:  eyes  equal and reactive ENT/NECK:  no LAD, no JVD; mild tenderness to the right mandibular molar, mild swelling to the adjacent buccal mucosa CARDIO: Regular rate, well-perfused, normal S1 and S2 PULM:  CTAB no wheezing or rhonchi GI/GU:  normal bowel sounds, non-distended, non-tender MSK/SPINE:  No gross deformities, no edema SKIN:  no rash, atraumatic PSYCH:  Appropriate speech and behavior  *Additional and/or pertinent findings included in MDM below  Diagnostic and Interventional Summary    EKG Interpretation  Date/Time:    Ventricular Rate:    PR Interval:    QRS Duration:   QT Interval:    QTC Calculation:   R Axis:     Text Interpretation:        Labs Reviewed - No data to display  No orders to display    Medications - No data to display   Procedures  /  Critical Care Procedures  ED Course and Medical Decision Making  I have reviewed the triage vital  signs, the nursing notes, and pertinent available records from the EMR.  Listed above are laboratory and imaging tests that I personally ordered, reviewed, and interpreted and then considered in my medical decision making (see below for details).      No signs of gingival abscess or more significant contiguous infection, normal vital signs, seems to be an uncomplicated tooth infection.  Appropriate for discharge with antibiotics.    Elmer Sow. Pilar Plate, MD [facility] [email or web address]  Final Clinical Impressions(s) / ED Diagnoses     ICD-10-CM   1. Tooth infection  K04.7     ED Discharge Orders         Ordered    penicillin v potassium (VEETID) 500 MG tablet  4 times daily     Discontinue  Reprint     09/05/19 1906           Discharge Instructions Discussed with and Provided to Patient:     Discharge Instructions     You were evaluated in the Emergency Department and after careful evaluation, we did not find any emergent condition requiring admission or further testing in the hospital.  Your exam/testing today was overall reassuring.  Symptoms seem to be due to a tooth infection.  Please take the antibiotics as directed.  Use Tylenol or Motrin for discomfort.  We advise follow-up with a dentist.  Please return to the Emergency Department if you experience any worsening of your condition.  We encourage you to follow up with a primary care provider.  Thank you for allowing [place] to be a part of your care.       Sabas Sous, MD 09/05/19 [phone]

## 2019-09-05 NOTE — Discharge Instructions (Addendum)
You were evaluated in the Emergency Department and after careful evaluation, we did not find any emergent condition requiring admission or further testing in the hospital.  Your exam/testing today was overall reassuring.  Symptoms seem to be due to a tooth infection.  Please take the antibiotics as directed.  Use Tylenol or Motrin for discomfort.  We advise follow-up with a dentist.  Please return to the Emergency Department if you experience any worsening of your condition.  We encourage you to follow up with a primary care provider.  Thank you for allowing [place] to be a part of your care.

## 2019-09-05 NOTE — ED Triage Notes (Signed)
Pt reports right sided facial swelling and pain along lower jaw line since he woke up this morning, denies dental pain

## 2019-09-05 NOTE — ED Notes (Signed)
ED Provider at bedside. 

## 2019-09-07 ENCOUNTER — Emergency Department (HOSPITAL_COMMUNITY)
Admission: EM | Admit: 2019-09-07 | Discharge: 2019-09-07 | Disposition: A | Discharge status: Home / Self Care | Payer: Self-pay | Attending: Emergency Medicine | Admitting: Emergency Medicine

## 2019-09-07 ENCOUNTER — Other Ambulatory Visit: Payer: Self-pay

## 2019-09-07 ENCOUNTER — Encounter (HOSPITAL_COMMUNITY): Payer: Self-pay

## 2019-09-07 DIAGNOSIS — Z7982 Long term (current) use of aspirin: Secondary | ICD-10-CM | POA: Insufficient documentation

## 2019-09-07 DIAGNOSIS — K047 Periapical abscess without sinus: Secondary | ICD-10-CM

## 2019-09-07 DIAGNOSIS — Z792 Long term (current) use of antibiotics: Secondary | ICD-10-CM | POA: Insufficient documentation

## 2019-09-07 DIAGNOSIS — K0889 Other specified disorders of teeth and supporting structures: Secondary | ICD-10-CM | POA: Insufficient documentation

## 2019-09-07 DIAGNOSIS — J449 Chronic obstructive pulmonary disease, unspecified: Secondary | ICD-10-CM | POA: Insufficient documentation

## 2019-09-07 DIAGNOSIS — I10 Essential (primary) hypertension: Secondary | ICD-10-CM | POA: Insufficient documentation

## 2019-09-07 DIAGNOSIS — F17210 Nicotine dependence, cigarettes, uncomplicated: Secondary | ICD-10-CM | POA: Insufficient documentation

## 2019-09-07 MED ORDER — OXYCODONE-ACETAMINOPHEN 5-325 MG PO TABS
1.0000 | ORAL_TABLET | Freq: Once | ORAL | Status: AC
  Administered 2019-09-07: 1 via ORAL
  Filled 2019-09-07: qty 1

## 2019-09-07 MED ORDER — CLINDAMYCIN HCL 150 MG PO CAPS: 450.0000 mg | ORAL_CAPSULE | Freq: Three times a day (TID) | ORAL | 0 refills | Status: AC

## 2019-09-07 NOTE — ED Triage Notes (Signed)
Arrival via EMS with complaint of right jaw pain due to dental abcess per patient. Awakened this am at 9:30 with increased pain. Per EMS pt was hypertensive but improved during transport.

## 2019-09-07 NOTE — Discharge Instructions (Signed)
You are seen today for tooth infection.  Stop taking the penicillin and use the clindamycin as directed.  3 times a day.  I want you to make sure you eat and drink when taking this.  Printed out a Games developer card, I really need you to see a dentist as we discussed.  Please come back to the emergency department if you cannot move your mouth, you start drooling, fevers, you have new or worsening symptoms.  You can take over-the-counter ibuprofen for this as needed for the pain.

## 2019-09-07 NOTE — ED Provider Notes (Signed)
[facility] EMERGENCY DEPARTMENT Provider Note   CSN: [id number] Arrival date & time: 09/07/19  1041     History Chief Complaint  Patient presents with  . Dental Pain    Willie Herman is a 44 y.o. male with pertinent past medical history of COPD, hypertension, hypercholesterol, depression, anxiety that presents the emergency department today via EMS for dental pain.  Was seen 2 days ago in the ED for the same.  Was prescribed penicillin.  Patient states that today his facial swelling has become worse along with his pain.  Has been taking penicillin as prescribed.  States that it hurts to chew.  Denies any dysphagia, odynophagia, headaches, vision changes, weakness, trismus, drooling, trouble breathing.  States that he was told to see a dentist, states he does not have funds to do that right now.  States that he just got out of jail and cannot afford to go see a dentist.  Patient states that he was taking blood pressure medication at jail, does not know what this was.  Did not take blood pressure medication today because he was in pain.  Does not remember the name of this.  Denies any chest pain, shortness of breath.  Denies fevers, chills.  Denies pain elsewhere.  HPI     Past Medical History:  Diagnosis Date  . Anxiety   . COPD (chronic obstructive pulmonary disease) (HCC)   . Depression   . diet controlled diabetes    former  . GERD (gastroesophageal reflux disease)   . High cholesterol   . Hypertension   . Suicidal ideation     There are no problems to display for this patient.   Past Surgical History:  Procedure Laterality Date  . ABDOMINAL SURGERY    . APPENDECTOMY    . LEG SURGERY     when 12. Patient reports he was in tractor accident and leg had to be repaired  . MENISCUS REPAIR    . VASCULAR SURGERY         No family history on file.  Social History   Tobacco Use  . Smoking status: Current Every Day Smoker    Packs/day: 1.50    Types: Cigarettes  .  Smokeless tobacco: Never Used  Vaping Use  . Vaping Use: Never used  Substance Use Topics  . Alcohol use: Not Currently  . Drug use: No    Home Medications Prior to Admission medications   Medication Sig Start Date End Date Taking? Authorizing Provider  Aspirin-Salicylamide-Caffeine (BC HEADACHE POWDER PO) Take 1 packet by mouth every 4 (four) hours.    [provider]  clindamycin (CLEOCIN) 150 MG capsule Take 3 capsules (450 mg total) by mouth 3 (three) times daily for 7 days. 09/07/19 09/14/19  Farrel Gordon, PA-C  ibuprofen (ADVIL,MOTRIN) 200 MG tablet Take 800 mg by mouth every 6 (six) hours as needed for headache or moderate pain.    [provider]  meclizine (ANTIVERT) 25 MG tablet Take 1 tablet (25 mg total) by mouth 3 (three) times daily as needed for dizziness. Patient not taking: Reported on 11/01/2016 03/27/16   Donnetta Hutching, MD  penicillin v potassium (VEETID) 500 MG tablet Take 1 tablet (500 mg total) by mouth 4 (four) times daily for 7 days. 09/05/19 09/12/19  Sabas Sous, MD  predniSONE (DELTASONE) 20 MG tablet Take 2 tablets (40 mg total) by mouth daily with breakfast. For the next four days 11/01/16   Gerhard Munch, MD  citalopram [facility])  20 MG tablet Take 20 mg by mouth daily.  06/04/11  [provider]    Allergies    Pineapple  Review of Systems   Review of Systems  Constitutional: Negative for chills, diaphoresis, fatigue and fever.  HENT: Positive for dental problem and facial swelling. Negative for congestion, sore throat and trouble swallowing.   Eyes: Negative for pain and visual disturbance.  Respiratory: Negative for cough, shortness of breath and wheezing.   Cardiovascular: Negative for chest pain, palpitations and leg swelling.  Gastrointestinal: Negative for abdominal distention, abdominal pain, diarrhea, nausea and vomiting.  Genitourinary: Negative for difficulty urinating.  Musculoskeletal: Negative for back pain, neck pain  and neck stiffness.  Skin: Negative for pallor.  Neurological: Negative for dizziness, speech difficulty, weakness and headaches.  Psychiatric/Behavioral: Negative for confusion.    Physical Exam Updated Vital Signs BP (!) 144/96 (BP Location: Right Arm)   Pulse 72   Temp 98.5 F (36.9 C) (Oral)   Resp 16   Ht 6' (1.829 m)   Wt 99.8 kg   SpO2 97%   BMI 29.84 kg/m   Physical Exam Constitutional:      General: He is not in acute distress.    Appearance: Normal appearance. He is not ill-appearing, toxic-appearing or diaphoretic.  HENT:     Head:     Comments: Facial swelling noted on patient's right cheek. No sings of overlying skin infection. No warmth, tender to touch.    Mouth/Throat:     Mouth: Mucous membranes are moist.     Dentition: Abnormal dentition. Does not have dentures. Dental tenderness and dental caries present. No gingival swelling, dental abscesses or gum lesions.     Tongue: No lesions. Tongue does not deviate from midline.     Palate: No mass and lesions.     Pharynx: Oropharynx is clear. Uvula midline. No pharyngeal swelling, oropharyngeal exudate, posterior oropharyngeal erythema or uvula swelling.     Tonsils: No tonsillar exudate or tonsillar abscesses.      Comments: No Swelling under tongue. Pt is handling secretions. No sings of airway compromise.  Eyes:     General: No scleral icterus.    Extraocular Movements: Extraocular movements intact.     Pupils: Pupils are equal, round, and reactive to light.  Cardiovascular:     Rate and Rhythm: Normal rate and regular rhythm.     Pulses: Normal pulses.     Heart sounds: Normal heart sounds.  Pulmonary:     Effort: Pulmonary effort is normal. No respiratory distress.     Breath sounds: Normal breath sounds. No stridor. No wheezing, rhonchi or rales.  Chest:     Chest wall: No tenderness.  Abdominal:     General: Abdomen is flat. There is no distension.     Palpations: Abdomen is soft.     Tenderness:  There is no abdominal tenderness. There is no guarding or rebound.  Musculoskeletal:        General: No swelling or tenderness. Normal range of motion.     Cervical back: Normal range of motion and neck supple. No rigidity.     Right lower leg: No edema.     Left lower leg: No edema.  Skin:    General: Skin is warm and dry.     Capillary Refill: Capillary refill takes less than 2 seconds.     Coloration: Skin is not pale.  Neurological:     General: No focal deficit present.     Mental  Status: He is alert and oriented to person, place, and time.  Psychiatric:        Mood and Affect: Mood normal.        Behavior: Behavior normal.     ED Results / Procedures / Treatments   Labs (all labs ordered are listed, but only abnormal results are displayed) Labs Reviewed - No data to display  EKG None  Radiology No results found.  Procedures Procedures (including critical care time)  Medications Ordered in ED Medications  oxyCODONE-acetaminophen (PERCOCET/ROXICET) 5-325 MG per tablet 1 tablet (1 tablet Oral Given 09/07/19 1313)    ED Course  I have reviewed the triage vital signs and the nursing notes.  Pertinent labs & imaging results that were available during my care of the patient were reviewed by me and considered in my medical decision making (see chart for details).    MDM Rules/Calculators/A&P                          Willie Herman is a 44 y.o. male with pertinent past medical history of COPD, hypertension, hypercholesterol, depression, anxiety that presents the emergency department today via EMS for dental pain.  Was seen 2 days ago in the ED for the same.  Was prescribed penicillin. Pt states that pain is worse today, has not seen dentist and has no intentions too. No trismus or drooling, no muffled voice, handling secretions.  Patient is able to open mouth without any pain.  No sings of [place]. Patient does express pain in right lower area of his mouth, no signs of abcess.   Multiple dental caries in mouth.  Patient does have facial swelling.  Patient has been compliant with penicillin, will escalate to clindamycin at this time.  Patient states that he will not see a dentist.  Expressed that importance of patient needing to see dentist. Pt states that he does not want to be referred to dentist, but wil take dental resource guide.  Patient's blood pressure improved with Percocet on board patient to be discharged at this time.  .Doubt need for further emergent work up at this time. I explained the diagnosis and have given explicit precautions to return to the ER including for any other new or worsening symptoms. The patient understands and accepts the medical plan as it's been dictated and I have answered their questions. Discharge instructions concerning home care and prescriptions have been given. The patient is STABLE and is discharged to home in good condition.  .I discussed this case with my attending physician who cosigned this note including patient's presenting symptoms, physical exam, and planned diagnostics and interventions. Attending physician stated agreement with plan or made changes to plan which were implemented.   Final Clinical Impression(s) / ED Diagnoses Final diagnoses:  Dental infection    Rx / DC Orders ED Discharge Orders         Ordered    clindamycin (CLEOCIN) 150 MG capsule  3 times daily     Discontinue  Reprint     09/07/19 1415           Farrel Gordon, PA-C 09/08/19 1222    Derwood Kaplan, MD 09/11/19 1459

## 2019-10-19 ENCOUNTER — Other Ambulatory Visit: Payer: Self-pay

## 2019-10-19 ENCOUNTER — Encounter (HOSPITAL_COMMUNITY): Payer: Self-pay

## 2019-10-19 DIAGNOSIS — Z7982 Long term (current) use of aspirin: Secondary | ICD-10-CM | POA: Insufficient documentation

## 2019-10-19 DIAGNOSIS — E860 Dehydration: Secondary | ICD-10-CM | POA: Insufficient documentation

## 2019-10-19 DIAGNOSIS — I10 Essential (primary) hypertension: Secondary | ICD-10-CM | POA: Insufficient documentation

## 2019-10-19 DIAGNOSIS — F17210 Nicotine dependence, cigarettes, uncomplicated: Secondary | ICD-10-CM | POA: Insufficient documentation

## 2019-10-19 DIAGNOSIS — Z79899 Other long term (current) drug therapy: Secondary | ICD-10-CM | POA: Insufficient documentation

## 2019-10-19 DIAGNOSIS — J449 Chronic obstructive pulmonary disease, unspecified: Secondary | ICD-10-CM | POA: Insufficient documentation

## 2019-10-19 DIAGNOSIS — T675XXA Heat exhaustion, unspecified, initial encounter: Secondary | ICD-10-CM | POA: Insufficient documentation

## 2019-10-19 DIAGNOSIS — E119 Type 2 diabetes mellitus without complications: Secondary | ICD-10-CM | POA: Insufficient documentation

## 2019-10-19 LAB — CBC WITH DIFFERENTIAL/PLATELET
Abs Immature Granulocytes: 0.04 10*3/uL (ref 0.00–0.07)
Basophils Absolute: 0 10*3/uL (ref 0.0–0.1)
Basophils Relative: 0 %
Eosinophils Absolute: 0.1 10*3/uL (ref 0.0–0.5)
Eosinophils Relative: 1 %
HCT: 43.5 % (ref 39.0–52.0)
Hemoglobin: 15.1 g/dL (ref 13.0–17.0)
Immature Granulocytes: 0 %
Lymphocytes Relative: 18 %
Lymphs Abs: 2 10*3/uL (ref 0.7–4.0)
MCH: 30.5 pg (ref 26.0–34.0)
MCHC: 34.7 g/dL (ref 30.0–36.0)
MCV: 87.9 fL (ref 80.0–100.0)
Monocytes Absolute: 0.9 10*3/uL (ref 0.1–1.0)
Monocytes Relative: 8 %
Neutro Abs: 8.2 10*3/uL — ABNORMAL HIGH (ref 1.7–7.7)
Neutrophils Relative %: 73 %
Platelets: 309 10*3/uL (ref 150–400)
RBC: 4.95 MIL/uL (ref 4.22–5.81)
RDW: 12.4 % (ref 11.5–15.5)
WBC: 11.2 10*3/uL — ABNORMAL HIGH (ref 4.0–10.5)
nRBC: 0 % (ref 0.0–0.2)

## 2019-10-19 LAB — COMPREHENSIVE METABOLIC PANEL
ALT: 23 U/L (ref 0–44)
AST: 32 U/L (ref 15–41)
Albumin: 4.8 g/dL (ref 3.5–5.0)
Alkaline Phosphatase: 58 U/L (ref 38–126)
Anion gap: 11 (ref 5–15)
BUN: 15 mg/dL (ref 6–20)
CO2: 24 mmol/L (ref 22–32)
Calcium: 9.4 mg/dL (ref 8.9–10.3)
Chloride: 100 mmol/L (ref 98–111)
Creatinine, Ser: 1.11 mg/dL (ref 0.61–1.24)
GFR calc Af Amer: >60 mL/min (ref >60)
GFR calc non Af Amer: >60 mL/min (ref >60)
Glucose, Bld: 158 mg/dL — ABNORMAL HIGH (ref 70–99)
Potassium: 4.1 mmol/L (ref 3.5–5.1)
Sodium: 135 mmol/L (ref 135–145)
Total Bilirubin: 1 mg/dL (ref 0.3–1.2)
Total Protein: 7.3 g/dL (ref 6.5–8.1)

## 2019-10-19 NOTE — ED Triage Notes (Signed)
Pt reports being dizzy after walking around town all day. Sts he drank 2 waters today and then called EMS. Refused IV fluids that were offered by EMS.

## 2019-10-20 ENCOUNTER — Emergency Department (HOSPITAL_COMMUNITY)
Admission: EM | Admit: 2019-10-20 | Discharge: 2019-10-20 | Disposition: A | Discharge status: Home / Self Care | Payer: Self-pay | Attending: Emergency Medicine | Admitting: Emergency Medicine

## 2019-10-20 ENCOUNTER — Inpatient Hospital Stay (HOSPITAL_COMMUNITY)
Admission: EM | Admit: 2019-10-20 | Discharge: 2019-10-24 | DRG: 558 | Disposition: A | Discharge status: Home / Self Care | Payer: Self-pay | Attending: Student in an Organized Health Care Education/Training Program | Admitting: Student in an Organized Health Care Education/Training Program

## 2019-10-20 ENCOUNTER — Encounter (HOSPITAL_COMMUNITY): Payer: Self-pay | Admitting: Emergency Medicine

## 2019-10-20 DIAGNOSIS — Z5900 Homelessness unspecified: Secondary | ICD-10-CM

## 2019-10-20 DIAGNOSIS — Z20822 Contact with and (suspected) exposure to covid-19: Secondary | ICD-10-CM | POA: Diagnosis present

## 2019-10-20 DIAGNOSIS — T675XXA Heat exhaustion, unspecified, initial encounter: Secondary | ICD-10-CM

## 2019-10-20 DIAGNOSIS — E860 Dehydration: Secondary | ICD-10-CM | POA: Diagnosis present

## 2019-10-20 DIAGNOSIS — J449 Chronic obstructive pulmonary disease, unspecified: Secondary | ICD-10-CM | POA: Diagnosis present

## 2019-10-20 DIAGNOSIS — E7800 Pure hypercholesterolemia, unspecified: Secondary | ICD-10-CM | POA: Diagnosis present

## 2019-10-20 DIAGNOSIS — R748 Abnormal levels of other serum enzymes: Secondary | ICD-10-CM | POA: Diagnosis present

## 2019-10-20 DIAGNOSIS — M6282 Rhabdomyolysis: Principal | ICD-10-CM | POA: Diagnosis present

## 2019-10-20 DIAGNOSIS — K219 Gastro-esophageal reflux disease without esophagitis: Secondary | ICD-10-CM | POA: Diagnosis present

## 2019-10-20 DIAGNOSIS — E785 Hyperlipidemia, unspecified: Secondary | ICD-10-CM | POA: Diagnosis present

## 2019-10-20 DIAGNOSIS — Z79899 Other long term (current) drug therapy: Secondary | ICD-10-CM

## 2019-10-20 DIAGNOSIS — E1165 Type 2 diabetes mellitus with hyperglycemia: Secondary | ICD-10-CM | POA: Diagnosis present

## 2019-10-20 DIAGNOSIS — Z91018 Allergy to other foods: Secondary | ICD-10-CM

## 2019-10-20 DIAGNOSIS — F17210 Nicotine dependence, cigarettes, uncomplicated: Secondary | ICD-10-CM | POA: Diagnosis present

## 2019-10-20 DIAGNOSIS — I10 Essential (primary) hypertension: Secondary | ICD-10-CM | POA: Diagnosis present

## 2019-10-20 MED ORDER — LACTATED RINGERS IV BOLUS
1000.0000 mL | Freq: Once | INTRAVENOUS | Status: AC
Start: 2019-10-20 — End: 2019-10-20
  Administered 2019-10-20: 1000 mL via INTRAVENOUS

## 2019-10-20 MED ORDER — ACETAMINOPHEN 325 MG PO TABS
650.0000 mg | ORAL_TABLET | Freq: Once | ORAL | Status: AC
  Administered 2019-10-20: 650 mg via ORAL
  Filled 2019-10-20: qty 2

## 2019-10-20 NOTE — ED Provider Notes (Addendum)
[facility]-EMERGENCY DEPT Provider Note   CSN: [id number] Arrival date & time: 10/19/19  1922     History Chief Complaint  Patient presents with  . Dizziness    Willie Herman is a 44 y.o. male.  Patient is a 44 year old male with a history of COPD, depression, hypertension, hyperlipidemia who presents today with multiple complaints.  Patient reports that he actually is from [place] but his girlfriend kicked him out and he came down here to try to find his son because he has nowhere to stay.  Patient reports that yesterday for 12 hours from 7 AM to 7 PM he was walking around outside trying to find his son because he does not know exactly where he lives.  His son's phone is turned off and he reports the only way he can contact him is through [employer] but he is unable to find somewhere where he can get on the Internet to get on Facebook.  He reports by about 7:00 he was starting to feel lightheaded with standing have intermittent palpitations and have a headache.  He initially stated that he was short of breath but that has now resolved as he is waited in the waiting room for approximately 12 hours.  He reports now he just has a headache and hurts all over.  He denies any chest pain, shortness of breath, abdominal pain, nausea or vomiting at this time.  He states he has not eaten in 3 days and really would like to contact his son so that he has a place to stay.  He does smoke cigarettes but denies alcohol or drug use.  Patient states that he refused fluid by EMS because they were offering to treat him and let him go and he felt like he needed to be evaluated the hospital.  The history is provided by the patient.  Dizziness Quality:  Lightheadedness      Past Medical History:  Diagnosis Date  . Anxiety   . COPD (chronic obstructive pulmonary disease) (HCC)   . Depression   . diet controlled diabetes    former  . GERD (gastroesophageal reflux disease)   . High  cholesterol   . Hypertension   . Suicidal ideation     There are no problems to display for this patient.   Past Surgical History:  Procedure Laterality Date  . ABDOMINAL SURGERY    . APPENDECTOMY    . LEG SURGERY     when 12. Patient reports he was in tractor accident and leg had to be repaired  . MENISCUS REPAIR    . VASCULAR SURGERY         No family history on file.  Social History   Tobacco Use  . Smoking status: Current Every Day Smoker    Packs/day: 1.50    Types: Cigarettes  . Smokeless tobacco: Never Used  Vaping Use  . Vaping Use: Never used  Substance Use Topics  . Alcohol use: Not Currently  . Drug use: No    Home Medications Prior to Admission medications   Medication Sig Start Date End Date Taking? Authorizing Provider  Aspirin-Salicylamide-Caffeine (BC HEADACHE POWDER PO) Take 1 packet by mouth every 4 (four) hours.    [provider]  ibuprofen (ADVIL,MOTRIN) 200 MG tablet Take 800 mg by mouth every 6 (six) hours as needed for headache or moderate pain.    [provider]  meclizine (ANTIVERT) 25 MG tablet Take 1 tablet (25 mg total) by mouth  3 (three) times daily as needed for dizziness. Patient not taking: Reported on 11/01/2016 03/27/16   Donnetta Hutching, MD  predniSONE (DELTASONE) 20 MG tablet Take 2 tablets (40 mg total) by mouth daily with breakfast. For the next four days 11/01/16   Gerhard Munch, MD  citalopram (CELEXA) 20 MG tablet Take 20 mg by mouth daily.  06/04/11  [provider]    Allergies    Pineapple  Review of Systems   Review of Systems  Neurological: Positive for dizziness.  All other systems reviewed and are negative.   Physical Exam Updated Vital Signs BP (!) 142/95 (BP Location: Left Arm)   Pulse 72   Temp 97.7 F (36.5 C) (Oral)   Resp 17   Ht 6' (1.829 m)   Wt (!) 93 kg   SpO2 99%   BMI 27.80 kg/m   Physical Exam Vitals and nursing note reviewed.  Constitutional:      General: He is  not in acute distress.    Appearance: Normal appearance. He is well-developed and normal weight. He is not ill-appearing.  HENT:     Head: Normocephalic and atraumatic.  Eyes:     Extraocular Movements: Extraocular movements intact.     Conjunctiva/sclera: Conjunctivae normal.     Pupils: Pupils are equal, round, and reactive to light.  Cardiovascular:     Rate and Rhythm: Normal rate and regular rhythm.     Heart sounds: No murmur heard.   Pulmonary:     Effort: Pulmonary effort is normal. No respiratory distress.     Breath sounds: Normal breath sounds. No wheezing or rales.  Abdominal:     General: There is no distension.     Palpations: Abdomen is soft.     Tenderness: There is no abdominal tenderness. There is no guarding or rebound.  Musculoskeletal:        General: No tenderness. Normal range of motion.     Cervical back: Normal range of motion and neck supple.     Right lower leg: No edema.     Left lower leg: No edema.  Skin:    General: Skin is warm and dry.     Findings: No erythema or rash.  Neurological:     General: No focal deficit present.     Mental Status: He is alert and oriented to person, place, and time. Mental status is at baseline.  Psychiatric:        Mood and Affect: Mood normal.        Behavior: Behavior normal.        Thought Content: Thought content normal.      ED Results / Procedures / Treatments   Labs (all labs ordered are listed, but only abnormal results are displayed) Labs Reviewed  CBC WITH DIFFERENTIAL/PLATELET - Abnormal; Notable for the following components:      Result Value   WBC 11.2 (*)    Neutro Abs 8.2 (*)    All other components within normal limits  COMPREHENSIVE METABOLIC PANEL - Abnormal; Notable for the following components:   Glucose, Bld 158 (*)    All other components within normal limits    EKG None  Radiology No results found.  Procedures Procedures (including critical care time)  Medications Ordered  in ED Medications  lactated ringers bolus 1,000 mL (1,000 mLs Intravenous New Bag/Given 10/20/19 0805)  acetaminophen (TYLENOL) tablet 650 mg (650 mg Oral Given 10/20/19 0805)    ED Course  I have reviewed the  triage vital signs and the nursing notes.  Pertinent labs & imaging results that were available during my care of the patient were reviewed by me and considered in my medical decision making (see chart for details).    MDM Rules/Calculators/A&P                          Patient is a 44 year old male presenting today with symptoms most classic for heat exhaustion and dehydration.  Patient was outside for 12 hours yesterday walking around and most likely became overheated.  Creatinine has increased from 0.6-1.1.  However the rest of the CBC, CMP are within normal limits.  EKG without acute change.  Patient is otherwise well-appearing.  Will give Tylenol for headache and 1 L of IV fluids.  Will consult transitional care team to see if we can contact patient's son for plan at discharge.  Patient denies any psychiatric symptoms at this time do not feel that he needs further evaluation in this regard.  10:45 AM Pt clear for d/c at this time.   MDM Number of Diagnoses or Management Options   Amount and/or Complexity of Data Reviewed Clinical lab tests: ordered and reviewed Tests in the medicine section of CPT: ordered and reviewed Decide to obtain previous medical records or to obtain history from someone other than the patient: yes Obtain history from someone other than the patient: no Review and summarize past medical records: yes Discuss the patient with other providers: no Independent visualization of images, tracings, or specimens: yes  Risk of Complications, Morbidity, and/or Mortality Presenting problems: moderate Diagnostic procedures: minimal Management options: low  Patient Progress Patient progress: improved  Final Clinical Impression(s) / ED Diagnoses Final  diagnoses:  Dehydration  Heat exhaustion, initial encounter    Rx / DC Orders ED Discharge Orders    None       Gwyneth Sprout, MD 10/20/19 2025    Gwyneth Sprout, MD 10/20/19 1045

## 2019-10-20 NOTE — Discharge Instructions (Addendum)
Stay cool and avoid extreme heat.

## 2019-10-20 NOTE — ED Triage Notes (Signed)
Pt arrives via [facility] with c/o of dehydration after being outside all day with limited water. Pt is currently homeless.

## 2019-10-20 NOTE — Social Work (Signed)
Consult request has been received. CSW attempting to follow up at present time.   CSW will continue to follow for dc needs.  Brent Noto Tarpley-Carter, MSW, LCSW-A [facility] ED Transitions of CareClinical Social Worker [place] [phone]

## 2019-10-21 ENCOUNTER — Encounter (HOSPITAL_COMMUNITY): Payer: Self-pay | Admitting: Internal Medicine

## 2019-10-21 DIAGNOSIS — M6282 Rhabdomyolysis: Secondary | ICD-10-CM | POA: Diagnosis present

## 2019-10-21 LAB — CBC WITH DIFFERENTIAL/PLATELET
Abs Immature Granulocytes: 0.02 10*3/uL (ref 0.00–0.07)
Basophils Absolute: 0 10*3/uL (ref 0.0–0.1)
Basophils Relative: 0 %
Eosinophils Absolute: 0.2 10*3/uL (ref 0.0–0.5)
Eosinophils Relative: 4 %
HCT: 41.3 % (ref 39.0–52.0)
Hemoglobin: 14 g/dL (ref 13.0–17.0)
Immature Granulocytes: 0 %
Lymphocytes Relative: 40 %
Lymphs Abs: 2.3 10*3/uL (ref 0.7–4.0)
MCH: 30.4 pg (ref 26.0–34.0)
MCHC: 33.9 g/dL (ref 30.0–36.0)
MCV: 89.6 fL (ref 80.0–100.0)
Monocytes Absolute: 0.6 10*3/uL (ref 0.1–1.0)
Monocytes Relative: 10 %
Neutro Abs: 2.6 10*3/uL (ref 1.7–7.7)
Neutrophils Relative %: 46 %
Platelets: 244 10*3/uL (ref 150–400)
RBC: 4.61 MIL/uL (ref 4.22–5.81)
RDW: 12.2 % (ref 11.5–15.5)
WBC: 5.7 10*3/uL (ref 4.0–10.5)
nRBC: 0 % (ref 0.0–0.2)

## 2019-10-21 LAB — RAPID URINE DRUG SCREEN, HOSP PERFORMED
Amphetamines: POSITIVE — AB
Barbiturates: NOT DETECTED
Benzodiazepines: NOT DETECTED
Cocaine: NOT DETECTED
Opiates: NOT DETECTED
Tetrahydrocannabinol: NOT DETECTED

## 2019-10-21 LAB — COMPREHENSIVE METABOLIC PANEL
ALT: 22 U/L (ref 0–44)
ALT: 19 U/L (ref 0–44)
ALT: 19 U/L (ref 0–44)
AST: 66 U/L — ABNORMAL HIGH (ref 15–41)
AST: 52 U/L — ABNORMAL HIGH (ref 15–41)
AST: 56 U/L — ABNORMAL HIGH (ref 15–41)
Albumin: 3.8 g/dL (ref 3.5–5.0)
Albumin: 3.2 g/dL — ABNORMAL LOW (ref 3.5–5.0)
Albumin: 3.1 g/dL — ABNORMAL LOW (ref 3.5–5.0)
Alkaline Phosphatase: 53 U/L (ref 38–126)
Alkaline Phosphatase: 44 U/L (ref 38–126)
Alkaline Phosphatase: 44 U/L (ref 38–126)
Anion gap: 7 (ref 5–15)
Anion gap: 9 (ref 5–15)
Anion gap: 5 (ref 5–15)
BUN: 12 mg/dL (ref 6–20)
BUN: 10 mg/dL (ref 6–20)
BUN: 11 mg/dL (ref 6–20)
CO2: 26 mmol/L (ref 22–32)
CO2: 25 mmol/L (ref 22–32)
CO2: 27 mmol/L (ref 22–32)
Calcium: 8.8 mg/dL — ABNORMAL LOW (ref 8.9–10.3)
Calcium: 8.5 mg/dL — ABNORMAL LOW (ref 8.9–10.3)
Calcium: 8.6 mg/dL — ABNORMAL LOW (ref 8.9–10.3)
Chloride: 104 mmol/L (ref 98–111)
Chloride: 104 mmol/L (ref 98–111)
Chloride: 104 mmol/L (ref 98–111)
Creatinine, Ser: 0.84 mg/dL (ref 0.61–1.24)
Creatinine, Ser: 0.84 mg/dL (ref 0.61–1.24)
Creatinine, Ser: 1.09 mg/dL (ref 0.61–1.24)
GFR calc Af Amer: >60 mL/min (ref >60)
GFR calc Af Amer: >60 mL/min (ref >60)
GFR calc Af Amer: >60 mL/min (ref >60)
GFR calc non Af Amer: >60 mL/min (ref >60)
GFR calc non Af Amer: >60 mL/min (ref >60)
GFR calc non Af Amer: >60 mL/min (ref >60)
Glucose, Bld: 124 mg/dL — ABNORMAL HIGH (ref 70–99)
Glucose, Bld: 134 mg/dL — ABNORMAL HIGH (ref 70–99)
Glucose, Bld: 118 mg/dL — ABNORMAL HIGH (ref 70–99)
Potassium: 3.6 mmol/L (ref 3.5–5.1)
Potassium: 3.7 mmol/L (ref 3.5–5.1)
Potassium: 4.3 mmol/L (ref 3.5–5.1)
Sodium: 137 mmol/L (ref 135–145)
Sodium: 138 mmol/L (ref 135–145)
Sodium: 136 mmol/L (ref 135–145)
Total Bilirubin: 0.9 mg/dL (ref 0.3–1.2)
Total Bilirubin: 0.6 mg/dL (ref 0.3–1.2)
Total Bilirubin: 0.5 mg/dL (ref 0.3–1.2)
Total Protein: 5.8 g/dL — ABNORMAL LOW (ref 6.5–8.1)
Total Protein: 4.9 g/dL — ABNORMAL LOW (ref 6.5–8.1)
Total Protein: 4.9 g/dL — ABNORMAL LOW (ref 6.5–8.1)

## 2019-10-21 LAB — URINALYSIS, ROUTINE W REFLEX MICROSCOPIC
Bilirubin Urine: NEGATIVE
Glucose, UA: 50 mg/dL — AB
Hgb urine dipstick: NEGATIVE
Ketones, ur: NEGATIVE mg/dL
Leukocytes,Ua: NEGATIVE
Nitrite: NEGATIVE
Protein, ur: NEGATIVE mg/dL
Specific Gravity, Urine: 1.029 (ref 1.005–1.030)
pH: 6 (ref 5.0–8.0)

## 2019-10-21 LAB — HEMOGLOBIN A1C
Hgb A1c MFr Bld: 6.1 % — ABNORMAL HIGH (ref 4.8–5.6)
Mean Plasma Glucose: 128.37 mg/dL

## 2019-10-21 LAB — LIPID PANEL
Cholesterol: 124 mg/dL (ref 0–200)
HDL: 23 mg/dL — ABNORMAL LOW (ref >40)
LDL Cholesterol: 83 mg/dL (ref 0–99)
Total CHOL/HDL Ratio: 5.4 RATIO
Triglycerides: 92 mg/dL (ref <150)
VLDL: 18 mg/dL (ref 0–40)

## 2019-10-21 LAB — HIV ANTIBODY (ROUTINE TESTING W REFLEX): HIV Screen 4th Generation wRfx: NONREACTIVE

## 2019-10-21 LAB — MAGNESIUM: Magnesium: 1.6 mg/dL — ABNORMAL LOW (ref 1.7–2.4)

## 2019-10-21 LAB — CK
Total CK: 6147 U/L — ABNORMAL HIGH (ref 49–397)
Total CK: 4525 U/L — ABNORMAL HIGH (ref 49–397)
Total CK: 4675 U/L — ABNORMAL HIGH (ref 49–397)
Total CK: 4893 U/L — ABNORMAL HIGH (ref 49–397)

## 2019-10-21 LAB — PHOSPHORUS: Phosphorus: 3.1 mg/dL (ref 2.5–4.6)

## 2019-10-21 LAB — SARS CORONAVIRUS 2 BY RT PCR (DIASORIN): SARS Coronavirus 2: NEGATIVE

## 2019-10-21 MED ORDER — ONDANSETRON HCL 4 MG PO TABS: 4.0000 mg | ORAL_TABLET | Freq: Four times a day (QID) | ORAL | Status: DC | PRN

## 2019-10-21 MED ORDER — MAGNESIUM SULFATE 4 GM/100ML IV SOLN
4.0000 g | Freq: Once | INTRAVENOUS | Status: AC
  Administered 2019-10-21: 4 g via INTRAVENOUS
  Filled 2019-10-21: qty 100

## 2019-10-21 MED ORDER — ONDANSETRON HCL 4 MG/2ML IJ SOLN: 4.0000 mg | Freq: Four times a day (QID) | INTRAMUSCULAR | Status: DC | PRN

## 2019-10-21 MED ORDER — NICOTINE 21 MG/24HR TD PT24
21.0000 mg | MEDICATED_PATCH | Freq: Every day | TRANSDERMAL | Status: DC
  Administered 2019-10-21 – 2019-10-24 (×4): 21 mg via TRANSDERMAL
  Filled 2019-10-21 (×4): qty 1

## 2019-10-21 MED ORDER — ALBUTEROL SULFATE (2.5 MG/3ML) 0.083% IN NEBU: 3.0000 mL | INHALATION_SOLUTION | Freq: Four times a day (QID) | RESPIRATORY_TRACT | Status: DC | PRN

## 2019-10-21 MED ORDER — SENNOSIDES-DOCUSATE SODIUM 8.6-50 MG PO TABS
1.0000 | ORAL_TABLET | Freq: Every evening | ORAL | Status: DC | PRN
  Administered 2019-10-23: 1 via ORAL
  Filled 2019-10-21: qty 1

## 2019-10-21 MED ORDER — ACETAMINOPHEN 650 MG RE SUPP: 650.0000 mg | Freq: Four times a day (QID) | RECTAL | Status: DC | PRN

## 2019-10-21 MED ORDER — ENOXAPARIN SODIUM 40 MG/0.4ML ~~LOC~~ SOLN
40.0000 mg | SUBCUTANEOUS | Status: DC
  Administered 2019-10-21 – 2019-10-24 (×4): 40 mg via SUBCUTANEOUS
  Filled 2019-10-21 (×4): qty 0.4

## 2019-10-21 MED ORDER — LACTATED RINGERS IV SOLN: INTRAVENOUS | Status: DC

## 2019-10-21 MED ORDER — SODIUM CHLORIDE 0.9 % IV BOLUS
1000.0000 mL | Freq: Once | INTRAVENOUS | Status: AC
  Administered 2019-10-21: 1000 mL via INTRAVENOUS

## 2019-10-21 MED ORDER — AMLODIPINE BESYLATE 2.5 MG PO TABS
2.5000 mg | ORAL_TABLET | Freq: Every day | ORAL | Status: DC
  Administered 2019-10-22 – 2019-10-24 (×3): 2.5 mg via ORAL
  Filled 2019-10-21 (×4): qty 1

## 2019-10-21 MED ORDER — ACETAMINOPHEN 325 MG PO TABS
650.0000 mg | ORAL_TABLET | Freq: Four times a day (QID) | ORAL | Status: DC | PRN
  Administered 2019-10-23: 650 mg via ORAL
  Filled 2019-10-21: qty 2

## 2019-10-21 MED ORDER — HYDRALAZINE HCL 20 MG/ML IJ SOLN: 5.0000 mg | Freq: Three times a day (TID) | INTRAMUSCULAR | Status: DC | PRN

## 2019-10-21 MED ORDER — AMLODIPINE BESYLATE 5 MG PO TABS
5.0000 mg | ORAL_TABLET | Freq: Every day | ORAL | Status: DC
  Administered 2019-10-21: 5 mg via ORAL
  Filled 2019-10-21: qty 1

## 2019-10-21 NOTE — ED Notes (Signed)
Report called to floor

## 2019-10-21 NOTE — ED Notes (Signed)
Breakfast tray ordered 

## 2019-10-21 NOTE — H&P (Signed)
Date: 10/21/2019               Patient Name:  Willie Herman MRN: [id number]  DOB: Oct 05, 1975 Age / Sex: 44 y.o., male   PCP: Patient, No Pcp Per         Medical Service: Internal Medicine Teaching Service         Attending Physician: Dr. Inez Catalina, MD    First Contact: Dr. Imogene Burn Pager: [phone]  Second Contact: Dr. Dortha Schwalbe Pager: [phone]       After Hours (After 5p/  First Contact Pager: [phone]  weekends / holidays): Second Contact Pager: [phone]   Chief Complaint: muscle soreness  History of Present Illness:   Willie Herman is a 44yo male with PMH of HTN, previously treated but resolved TIIDM after weight loss, presenting with fatigue and muscle soreness after walking and being outside for two days. He was kicked out of the home he and his ex share two days ago and lost the contacts on his phone. After the point he only had two 16oz bottles of water and a bottle of gatorade. He came to the ER yesterday with similar symptoms and had normal renal function. He was given 1L of LR and discharged.  He continued to walk and symptoms got worse including muscle cramping and nausea. Nausea resolved when given a medication by EMS through the IV. He denies dizziness, shortness of breath, chest pain or fall. He has not urinated since sometime yesterday afternoon. He does endorse diffuse weakness and some tingling in his hands and feet, which is new but denies numbness. His cramping has resolved and his muscles now just feel slightly sore.  He states social work in the ER has gotten in touch with his son, and he will plan to stay with him once he is discharged.   ED Course: Vitals on arrival to the ER were within normal limits. Cr. Was .84, GFR >60, CK was 6,147. He was given a 2L bolus of NS. He still has not urinated.    Social:   Currently unhomed, will stay with his son in [place] after discharge  He smokes 1ppd He denies alcohol or drug use.   Family History:   Father - HF, MI,  died younger than 68 Grandfather - HF, MI, died younger than 32 Uncle - MI, died younger than 90   Meds:  No outpatient medications have been marked as taking for the 10/20/19 encounter [facility] Encounter).     Allergies: Allergies as of 10/20/2019 - Review Complete 10/20/2019  Allergen Reaction Noted  . Pineapple Shortness Of Breath and Rash 04/13/2011   Past Medical History:  Diagnosis Date  . Anxiety   . COPD (chronic obstructive pulmonary disease) (HCC)   . Depression   . diet controlled diabetes    former  . GERD (gastroesophageal reflux disease)   . High cholesterol   . Hypertension   . Suicidal ideation      Review of Systems: A complete ROS was negative except as per HPI.   Physical Exam: Blood pressure 115/74, pulse 54, temperature 97.8 F (36.6 C), temperature source Oral, resp. rate 16, height 6' (1.829 m), weight (!) 93 kg, SpO2 97 %.  Constitution: tearful, sitting up in bed  HENT: [place]/AT Eyes: no icterus or injection Cardio: RRR, no m/r/g, no JVD, no LE edema, strong pedal pulses  Respiratory: bilateral lower lobe dryt inspiratory crackles, otherwise CTA Abdominal: soft, non-distended, NTTP, normal BS MSK:  strength symmetrical, UE & LE musculature NTTP, no areas of swelling or tautness, sensation UE & LE intact bilaterally Neuro: a&o, normal affect Skin: c/d/i   EKG: personally reviewed my interpretation is early R wave progression, NSR  Assessment & Plan by Problem: Active Problems:   Rhabdomyolysis  Willie Herman is a 44yo male with PMH of HTN, previously treated but resolved TIIDM after weight loss, presenting with rhabdomyolysis after walking and being outside for two days.  Rhabdoymyolysis Likely secondary to dehydration, heat exposure and having to walk for a day and a half. No signs of ischemia or compartment syndrome. CK mildly elevated currently. While he has not yet urinated, his renal function and AST are normal and CK is only mildly  elevated after presenting yesterday for symptoms. Goal is Uop of 200 cc/hr.   - trend CK q8h - will trend BMP for now to monitor electrolytes as well, Mg, Phos - bladder scan  - s/p 2L NS, cont LR 200cc/hr  - PT  - UA, UDS - TOC to establish new PCP  Tobacco Use - nicotine patch   Hx of Diabetes Hyperglycemia  - A1C - CM diet   Diet: CM VTE: lovenox IVF: LR 200cc/hr Code: full  Dispo: Admit patient to Observation with expected length of stay less than 2 midnights.  SignedVersie Starks, DO 10/21/2019, 10:43 AM  Pager: [phone]

## 2019-10-21 NOTE — TOC Initial Note (Addendum)
Transition of Care [facility]) - Initial/Assessment Note    Patient Details  Name: Willie Herman MRN: [id number] Date of Birth: Aug 12, 1975  Transition of Care [facility] CM/SW Contact:    Lockie Pares, RN Phone Number: 10/21/2019, 8:40 AM  Clinical Narrative:                 Saw patient and spoke about homeless issues. Lost his place to stay lost his phone. His SO Sandie kicked him out and left him on the side of the road, took his phone.  Does not have his contact information. Attempted to message patients son and daughter in law via facebook, as that is how he normally can get a hold of him. Left messages. He could otentially stay with them or his brother will try to find him and message him also. Selena Batten, son responded. He stated to me he could not take him in as he has 6 people living in his home already. He has had a up and down relationship with his father. He was at work and could not talk at this time. He gave information to the patients brother, Sharia Reeve [phone]. Josh stated they have not always had the best relationship but he has really tried to make it work with patient. He will call into the room.         Patient Goals and CMS Choice Patient states their goals for this hospitalization and ongoing recovery are:: feel better      Expected Discharge Plan and Services                                                Prior Living Arrangements/Services                       Activities of Daily Living      Permission Sought/Granted                  Emotional Assessment              Admission diagnosis:  dehydration There are no problems to display for this patient.  PCP:  Patient, No Pcp Per Pharmacy:   [facility] [address] [place] - [address] [place] [address] Phone: [phone] Fax: [phone]  [facility] [address] [place] - [address] [place] [address]  [place]  [address] Phone: [phone] Fax: [phone]     Social Determinants of Health (SDOH) Interventions    Readmission Risk Interventions No flowsheet data found.

## 2019-10-21 NOTE — ED Provider Notes (Signed)
[facility] EMERGENCY DEPARTMENT Provider Note   CSN: [id number] Arrival date & time: 10/20/19  1840   History Chief Complaint  Patient presents with  . Dehydration    Willie Herman is a 44 y.o. male.  The history is provided by the patient.  He has history of hypertension, hyperlipidemia, COPD comes in because he thinks he is dehydrated.  He states that he was walking around all day yesterday and only had to bottles of water and 1 bottle of Gatorade.  He has had decreased urine output.  He is complaining of aching in his legs from walking all day.  He was having nausea, but received some medication from EMS and is no longer having nausea.  He relates that he is homeless because his wife kicked him out and he does not have a place to stay.  He states he does not know what resources are available in [place] and wants to talk with the Child psychotherapist.  Past Medical History:  Diagnosis Date  . Anxiety   . COPD (chronic obstructive pulmonary disease) (HCC)   . Depression   . diet controlled diabetes    former  . GERD (gastroesophageal reflux disease)   . High cholesterol   . Hypertension   . Suicidal ideation     There are no problems to display for this patient.   Past Surgical History:  Procedure Laterality Date  . ABDOMINAL SURGERY    . APPENDECTOMY    . LEG SURGERY     when 12. Patient reports he was in tractor accident and leg had to be repaired  . MENISCUS REPAIR    . VASCULAR SURGERY         No family history on file.  Social History   Tobacco Use  . Smoking status: Current Every Day Smoker    Packs/day: 1.50    Types: Cigarettes  . Smokeless tobacco: Never Used  Vaping Use  . Vaping Use: Never used  Substance Use Topics  . Alcohol use: Not Currently  . Drug use: No    Home Medications Prior to Admission medications   Medication Sig Start Date End Date Taking? Authorizing Provider  Aspirin-Salicylamide-Caffeine (BC HEADACHE  POWDER PO) Take 1 packet by mouth every 4 (four) hours.    [provider]  meclizine (ANTIVERT) 25 MG tablet Take 1 tablet (25 mg total) by mouth 3 (three) times daily as needed for dizziness. Patient not taking: Reported on 11/01/2016 03/27/16   Donnetta Hutching, MD  predniSONE (DELTASONE) 20 MG tablet Take 2 tablets (40 mg total) by mouth daily with breakfast. For the next four days Patient not taking: Reported on 10/20/2019 11/01/16   Gerhard Munch, MD  citalopram (CELEXA) 20 MG tablet Take 20 mg by mouth daily.  06/04/11  [provider]    Allergies    Pineapple  Review of Systems   Review of Systems  All other systems reviewed and are negative.   Physical Exam Updated Vital Signs BP (!) 136/82 (BP Location: Left Arm)   Pulse 67   Temp 99.1 F (37.3 C) (Oral)   Resp 16   Ht 6' (1.829 m)   Wt (!) 93 kg   SpO2 100%   BMI 27.80 kg/m   Physical Exam Vitals and nursing note reviewed.   44 year old male, resting comfortably and in no acute distress. Vital signs are normal. Oxygen saturation is 100%, which is normal. Head is normocephalic and atraumatic. PERRLA,  EOMI. Oropharynx is clear. Neck is nontender and supple without adenopathy or JVD. Back is nontender and there is no CVA tenderness. Lungs are clear without rales, wheezes, or rhonchi. Chest is nontender. Heart has regular rate and rhythm without murmur. Abdomen is soft, flat, nontender without masses or hepatosplenomegaly and peristalsis is normoactive. Extremities have no cyanosis or edema, full range of motion is present. Skin is warm and dry without rash. Neurologic: Mental status is normal, cranial nerves are intact, there are no motor or sensory deficits.  ED Results / Procedures / Treatments   Labs (all labs ordered are listed, but only abnormal results are displayed) Labs Reviewed  COMPREHENSIVE METABOLIC PANEL - Abnormal; Notable for the following components:      Result Value   Glucose, Bld  124 (*)    Calcium 8.8 (*)    Total Protein 5.8 (*)    AST 66 (*)    All other components within normal limits  CK - Abnormal; Notable for the following components:   Total CK 6,147 (*)    All other components within normal limits  SARS CORONAVIRUS 2 BY RT PCR (HOSPITAL ORDER, PERFORMED IN [facility])  CBC WITH DIFFERENTIAL/PLATELET  URINALYSIS, ROUTINE W REFLEX MICROSCOPIC   Procedures Procedures   Medications Ordered in ED Medications  sodium chloride 0.9 % bolus 1,000 mL (has no administration in time range)  sodium chloride 0.9 % bolus 1,000 mL (1,000 mLs Intravenous New Bag/Given 10/21/19 0540)    ED Course  I have reviewed the triage vital signs and the nursing notes.  Pertinent lab results that were available during my care of the patient were reviewed by me and considered in my medical decision making (see chart for details).  MDM Rules/Calculators/A&P Homelessness.  Patient does not appear clinically dehydrated, but will check labs.  Transitions of care team has been consulted to let patient know what resources are available.  Old records are reviewed, and he had been seen in the emergency department at [facility] yesterday with essentially the same complaints.  Labs show elevated CK of over 6000.  He will be given additional IV fluids.  Will need to admit to make sure no sequelae of rhabdomyolysis occur.  Case is discussed with Dr. Cleaster Corin of Internal Medicine Teaching Service, who agrees to admit the patient.  Final Clinical Impression(s) / ED Diagnoses Final diagnoses:  Non-traumatic rhabdomyolysis  Homeless    Rx / DC Orders ED Discharge Orders    None       Dione Booze, MD 10/21/19 2235

## 2019-10-22 ENCOUNTER — Encounter (HOSPITAL_COMMUNITY): Payer: Self-pay | Admitting: Internal Medicine

## 2019-10-22 LAB — COMPREHENSIVE METABOLIC PANEL
ALT: 19 U/L (ref 0–44)
AST: 52 U/L — ABNORMAL HIGH (ref 15–41)
Albumin: 3.1 g/dL — ABNORMAL LOW (ref 3.5–5.0)
Alkaline Phosphatase: 40 U/L (ref 38–126)
Anion gap: 6 (ref 5–15)
BUN: 8 mg/dL (ref 6–20)
CO2: 25 mmol/L (ref 22–32)
Calcium: 8.2 mg/dL — ABNORMAL LOW (ref 8.9–10.3)
Chloride: 106 mmol/L (ref 98–111)
Creatinine, Ser: 0.82 mg/dL (ref 0.61–1.24)
GFR calc Af Amer: >60 mL/min (ref >60)
GFR calc non Af Amer: >60 mL/min (ref >60)
Glucose, Bld: 118 mg/dL — ABNORMAL HIGH (ref 70–99)
Potassium: 4.1 mmol/L (ref 3.5–5.1)
Sodium: 137 mmol/L (ref 135–145)
Total Bilirubin: 0.7 mg/dL (ref 0.3–1.2)
Total Protein: 4.9 g/dL — ABNORMAL LOW (ref 6.5–8.1)

## 2019-10-22 LAB — CK
Total CK: 4570 U/L — ABNORMAL HIGH (ref 49–397)
Total CK: 4830 U/L — ABNORMAL HIGH (ref 49–397)
Total CK: 4355 U/L — ABNORMAL HIGH (ref 49–397)

## 2019-10-22 LAB — MAGNESIUM: Magnesium: 2 mg/dL (ref 1.7–2.4)

## 2019-10-22 LAB — CBC
HCT: 38 % — ABNORMAL LOW (ref 39.0–52.0)
Hemoglobin: 13.1 g/dL (ref 13.0–17.0)
MCH: 30.8 pg (ref 26.0–34.0)
MCHC: 34.5 g/dL (ref 30.0–36.0)
MCV: 89.2 fL (ref 80.0–100.0)
Platelets: 224 10*3/uL (ref 150–400)
RBC: 4.26 MIL/uL (ref 4.22–5.81)
RDW: 12 % (ref 11.5–15.5)
WBC: 6.5 10*3/uL (ref 4.0–10.5)
nRBC: 0 % (ref 0.0–0.2)

## 2019-10-22 MED ORDER — POLYETHYLENE GLYCOL 3350 17 G PO PACK
17.0000 g | PACK | Freq: Every day | ORAL | Status: DC
  Administered 2019-10-22 – 2019-10-23 (×2): 17 g via ORAL
  Filled 2019-10-22 (×2): qty 1

## 2019-10-22 MED ORDER — PANTOPRAZOLE SODIUM 40 MG PO TBEC
40.0000 mg | DELAYED_RELEASE_TABLET | Freq: Every day | ORAL | Status: DC
  Administered 2019-10-22 – 2019-10-24 (×3): 40 mg via ORAL
  Filled 2019-10-22 (×3): qty 1

## 2019-10-22 MED ORDER — SODIUM CHLORIDE 0.9 % IV BOLUS: 1000.0000 mL | Freq: Once | INTRAVENOUS | Status: DC

## 2019-10-22 MED ORDER — LACTATED RINGERS IV SOLN: INTRAVENOUS | Status: DC

## 2019-10-22 MED ORDER — LACTATED RINGERS IV BOLUS
1000.0000 mL | Freq: Once | INTRAVENOUS | Status: AC
  Administered 2019-10-22: 1000 mL via INTRAVENOUS

## 2019-10-22 NOTE — Hospital Course (Signed)
Rhabdomyolysis Patient presented with muscle cramps and lack of urine output, notes walking outside in the heat with little fluids for previous 2 days since being kicked out of his home. Initial CK of 6147, got bolus and maintenance fluids, improved fo 4500 upon repeat CK level. *** Creatinine and LFTs are benign. No sign of ischemia or compartment syndrome. Good UOP, bladder scans with small volumes. Patient to go to son's home on discharge.    Hx of DM A1c of 6.1 and sugars benign this admission without insulin.

## 2019-10-22 NOTE — Progress Notes (Signed)
   Subjective:   Patient appears in no acute distress. Patient reports that cramping is improved, but still has muscle aches. Has had good urine output while on IV fluids. He also complains of new sharp pain at epigastric region, history of GERD.  Has not seen a PCP in several years. Does not take any medications at home.  Objective:  Vital signs in last 24 hours: Vitals:   10/21/19 1958 10/22/19 0002 10/22/19 0321 10/22/19 0541  BP: 120/79 128/84 110/68 123/79  Pulse: 72   52  Resp: 18 18 18 16   Temp: 98.1 F (36.7 C) 98 F (36.7 C) 98.4 F (36.9 C)   TempSrc: Oral Oral Oral   SpO2: 98%   95%  Weight:   (!) 92.1 kg   Height:        Physical Exam Constitutional: no acute distress Head: atraumatic ENT: external ears normal Eyes: EOMI Cardiovascular: regular rate and rhythm, normal heart sounds Pulmonary: effort normal, mild crackles in R lower lung fields, mild wheezing in R upper lung field Abdominal: flat, nontender, no rebound tenderness, bowel sounds normal Musculoskeletal: bilateral calves are tight, no skin tautness, extremities otherwise with normal tone Skin: warm and dry Neurological: alert, no focal deficit Psychiatric: normal mood and affect  Assessment/Plan: Willie Herman is a 44 y.o. male with hx of HTN, DM which resolved with weight loss, COPD, HLD, GERD presenting with muscle cramping and no urine output, recently kicked out of home and notes walking all day. CK elevated at presentation which is improving with IVF, good urine output.  Active Problems:   Rhabdomyolysis  Rhabdomyolysis Secondary to dehydration, heat exposure, and walking around outside for 2 days. UDS with amphetamines. No signs of ischemia or compartment syndrome. CK 6000 at presentation, improved to 4500 a few hours after fluids then plateaued around that level. Cr and LFTs benign. UOP of 2450cc in past 24 hours, goal of 200cc/hr. -LR increased to 250cc/hr -1L bolus if next CK still not  trending down -q8 CK -daily CMP and Mg -PT -TOC to establish new PCP  Epigastric pain Sharp. Hx of GERD, but no recent medications. -start protonix 40mg  daily  Lung crackles Mild crackles and wheezing. Hx COPD. -incentive spirometer -consider CXR if worsening symptoms  Hx of DM A1c of 6.1 and sugars benign this admission without insulin. -monitor blood sugar  Tobacco use -nicotine patch  HTN -continue amlodipine 2.5mg   Diet: carb modified IVF: LR VTE: Lovenox Prior to Admission Living Arrangement: recently kicked out of home by ex Anticipated Discharge Location: son's home Barriers to Discharge: rhabdomyolysis treatment Dispo: Anticipated discharge in approximately 0-1 day(s).   59, MD 10/22/2019, 6:35 AM Pager: [phone] After 5pm on weekdays and 1pm on weekends: On Call pager [phone]

## 2019-10-22 NOTE — Evaluation (Signed)
Physical Therapy Evaluation Patient Details Name: Willie Herman MRN: [id number] DOB: 07/09/1975 Today's Date: 10/22/2019   History of Present Illness  44yo male with PMH of HTN, previously treated but resolved TIIDM after weight loss, presenting with fatigue and muscle soreness after walking and being outside for two days. Came to ED 10/19/19 given fluids and released. Returned with c/o nausea and muscle cramping 10/20/19 and admitted for observation of Rhabdomylosis.   Clinical Impression  Patient evaluated by Physical Therapy with no further acute PT needs identified. All education has been completed and the patient has no further questions. Pt has no need for any follow-up Physical Therapy or equipment. PT is signing off. Thank you for this referral.      Follow Up Recommendations No PT follow up    Equipment Recommendations  None recommended by PT       Precautions / Restrictions Precautions Precautions: None Restrictions Weight Bearing Restrictions: No      Mobility  Bed Mobility Overal bed mobility: Independent             General bed mobility comments: HoB slightly elevated  Transfers Overall transfer level: Independent               General transfer comment: good power up and self steadying   Ambulation/Gait Ambulation/Gait assistance: Independent Gait Distance (Feet): 450 Feet Assistive device: None Gait Pattern/deviations: WFL(Within Functional Limits) Gait velocity: WFL Gait velocity interpretation: >2.62 ft/sec, indicative of community ambulatory General Gait Details: slightly antalgic due to gastroc and hamstring soreness      Balance                                 Standardized Balance Assessment Standardized Balance Assessment : Dynamic Gait Index (Pt DGI score indicates low risk of fall)   Dynamic Gait Index Level Surface: Normal Change in Gait Speed: Normal Gait with Horizontal Head Turns: Normal Gait with Vertical Head  Turns: Normal Gait and Pivot Turn: Normal Step Over Obstacle: Normal Step Around Obstacles: Normal Steps: Mild Impairment (not tested but likely mild impairment) Total Score: 23       Pertinent Vitals/Pain Pain Assessment: 0-10 Pain Score: 6  Pain Location: gastrocs and hamstrings Pain Descriptors / Indicators: Grimacing;Guarding Pain Intervention(s): Limited activity within patient's tolerance;Monitored during session;Repositioned    Home Living Family/patient expects to be discharged to:: Shelter/Homeless                 Additional Comments: pt hopeful to stay with son, however per SW notes unlikely    Prior Function Level of Independence: Independent                  Extremity/Trunk Assessment   Upper Extremity Assessment Upper Extremity Assessment: Overall WFL for tasks assessed    Lower Extremity Assessment Lower Extremity Assessment: RLE deficits/detail;LLE deficits/detail RLE Deficits / Details: ROM and strength WFL, soreness with MMT of hamstring and gastroc RLE Sensation: WNL RLE Coordination: WNL LLE Deficits / Details: ROM and strength WFL, soreness with MMT of hamstring and gastroc LLE Sensation: WNL LLE Coordination: WNL    Cervical / Trunk Assessment Cervical / Trunk Assessment: Normal  Communication   Communication: No difficulties  Cognition Arousal/Alertness: Awake/alert Behavior During Therapy: WFL for tasks assessed/performed Overall Cognitive Status: Within Functional Limits for tasks assessed  General Comments General comments (skin integrity, edema, etc.): VSS on RA >95%O2,         Assessment/Plan    PT Assessment Patent does not need any further PT services         PT Goals (Current goals can be found in the Care Plan section)  Acute Rehab PT Goals Patient Stated Goal: go to his son's home     AM-PAC PT "6 Clicks" Mobility  Outcome Measure Help needed turning  from your back to your side while in a flat bed without using bedrails?: None Help needed moving from lying on your back to sitting on the side of a flat bed without using bedrails?: None Help needed moving to and from a bed to a chair (including a wheelchair)?: None Help needed standing up from a chair using your arms (e.g., wheelchair or bedside chair)?: None Help needed to walk in hospital room?: None Help needed climbing 3-5 steps with a railing? : None 6 Click Score: 24    End of Session Equipment Utilized During Treatment: Gait belt Activity Tolerance: Patient tolerated treatment well Patient left: in chair;with call bell/phone within reach Nurse Communication: Mobility status;Other (comment) (request to talk to Child psychotherapist ) PT Visit Diagnosis: Pain Pain - Right/Left:  (bilateral ) Pain - part of body:  (gastroc and hamstring)    Time: 2637-8588 PT Time Calculation (min) (ACUTE ONLY): 18 min   Charges:   PT Evaluation $PT Eval Low Complexity: 1 Low          Dearies Meikle B. Beverely Risen PT, DPT Acute Rehabilitation Services Pager [phone] Office [phone]   Elon Alas Fleet 10/22/2019, 10:35 AM

## 2019-10-23 LAB — COMPREHENSIVE METABOLIC PANEL
ALT: 17 U/L (ref 0–44)
AST: 48 U/L — ABNORMAL HIGH (ref 15–41)
Albumin: 3 g/dL — ABNORMAL LOW (ref 3.5–5.0)
Alkaline Phosphatase: 38 U/L (ref 38–126)
Anion gap: 6 (ref 5–15)
BUN: 5 mg/dL — ABNORMAL LOW (ref 6–20)
CO2: 27 mmol/L (ref 22–32)
Calcium: 8.3 mg/dL — ABNORMAL LOW (ref 8.9–10.3)
Chloride: 105 mmol/L (ref 98–111)
Creatinine, Ser: 0.76 mg/dL (ref 0.61–1.24)
GFR calc Af Amer: >60 mL/min (ref >60)
GFR calc non Af Amer: >60 mL/min (ref >60)
Glucose, Bld: 100 mg/dL — ABNORMAL HIGH (ref 70–99)
Potassium: 3.9 mmol/L (ref 3.5–5.1)
Sodium: 138 mmol/L (ref 135–145)
Total Bilirubin: 0.7 mg/dL (ref 0.3–1.2)
Total Protein: 4.7 g/dL — ABNORMAL LOW (ref 6.5–8.1)

## 2019-10-23 LAB — MAGNESIUM: Magnesium: 1.7 mg/dL (ref 1.7–2.4)

## 2019-10-23 LAB — CK
Total CK: 3702 U/L — ABNORMAL HIGH (ref 49–397)
Total CK: 2796 U/L — ABNORMAL HIGH (ref 49–397)
Total CK: 2485 U/L — ABNORMAL HIGH (ref 49–397)

## 2019-10-23 MED ORDER — POLYETHYLENE GLYCOL 3350 17 G PO PACK
17.0000 g | PACK | Freq: Two times a day (BID) | ORAL | Status: DC
  Administered 2019-10-23 – 2019-10-24 (×2): 17 g via ORAL
  Filled 2019-10-23 (×2): qty 1

## 2019-10-23 MED ORDER — SENNOSIDES-DOCUSATE SODIUM 8.6-50 MG PO TABS
1.0000 | ORAL_TABLET | Freq: Two times a day (BID) | ORAL | Status: DC
  Administered 2019-10-23 – 2019-10-24 (×2): 1 via ORAL
  Filled 2019-10-23 (×2): qty 1

## 2019-10-23 MED ORDER — MAGNESIUM SULFATE 2 GM/50ML IV SOLN
2.0000 g | Freq: Once | INTRAVENOUS | Status: AC
  Administered 2019-10-23: 2 g via INTRAVENOUS
  Filled 2019-10-23: qty 50

## 2019-10-23 NOTE — Progress Notes (Signed)
CSW spoke with patient at bedside. CSW offered patient area shelter resources and housing resources. Patient accepted. Patient is going to try and call area shelters to see if there is any bed availability.  CSW will continue to follow.

## 2019-10-23 NOTE — Progress Notes (Signed)
  Date: 10/23/2019  Patient name: Willie Herman  Medical record number: [id number]  Date of birth: [birth year]        I have seen and evaluated this patient and I have discussed the plan of care with the house staff. Please see Dr. Nicky Pugh note for complete details. I concur with his findings and plan.    Inez Catalina, MD 10/23/2019, 10:33 PM

## 2019-10-23 NOTE — Care Management (Signed)
1512 10-23-19 Case Manager scheduled hospital follow up appointment at the [facility]-information placed on the AVS. Onsite pharmacy for medication assistance will be available at the pharmacy with medications ranging from $4.00-$10.00. Case Manager will continue to follow for transition of care needs. Gala Lewandowsky, RN,BSN Case Manager

## 2019-10-23 NOTE — Progress Notes (Addendum)
   Subjective:   Patient evaluated at bedside. Reports continued cramping in his legs, roughly the same as yesterday. Also continued epigastric pain, attributes this to not having a bowel movement in 6 days. Otherwise no complaints.  Objective:  Vital signs in last 24 hours: Vitals:   10/22/19 1523 10/22/19 1951 10/23/19 0007 10/23/19 0426  BP: 120/81 118/83 115/80 116/77  Pulse: 63 60 62 52  Resp: 18 18 15 18   Temp: 98.7 F (37.1 C) 98.7 F (37.1 C) 98 F (36.7 C) 98 F (36.7 C)  TempSrc: Oral Oral Oral Oral  SpO2: 95% 98% 98% 99%  Weight:    90 kg  Height:        Physical Exam Constitutional: no acute distress Head: atraumatic ENT: external ears normal Eyes: EOMI Cardiovascular: regular rate and rhythm, normal heart sounds Pulmonary: effort normal, lungs clear to ascultation blaterally Abdominal: flat, nontender, no rebound tenderness, bowel sounds normal Musculoskeletal: L calf is still tight but improved, R calf soft, no skin tautness, extremities otherwise with normal tone Skin: warm and dry Neurological: alert, no focal deficit Psychiatric: normal mood and affect  Assessment/Plan: Willie Herman is a 44 y.o. male with hx of HTN, DM which resolved with weight loss, COPD, HLD, GERD presenting with muscle cramping and no urine output, recently kicked out of home and notes walking all day. CK elevated at presentation which is improving with IVF, good urine output.  Active Problems:   Rhabdomyolysis  Rhabdomyolysis Secondary to dehydration, heat exposure, and walking around outside for 2 days. UDS with amphetamines. No signs of ischemia or compartment syndrome. CK 6000 at presentation, plateaued around 4500 yesterday, improved from 3700 this morning. Cr and LFTs benign. UOP of 5200 in past 24 hours, reaching goal of 200cc/hr. -LR increased to 300cc/hr -1L bolus yesterday -q8 CK -daily CMP and Mg -PT signed off -TOC to establish new PCP  Hypomagnesemia Mg of 1.7  today -2g mag sulfate   Epigastric pain Sharp. Hx of GERD, but no recent medications. -continue protonix 40mg  daily -start bowel regimen - miralax BID and senna BIDA  Lung crackles - resolved Mild crackles and wheezing on 7/29, improved on exam. Hx COPD. -incentive spirometer -consider CXR if worsening symptoms  Hx of DM A1c of 6.1 and sugars benign this admission without insulin. -monitor blood sugar  Tobacco use -nicotine patch  HTN -continue amlodipine 2.5mg   Diet: carb modified IVF: LR VTE: Lovenox Prior to Admission Living Arrangement: recently kicked out of home by ex Anticipated Discharge Location: son's home Barriers to Discharge: rhabdomyolysis treatment Dispo: Anticipated discharge in approximately 1-2 day(s).   , MD 10/23/2019, 6:37 AM Pager: [phone] After 5pm on weekdays and 1pm on weekends: On Call pager [phone]

## 2019-10-24 DIAGNOSIS — M6282 Rhabdomyolysis: Principal | ICD-10-CM

## 2019-10-24 DIAGNOSIS — K219 Gastro-esophageal reflux disease without esophagitis: Secondary | ICD-10-CM

## 2019-10-24 DIAGNOSIS — Z590 Homelessness: Secondary | ICD-10-CM

## 2019-10-24 DIAGNOSIS — I10 Essential (primary) hypertension: Secondary | ICD-10-CM

## 2019-10-24 LAB — COMPREHENSIVE METABOLIC PANEL
ALT: 23 U/L (ref 0–44)
AST: 41 U/L (ref 15–41)
Albumin: 3 g/dL — ABNORMAL LOW (ref 3.5–5.0)
Alkaline Phosphatase: 38 U/L (ref 38–126)
Anion gap: 8 (ref 5–15)
BUN: <5 mg/dL — ABNORMAL LOW (ref 6–20)
CO2: 25 mmol/L (ref 22–32)
Calcium: 8.3 mg/dL — ABNORMAL LOW (ref 8.9–10.3)
Chloride: 105 mmol/L (ref 98–111)
Creatinine, Ser: 0.84 mg/dL (ref 0.61–1.24)
GFR calc Af Amer: >60 mL/min (ref >60)
GFR calc non Af Amer: >60 mL/min (ref >60)
Glucose, Bld: 102 mg/dL — ABNORMAL HIGH (ref 70–99)
Potassium: 3.8 mmol/L (ref 3.5–5.1)
Sodium: 138 mmol/L (ref 135–145)
Total Bilirubin: 0.6 mg/dL (ref 0.3–1.2)
Total Protein: 5.3 g/dL — ABNORMAL LOW (ref 6.5–8.1)

## 2019-10-24 LAB — CK
Total CK: 1779 U/L — ABNORMAL HIGH (ref 49–397)
Total CK: 1490 U/L — ABNORMAL HIGH (ref 49–397)

## 2019-10-24 LAB — MAGNESIUM: Magnesium: 1.8 mg/dL (ref 1.7–2.4)

## 2019-10-24 MED ORDER — AMLODIPINE BESYLATE 2.5 MG PO TABS: 2.5000 mg | ORAL_TABLET | Freq: Every day | ORAL | 0 refills | Status: DC

## 2019-10-24 MED ORDER — SUCRALFATE 1 GM/10ML PO SUSP
1.0000 g | Freq: Three times a day (TID) | ORAL | Status: DC
  Filled 2019-10-24: qty 10

## 2019-10-24 MED ORDER — MAGNESIUM SULFATE 2 GM/50ML IV SOLN
2.0000 g | Freq: Once | INTRAVENOUS | Status: AC
  Administered 2019-10-24: 2 g via INTRAVENOUS
  Filled 2019-10-24: qty 50

## 2019-10-24 MED ORDER — NICOTINE 21 MG/24HR TD PT24: 21.0000 mg | MEDICATED_PATCH | Freq: Every day | TRANSDERMAL | 0 refills | Status: AC

## 2019-10-24 MED ORDER — PANTOPRAZOLE SODIUM 40 MG PO TBEC: 40.0000 mg | DELAYED_RELEASE_TABLET | Freq: Every day | ORAL | 0 refills | Status: DC

## 2019-10-24 MED ORDER — SUCRALFATE 1 GM/10ML PO SUSP: 1.0000 g | Freq: Three times a day (TID) | ORAL | 0 refills | Status: DC

## 2019-10-24 NOTE — Progress Notes (Addendum)
° °  Subjective:   Patient evaluated at bedside. Reports that his leg cramping is improved compared to yesterday. Continued epigastric pain, still no BM. Good appetite. Otherwise states that he feels well.  He states that he will no longer be going to stay with his son or brother. He will make phone calls to homeless shelters today. He is agreeable to discharge this afternoon if his CK continues trending down.  Objective:  Vital signs in last 24 hours: Vitals:   10/23/19 0822 10/23/19 1429 10/23/19 1952 10/24/19 0544  BP: 120/73 (!) 145/88 (!) 143/91 (!) 142/92  Pulse: 66 58 54 52  Resp: 16 16 15 15   Temp: 98.1 F (36.7 C) 98.3 F (36.8 C) 98.5 F (36.9 C) 98.1 F (36.7 C)  TempSrc: Oral Oral Oral Oral  SpO2: 98% 98% 99% 97%  Weight:    (!) 96 kg  Height:        Physical Exam Constitutional: no acute distress Head: atraumatic ENT: external ears normal Eyes: EOMI Cardiovascular: regular rate and rhythm, normal heart sounds Pulmonary: effort normal, minimal crackles in R lower lung fields, otherwise clear to ascultation Abdominal: flat, nontender, no rebound tenderness, bowel sounds normal Musculoskeletal: R calf still mildly tight but improved from prior,  Skin: warm and dry Neurological: alert, no focal deficit Psychiatric: normal mood and affect  Assessment/Plan: Willie Herman is a 44 y.o. male with hx of HTN, DM which resolved with weight loss, COPD, HLD, GERD presenting with muscle cramping and no urine output, recently became homeless and notes walking all day in the heat. CK elevated at presentation which is improving with IVF, great urine output.  Active Problems:   Rhabdomyolysis  Rhabdomyolysis Secondary to dehydration, heat exposure, and walking around outside for 2 days. UDS with amphetamines. No signs of ischemia or compartment syndrome. CK 6000 at presentation, plateaued around 4500, now with steady downtrend, last around 1800. Cr and LFTs benign. UOP of 7200 in  past 24 hours, reaching goal of 200cc/hr. -LR decreased to 200cc/hr -q8 CK -daily CMP and Mg -PT signed off -TOC scheduled f/u with [facility] on 8/19 at 2:10PM -if next CK continues down trend, will be medically stable to discharge this afternoon  Hypomagnesemia Mg of 1.8 today -2g mag sulfate   Epigastric pain Sharp. Hx of GERD, but no recent medications. -continue protonix 40mg  daily -start carafate -continue bowel regimen - miralax BID and senna BIDA  Lung crackles  Mild crackles and wheezing on 7/29, improved on 7/30, minimal crackles noted on 7/31. Hx COPD. -incentive spirometer -consider CXR if worsening symptoms  Hx of DM A1c of 6.1 and sugars benign this admission without insulin. -monitor blood sugar  Tobacco use -nicotine patch  HTN -continue amlodipine 2.5mg   Diet: carb modified IVF: LR VTE: Lovenox Prior to Admission Living Arrangement: recently kicked out of home by ex Anticipated Discharge Location: son's home Barriers to Discharge: rhabdomyolysis treatment Dispo: Anticipated discharge in approximately 0-1 day(s).   8/30, MD 10/24/2019, 7:00 AM Pager: [phone] After 5pm on weekdays and 1pm on weekends: On Call pager [phone]
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
## 2019-10-24 NOTE — Social Work (Signed)
CSW was alerted that patient needed transportation to pharmacy to get meds. CSW received approval to provide taxi voucher from on call supervisor.  CSW  Provided RN with taxi voucher.

## 2019-10-24 NOTE — Discharge Instructions (Signed)
Willie Herman, it has been a pleasure taking care of you. Here are your discharge instructions.   1. START Norvasc 2.58m daily for blood pressure  2. START protonics 490mdaily for acid reflux  3. START Carafate 1gm daily for acid reflux   4. [facility], an appointment has been made for you on 8/19 at 2:10p  Managing Stress, Adult Feeling a certain amount of stress is normal. Stress helps our body and mind get ready to deal with the demands of life. Stress hormones can motivate you to do well at work and meet your responsibilities. However severe or long-lasting (chronic) stress can affect your mental and physical health. Chronic stress puts you at higher risk for anxiety, depression, and other health problems like digestive problems, muscle aches, heart disease, high blood pressure, and stroke. What are the causes? Common causes of stress include:  Demands from work, such as deadlines, feeling overworked, or having long hours.  Pressures at home, such as money issues, disagreements with a spouse, or parenting issues.  Pressures from major life changes, such as divorce, moving, loss of a loved one, or chronic illness. You may be at higher risk for stress-related problems if you do not get enough sleep, are in poor health, do not have emotional support, or have a mental health disorder like anxiety or depression. How to recognize stress Stress can make you:  Have trouble sleeping.  Feel sad, anxious, irritable, or overwhelmed.  Lose your appetite.  Overeat or want to eat unhealthy foods.  Want to use drugs or alcohol. Stress can also cause physical symptoms, such as:  Sore, tense muscles, especially in the shoulders and neck.  Headaches.  Trouble breathing.  A faster heart rate.  Stomach pain, nausea, or vomiting.  Diarrhea or constipation.  Trouble concentrating. Follow these instructions at home: Lifestyle  Identify the source of your stress  and your reaction to it. See a therapist who can help you change your reactions.  When there are stressful events: ? Talk about it with family, friends, or co-workers. ? Try to think realistically about stressful events and not ignore them or overreact. ? Try to find the positives in a stressful situation and not focus on the negatives. ? Cut back on responsibilities at work and home, if possible. Ask for help from friends or family members if you need it.  Find ways to cope with stress, such as: ? Meditation. ? Deep breathing. ? Yoga or tai chi. ? Progressive muscle relaxation. ? Doing art, playing music, or reading. ? Making time for fun activities. ? Spending time with family and friends.  Get support from family, friends, or spiritual resources. Eating and drinking  Eat a healthy diet. This includes: ? Eating foods that are high in fiber, such as beans, whole grains, and fresh fruits and vegetables. ? Limiting foods that are high in fat and processed sugars, such as fried and sweet foods.  Do not skip meals or overeat.  Drink enough fluid to keep your urine pale yellow. Alcohol use  Do not drink alcohol if: ? Your health care provider tells you not to drink. ? You are pregnant, may be pregnant, or are planning to become pregnant.  Drinking alcohol is a way some people try to ease their stress. This can be dangerous, so if you drink alcohol: ? Limit how much you use to:  0-1 drink a day for women.  0-2 drinks a day for men. ?  Be aware of how much alcohol is in your drink. In the [place] one drink equals one 12 oz bottle of beer (355 mL), one 5 oz glass of wine (148 mL), or one 1 oz glass of hard liquor (44 mL). Activity   Include 30 minutes of exercise in your daily schedule. Exercise is a good stress reducer.  Include time in your day for an activity that you find relaxing. Try taking a walk, going on a bike ride, reading a book, or listening to music.  Schedule your  time in a way that lowers stress, and keep a consistent schedule. Prioritize what is most important to get done. General instructions  Get enough sleep. Try to go to sleep and get up at about the same time every day.  Take over-the-counter and prescription medicines only as told by your health care provider.  Do not use any products that contain nicotine or tobacco, such as cigarettes, e-cigarettes, and chewing tobacco. If you need help quitting, ask your health care provider.  Do not use drugs or smoke to cope with stress.  Keep all follow-up visits as told by your health care provider. This is important. Where to find support  Talk with your health care provider about stress management or finding a support group.  Find a therapist to work with you on your stress management techniques. Contact a health care provider if:  Your stress symptoms get worse.  You are unable to manage your stress at home.  You are struggling to stop using drugs or alcohol. Get help right away if:  You may be a danger to yourself or others.  You have any thoughts of death or suicide. If you ever feel like you may hurt yourself or others, or have thoughts about taking your own life, get help right away. You can go to your nearest emergency department or call:  Your local emergency services (911 in the [place]  A suicide crisis helpline, such as the [place] at (541)153-1005. This is open 24 hours a day. Summary  Feeling a certain amount of stress is normal, but severe or long-lasting (chronic) stress can affect your mental and physical health.  Chronic stress can put you at higher risk for anxiety, depression, and other health problems like digestive problems, muscle aches, heart disease, high blood pressure, and stroke.  You may be at higher risk for stress-related problems if you do not get enough sleep, are in poor health, lack emotional support, or have a mental health  disorder like anxiety or depression.  Identify the source of your stress and your reaction to it. Try talking about stressful events with family, friends, or co-workers, finding a coping method, or getting support from spiritual resources.  If you need more help, talk with your health care provider about finding a support group or a mental health therapist. This information is not intended to replace advice given to you by your health care provider. Make sure you discuss any questions you have with your health care provider. Document Revised: 10/08/2018 Document Reviewed: 10/08/2018 Elsevier Patient Education  [place].   Rhabdomyolysis Rhabdomyolysis is a condition that happens when muscle cells break down and release substances into the blood that can damage the kidneys. This condition happens because of damage to the muscles that move bones (skeletal muscle). When the skeletal muscles are damaged, substances inside the muscle cells go into the blood. One of these substances is a protein called myoglobin. Large amounts  of myoglobin can cause kidney damage or kidney failure. Other substances that are released by muscle cells may upset the balance of the minerals (electrolytes) in your blood. This imbalance causes your blood to have too much acid (acidosis). What are the causes? This condition is caused by muscle damage. Muscle damage often happens because of:  Using your muscles too much.  An injury that crushes or squeezes a muscle too tightly.  Using illegal drugs, mainly cocaine.  Alcohol abuse. Other possible causes include:  Prescription medicines, such as those that: ? Lower cholesterol (statins). ? Treat ADHD (attention deficit hyperactivity disorder) or help with weight loss (amphetamines). ? Treat pain (opiates).  Infections.  Muscle diseases that are passed down from parent to child (inherited).  High fever.  Heatstroke.  Not having enough fluids in your  body (dehydration).  Seizures.  Surgery. What increases the risk? This condition is more likely to develop in people who:  Have a family history of muscle disease.  Take part in extreme sports, such as running in marathons.  Have diabetes.  Are older.  Abuse drugs or alcohol. What are the signs or symptoms? Symptoms of this condition vary. Some people have very few symptoms, and other people have many symptoms. The most common symptoms include:  Muscle pain and swelling.  Weak muscles.  Dark urine.  Feeling weak and tired. Other symptoms include:  Nausea and vomiting.  Fever.  Pain in the abdomen.  Pain in the joints. Symptoms of complications from this condition include:  Heart rhythm that is not normal (arrhythmia).  Seizures.  Not urinating enough because of kidney failure.  Very low blood pressure (shock). Signs of shock include dizziness, blurry vision, and clammy skin.  Bleeding that is hard to stop or control. How is this diagnosed? This condition is diagnosed based on your medical history, your symptoms, and a physical exam. Tests may also be done, including:  Blood tests.  Urine tests to check for myoglobin. You may also have other tests to check for causes of muscle damage and to check for complications. How is this treated? Treatment for this condition helps to:  Make sure you have enough fluids in your body.  Lower the acid levels in your blood to reverse acidosis.  Protect your kidneys. Treatment may include:  Fluids and medicines given through an IV tube that is inserted into one of your veins.  Medicines to lower acidosis or to bring back the balance of the minerals in your body.  Hemodialysis. This treatment uses an artificial kidney machine to filter your blood while you recover. You may have this if other treatments are not helping. Follow these instructions at home:   Take over-the-counter and prescription medicines only as  told by your health care provider.  Rest at home until your health care provider says that you can return to your normal activities.  Drink enough fluid to keep your urine clear or pale yellow.  Do not do activities that take a lot of effort (are strenuous). Ask your health care provider what level of exercise is safe for you.  Do not abuse drugs or alcohol. If you are having problems with drug or alcohol use, ask your health care provider for help.  Keep all follow-up visits as told by your health care provider. This is important. Contact a health care provider if:  You start having symptoms of this condition after treatment. Get help right away if:  You have a seizure.  You bleed  easily or cannot control bleeding.  You cannot urinate.  You have chest pain.  You have trouble breathing. This information is not intended to replace advice given to you by your health care provider. Make sure you discuss any questions you have with your health care provider. Document Revised: 02/22/2017 Document Reviewed: 12/23/2015 Elsevier Patient Education  2020 [employer].
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
## 2019-11-12 ENCOUNTER — Inpatient Hospital Stay: Payer: Self-pay | Admitting: Physician Assistant

## 2020-05-06 ENCOUNTER — Other Ambulatory Visit: Payer: Self-pay

## 2020-05-06 ENCOUNTER — Encounter (HOSPITAL_COMMUNITY): Payer: Self-pay | Admitting: Emergency Medicine

## 2020-05-06 ENCOUNTER — Emergency Department (HOSPITAL_COMMUNITY)
Admission: EM | Admit: 2020-05-06 | Discharge: 2020-05-07 | Disposition: A | Discharge status: Other Acute Inpatient Hospital | Payer: Self-pay | Attending: Emergency Medicine | Admitting: Emergency Medicine

## 2020-05-06 DIAGNOSIS — I10 Essential (primary) hypertension: Secondary | ICD-10-CM | POA: Insufficient documentation

## 2020-05-06 DIAGNOSIS — F1520 Other stimulant dependence, uncomplicated: Secondary | ICD-10-CM | POA: Insufficient documentation

## 2020-05-06 DIAGNOSIS — E119 Type 2 diabetes mellitus without complications: Secondary | ICD-10-CM | POA: Insufficient documentation

## 2020-05-06 DIAGNOSIS — Z79899 Other long term (current) drug therapy: Secondary | ICD-10-CM | POA: Insufficient documentation

## 2020-05-06 DIAGNOSIS — R45851 Suicidal ideations: Secondary | ICD-10-CM

## 2020-05-06 DIAGNOSIS — Z20822 Contact with and (suspected) exposure to covid-19: Secondary | ICD-10-CM | POA: Insufficient documentation

## 2020-05-06 DIAGNOSIS — F17210 Nicotine dependence, cigarettes, uncomplicated: Secondary | ICD-10-CM | POA: Insufficient documentation

## 2020-05-06 DIAGNOSIS — F332 Major depressive disorder, recurrent severe without psychotic features: Secondary | ICD-10-CM | POA: Insufficient documentation

## 2020-05-06 DIAGNOSIS — J449 Chronic obstructive pulmonary disease, unspecified: Secondary | ICD-10-CM | POA: Insufficient documentation

## 2020-05-06 LAB — CBC WITH DIFFERENTIAL/PLATELET
Abs Immature Granulocytes: 0.08 10*3/uL — ABNORMAL HIGH (ref 0.00–0.07)
Basophils Absolute: 0 10*3/uL (ref 0.0–0.1)
Basophils Relative: 0 %
Eosinophils Absolute: 0.5 10*3/uL (ref 0.0–0.5)
Eosinophils Relative: 4 %
HCT: 47.3 % (ref 39.0–52.0)
Hemoglobin: 16.3 g/dL (ref 13.0–17.0)
Immature Granulocytes: 1 %
Lymphocytes Relative: 18 %
Lymphs Abs: 2.1 10*3/uL (ref 0.7–4.0)
MCH: 30.1 pg (ref 26.0–34.0)
MCHC: 34.5 g/dL (ref 30.0–36.0)
MCV: 87.3 fL (ref 80.0–100.0)
Monocytes Absolute: 0.7 10*3/uL (ref 0.1–1.0)
Monocytes Relative: 6 %
Neutro Abs: 8.1 10*3/uL — ABNORMAL HIGH (ref 1.7–7.7)
Neutrophils Relative %: 71 %
Platelets: 292 10*3/uL (ref 150–400)
RBC: 5.42 MIL/uL (ref 4.22–5.81)
RDW: 11.9 % (ref 11.5–15.5)
WBC: 11.4 10*3/uL — ABNORMAL HIGH (ref 4.0–10.5)
nRBC: 0 % (ref 0.0–0.2)

## 2020-05-06 LAB — COMPREHENSIVE METABOLIC PANEL
ALT: 30 U/L (ref 0–44)
AST: 20 U/L (ref 15–41)
Albumin: 4.4 g/dL (ref 3.5–5.0)
Alkaline Phosphatase: 60 U/L (ref 38–126)
Anion gap: 9 (ref 5–15)
BUN: 11 mg/dL (ref 6–20)
CO2: 27 mmol/L (ref 22–32)
Calcium: 9.4 mg/dL (ref 8.9–10.3)
Chloride: 100 mmol/L (ref 98–111)
Creatinine, Ser: 0.89 mg/dL (ref 0.61–1.24)
GFR, Estimated: >60 mL/min (ref >60)
Glucose, Bld: 111 mg/dL — ABNORMAL HIGH (ref 70–99)
Potassium: 4.3 mmol/L (ref 3.5–5.1)
Sodium: 136 mmol/L (ref 135–145)
Total Bilirubin: 1.2 mg/dL (ref 0.3–1.2)
Total Protein: 7 g/dL (ref 6.5–8.1)

## 2020-05-06 LAB — ETHANOL: Alcohol, Ethyl (B): <10 mg/dL (ref <10)

## 2020-05-06 LAB — RAPID URINE DRUG SCREEN, HOSP PERFORMED
Amphetamines: POSITIVE — AB
Barbiturates: NOT DETECTED
Benzodiazepines: NOT DETECTED
Cocaine: NOT DETECTED
Opiates: NOT DETECTED
Tetrahydrocannabinol: NOT DETECTED

## 2020-05-06 LAB — RESP PANEL BY RT-PCR (FLU A&B, COVID) ARPGX2
Influenza A by PCR: NEGATIVE
Influenza B by PCR: NEGATIVE
SARS Coronavirus 2 by RT PCR: NEGATIVE

## 2020-05-06 MED ORDER — AMLODIPINE BESYLATE 5 MG PO TABS
2.5000 mg | ORAL_TABLET | Freq: Every day | ORAL | Status: DC
  Administered 2020-05-07: 2.5 mg via ORAL
  Filled 2020-05-06: qty 1

## 2020-05-06 MED ORDER — PANTOPRAZOLE SODIUM 40 MG PO TBEC
40.0000 mg | DELAYED_RELEASE_TABLET | Freq: Every day | ORAL | Status: DC
  Administered 2020-05-07: 40 mg via ORAL
  Filled 2020-05-06: qty 1

## 2020-05-06 MED ORDER — SUCRALFATE 1 GM/10ML PO SUSP
1.0000 g | Freq: Three times a day (TID) | ORAL | Status: DC
  Administered 2020-05-07 (×2): 1 g via ORAL
  Filled 2020-05-06 (×2): qty 10

## 2020-05-06 NOTE — ED Triage Notes (Signed)
[facility] - pt was picked up on the side of the road, c/o depressive episode x 6 months, states he has been suicidal since Tuesday, and a cough.

## 2020-05-06 NOTE — ED Notes (Addendum)
Pt ambulated to restroom, pt is calm and cooperative
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
## 2020-05-06 NOTE — ED Notes (Addendum)
Pt being assessed by TTS at this time  

## 2020-05-07 ENCOUNTER — Other Ambulatory Visit: Payer: Self-pay | Admitting: Psychiatry

## 2020-05-07 ENCOUNTER — Encounter (HOSPITAL_COMMUNITY): Payer: Self-pay | Admitting: Psychiatry

## 2020-05-07 ENCOUNTER — Inpatient Hospital Stay (HOSPITAL_COMMUNITY)
Admission: AD | Admit: 2020-05-07 | Discharge: 2020-05-14 | DRG: 885 | Disposition: A | Discharge status: Home / Self Care | Payer: Federal, State, Local not specified - Other | Source: Intra-hospital | Attending: Psychiatry | Admitting: Psychiatry

## 2020-05-07 DIAGNOSIS — E7800 Pure hypercholesterolemia, unspecified: Secondary | ICD-10-CM | POA: Diagnosis present

## 2020-05-07 DIAGNOSIS — Z9151 Personal history of suicidal behavior: Secondary | ICD-10-CM

## 2020-05-07 DIAGNOSIS — E039 Hypothyroidism, unspecified: Secondary | ICD-10-CM | POA: Diagnosis present

## 2020-05-07 DIAGNOSIS — Z560 Unemployment, unspecified: Secondary | ICD-10-CM | POA: Diagnosis not present

## 2020-05-07 DIAGNOSIS — Z818 Family history of other mental and behavioral disorders: Secondary | ICD-10-CM

## 2020-05-07 DIAGNOSIS — K219 Gastro-esophageal reflux disease without esophagitis: Secondary | ICD-10-CM | POA: Diagnosis present

## 2020-05-07 DIAGNOSIS — E119 Type 2 diabetes mellitus without complications: Secondary | ICD-10-CM | POA: Diagnosis present

## 2020-05-07 DIAGNOSIS — Z23 Encounter for immunization: Secondary | ICD-10-CM | POA: Diagnosis not present

## 2020-05-07 DIAGNOSIS — G4700 Insomnia, unspecified: Secondary | ICD-10-CM | POA: Diagnosis present

## 2020-05-07 DIAGNOSIS — R45851 Suicidal ideations: Secondary | ICD-10-CM | POA: Diagnosis present

## 2020-05-07 DIAGNOSIS — F17210 Nicotine dependence, cigarettes, uncomplicated: Secondary | ICD-10-CM | POA: Diagnosis present

## 2020-05-07 DIAGNOSIS — Z79899 Other long term (current) drug therapy: Secondary | ICD-10-CM | POA: Diagnosis not present

## 2020-05-07 DIAGNOSIS — J449 Chronic obstructive pulmonary disease, unspecified: Secondary | ICD-10-CM | POA: Diagnosis present

## 2020-05-07 DIAGNOSIS — Z720 Tobacco use: Secondary | ICD-10-CM

## 2020-05-07 DIAGNOSIS — Z638 Other specified problems related to primary support group: Secondary | ICD-10-CM

## 2020-05-07 DIAGNOSIS — F332 Major depressive disorder, recurrent severe without psychotic features: Secondary | ICD-10-CM | POA: Diagnosis present

## 2020-05-07 DIAGNOSIS — F329 Major depressive disorder, single episode, unspecified: Secondary | ICD-10-CM | POA: Diagnosis present

## 2020-05-07 DIAGNOSIS — F1510 Other stimulant abuse, uncomplicated: Secondary | ICD-10-CM

## 2020-05-07 DIAGNOSIS — F419 Anxiety disorder, unspecified: Secondary | ICD-10-CM | POA: Diagnosis present

## 2020-05-07 DIAGNOSIS — I10 Essential (primary) hypertension: Secondary | ICD-10-CM | POA: Diagnosis present

## 2020-05-07 DIAGNOSIS — F1520 Other stimulant dependence, uncomplicated: Secondary | ICD-10-CM | POA: Diagnosis present

## 2020-05-07 MED ORDER — AMLODIPINE BESYLATE 5 MG PO TABS
5.0000 mg | ORAL_TABLET | Freq: Every day | ORAL | Status: DC
  Administered 2020-05-08: 5 mg via ORAL
  Filled 2020-05-07 (×3): qty 1

## 2020-05-07 MED ORDER — SUCRALFATE 1 GM/10ML PO SUSP
1.0000 g | Freq: Three times a day (TID) | ORAL | Status: DC
  Administered 2020-05-07 – 2020-05-14 (×27): 1 g via ORAL
  Filled 2020-05-07 (×38): qty 10

## 2020-05-07 MED ORDER — INFLUENZA VAC SPLIT QUAD 0.5 ML IM SUSY
0.5000 mL | PREFILLED_SYRINGE | INTRAMUSCULAR | Status: AC
  Administered 2020-05-08: 0.5 mL via INTRAMUSCULAR
  Filled 2020-05-07: qty 0.5

## 2020-05-07 MED ORDER — TRAZODONE HCL 50 MG PO TABS
50.0000 mg | ORAL_TABLET | Freq: Every evening | ORAL | Status: DC | PRN
  Administered 2020-05-07 – 2020-05-08 (×2): 50 mg via ORAL
  Filled 2020-05-07 (×2): qty 1

## 2020-05-07 MED ORDER — ACETAMINOPHEN 325 MG PO TABS
650.0000 mg | ORAL_TABLET | Freq: Four times a day (QID) | ORAL | Status: DC | PRN
  Administered 2020-05-07 – 2020-05-13 (×4): 650 mg via ORAL
  Filled 2020-05-07 (×4): qty 2

## 2020-05-07 MED ORDER — ACETAMINOPHEN 325 MG PO TABS
650.0000 mg | ORAL_TABLET | ORAL | Status: DC | PRN
  Administered 2020-05-07: 650 mg via ORAL
  Filled 2020-05-07: qty 2

## 2020-05-07 MED ORDER — PANTOPRAZOLE SODIUM 40 MG PO TBEC
40.0000 mg | DELAYED_RELEASE_TABLET | Freq: Every day | ORAL | Status: DC
  Administered 2020-05-08 – 2020-05-14 (×7): 40 mg via ORAL
  Filled 2020-05-07 (×2): qty 1
  Filled 2020-05-07: qty 7
  Filled 2020-05-07 (×4): qty 1
  Filled 2020-05-07: qty 7
  Filled 2020-05-07 (×3): qty 1

## 2020-05-07 MED ORDER — ENSURE ENLIVE PO LIQD
237.0000 mL | Freq: Two times a day (BID) | ORAL | Status: DC
  Administered 2020-05-08 – 2020-05-10 (×5): 237 mL via ORAL
  Filled 2020-05-07 (×9): qty 237

## 2020-05-07 MED ORDER — HYDROXYZINE HCL 25 MG PO TABS
25.0000 mg | ORAL_TABLET | Freq: Three times a day (TID) | ORAL | Status: DC | PRN
  Administered 2020-05-07 – 2020-05-11 (×5): 25 mg via ORAL
  Filled 2020-05-07 (×3): qty 1
  Filled 2020-05-07: qty 10
  Filled 2020-05-07 (×2): qty 1

## 2020-05-07 MED ORDER — PNEUMOCOCCAL VAC POLYVALENT 25 MCG/0.5ML IJ INJ
0.5000 mL | INJECTION | INTRAMUSCULAR | Status: AC
  Administered 2020-05-08: 0.5 mL via INTRAMUSCULAR
  Filled 2020-05-07: qty 0.5

## 2020-05-07 NOTE — Progress Notes (Signed)
Per Melbourne Abts, PA-C, patient meets criteria for inpatient treatment. There are no available beds at [facility] currently. CSW faxed referrals to the following facilities for review:  [facility]  TTS will continue to seek bed placement.   Trula Slade, MSW, LCSW Clinical Social Worker 05/07/2020 10:31 AM
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
## 2020-05-07 NOTE — Progress Notes (Signed)
Patient is a 45 year old male who presented to [facility] under voluntary admission from [facility] for increased depression and recurrent SI. Pt reported that he had been feeling suicidal, and knew that he needed help, so he attempted to walk to the hospital. After walking for about 5 miles, pt reported that he felt like he was going to pass out from exhaustion, so he called EMS to pick him up. Pt reported that he had made a suicide attempt  approximately 9 years ago in which he ingested 90 Xanax pills, and knew that if he didn't get help now he may try again. Pt reports his current stressors are being unemployed, and not having a stable place to live with his fiance. Pt endorses using crystal meth regularly every weekend ($40 bag/weekend) for over a year. Pt denies alcohol, and other drug use. Pt presented today as very depressed, was calm and cooperative, answered questions logically and coherently throughout admission interview. Pt denies A/VH and does not appear to be responding to internal stimuli. Pt currently endorse passive SI- no plan- and agrees to contact staff before he would act on any harmful thoughts. Pt states, "I want help, that's why I'm here." VS obtained. Skin assessment completed with no abnormalities noted. Belongings searched and secured in locker. Admission paperwork completed and signed. Verbal understanding expressed. Patient oriented to the unit.Q 15 min checks initiated for safety.

## 2020-05-07 NOTE — Progress Notes (Signed)
[facility] Group Notes:  (Nursing/MHT/Case Management/Adjunct)  Date:  05/07/2020  Time: 2030 Type of Therapy:  wrap up group  Participation Level:  Active  Participation Quality:  Appropriate, Attentive, Sharing and Supportive  Affect:  Depressed  Cognitive:  Appropriate  Insight:  Improving  Engagement in Group:  Engaged  Modes of Intervention:  Clarification, Education and Support  Summary of Progress/Problems: Positive thinking and self-care were discussed.   Willie Herman 05/07/2020, 9:13 PM

## 2020-05-07 NOTE — Progress Notes (Signed)
Per Olunatosin Olasunkanmi RN/AC, pt has been accepted to [facility] bed 301-01. Attending provider is Dr. Jola Babinski MD. Patient can arrive at 12:30PM or later. Number for report is [phone]. CSW spoke with Ulanda Edison RN to notify of disposition.   Trula Slade, MSW, LCSW Clinical Social Worker 05/07/2020 11:20 AM

## 2020-05-07 NOTE — Tx Team (Signed)
Initial Treatment Plan 05/07/2020 6:23 PM Willie Herman IDH:[id number]    PATIENT STRESSORS: Financial difficulties Occupational concerns Substance abuse Other: living situation   PATIENT STRENGTHS: Average or above average intelligence Capable of independent living Communication skills Motivation for treatment/growth Work skills   PATIENT IDENTIFIED PROBLEMS:  Suicidal ideation     "my depression"    " I need help with my anger issue"               DISCHARGE CRITERIA:  Adequate post-discharge living arrangements Improved stabilization in mood, thinking, and/or behavior Reduction of life-threatening or endangering symptoms to within safe limits Verbal commitment to aftercare and medication compliance  PRELIMINARY DISCHARGE PLAN: Attend aftercare/continuing care group Outpatient therapy Return to previous living arrangement  PATIENT/FAMILY INVOLVEMENT: This treatment plan has been presented to and reviewed with the patient, Willie Herman,  The patient has been given the opportunity to ask questions and make suggestions.  Shela Nevin, RN 05/07/2020, 6:23 PM

## 2020-05-07 NOTE — ED Notes (Signed)
Called Safe Transport to [facility].

## 2020-05-08 DIAGNOSIS — F332 Major depressive disorder, recurrent severe without psychotic features: Principal | ICD-10-CM

## 2020-05-08 LAB — HEMOGLOBIN A1C
Hgb A1c MFr Bld: 6.5 % — ABNORMAL HIGH (ref 4.8–5.6)
Mean Plasma Glucose: 139.85 mg/dL

## 2020-05-08 LAB — LIPID PANEL
Cholesterol: 206 mg/dL — ABNORMAL HIGH (ref 0–200)
HDL: 33 mg/dL — ABNORMAL LOW (ref >40)
LDL Cholesterol: 155 mg/dL — ABNORMAL HIGH (ref 0–99)
Total CHOL/HDL Ratio: 6.2 RATIO
Triglycerides: 88 mg/dL (ref <150)
VLDL: 18 mg/dL (ref 0–40)

## 2020-05-08 LAB — TSH: TSH: 4.465 u[IU]/mL (ref 0.350–4.500)

## 2020-05-08 MED ORDER — AMLODIPINE BESYLATE 10 MG PO TABS
10.0000 mg | ORAL_TABLET | Freq: Every day | ORAL | Status: DC
  Administered 2020-05-09 – 2020-05-14 (×6): 10 mg via ORAL
  Filled 2020-05-08 (×7): qty 1
  Filled 2020-05-08 (×2): qty 7

## 2020-05-08 MED ORDER — CITALOPRAM HYDROBROMIDE 20 MG PO TABS
20.0000 mg | ORAL_TABLET | Freq: Every day | ORAL | Status: DC
  Administered 2020-05-08 – 2020-05-14 (×7): 20 mg via ORAL
  Filled 2020-05-08 (×3): qty 1
  Filled 2020-05-08: qty 7
  Filled 2020-05-08 (×5): qty 1
  Filled 2020-05-08: qty 7

## 2020-05-08 NOTE — [facility] Group Notes (Signed)
[facility] LCSW Group Therapy Note  05/08/2020    Type of Therapy and Topic:  Group Therapy:  Adding Supports Including Yourself  Participation Level:  Active   Description of Group:   Patients in this group were introduced to the concept that additional supports including self-support are an essential part of recovery.  Patients listed their current healthy and unhealthy supports, and discussed what is the difference between the two.   A song entitled "My Own Hero" was played and a group discussion ensued in which patients stated they could relate to the song and it inspired them to realize they have be willing to help themselves in order to succeed, because other people cannot achieve sobriety or stability for them.  Additional songs were played ("Fight For It" then "I Am Enough") to encourage patients toward self-advocacy and self-support as part of their recovery.  They discussed their reactions to these songs' messages, which were positive and hopeful.  Before group ended, they identified the supports they believe they need to add to their lives to achieve their goals at discharge.   Therapeutic Goals: 1)  explain the difference between healthy and unhealthy supports and discuss what specific supports are currently in patients' lives 2)  demonstrate the importance of being a key part of one's own support system 3)  discuss the need for appropriate boundaries with supports 4)  elicit ideas from patients about supports that need to be added in order to achieve goals   Summary of Patient Progress:   The patient listed current healthy support as his girlfriend whom he said is the only person who has been there for him for the last 8 years and current unhealthy supports as his family that "does not care about me at all."  He listened attentively and appeared very depressed throughout group.  Prior to the end of group, patient expressed that supports he needs to add at discharge include a therapist to help  him build up the ability to trust himself.    Therapeutic Modalities:   Motivational Interviewing Activity  Lynnell Chad
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
## 2020-05-08 NOTE — Progress Notes (Signed)
[facility] Group Notes:  (Nursing/MHT/Case Management/Adjunct)  Date:  05/08/2020  Time: 2030 Type of Therapy:  wrap up group  Participation Level:  Active  Participation Quality:  Appropriate, Attentive, Sharing and Supportive  Affect:  Flat  Cognitive:  Appropriate  Insight:  Improving  Engagement in Group:  Engaged  Modes of Intervention:  Clarification, Education and Support  Summary of Progress/Problems: Positive thinking and positive change were discussed.   Willie Herman 05/08/2020, 9:20 PM

## 2020-05-08 NOTE — Progress Notes (Signed)
   05/08/20 2012  COVID-19 Daily Checkoff  Have you had a fever (temp > 37.80C/100F)  in the past 24 hours?  No  If you have had runny nose, nasal congestion, sneezing in the past 24 hours, has it worsened? No  COVID-19 EXPOSURE  Have you traveled outside the state in the past 14 days? No  Have you been in contact with someone with a confirmed diagnosis of COVID-19 or PUI in the past 14 days without wearing appropriate PPE? No  Have you been living in the same home as a person with confirmed diagnosis of COVID-19 or a PUI (household contact)? No  Have you been diagnosed with COVID-19? No
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
## 2020-05-08 NOTE — Progress Notes (Signed)
Patient has been up in the dayroom watching tv and interacting appropriately with peers. He attended group and participated. He currently denies si/hi and auditory and visual hallucinations. Safety maintained with 15 min checks.
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
## 2020-05-08 NOTE — [facility] Group Notes (Signed)
Adult Psychoeducational Group Not Date:  05/08/2020 Time:  0900-1045 Group Topic/Focus: PROGRESSIVE RELAXATION. A group where deep breathing is taught and tensing and relaxation muscle groups is used. Imagery is used as well.  Pts are asked to imagine 3 pillars that hold them up when they are not able to hold themselves up.  Participation Level:  Active  Participation Quality:  Appropriate  Affect:  Appropriate  Cognitive:  Oriented  Insight: Improving  Engagement in Group:  Engaged  Modes of Intervention:  Activity, Discussion, Education, and Support  Additional Comments:  Pt rates his energy at a 3/10. States his three pillars are his girlfriend, his family and his faith.  Dione Housekeeper

## 2020-05-08 NOTE — Progress Notes (Signed)
Patient presents with a flat affect and depressed mood. He rates depression 8/10, anxiety 5/10, and hopelessness 8/10 (10 being worst). He denies SI/HI. He reports difficulty sleeping last night and reported having racing thoughts. His stated goal "stop being depressed."  Orders reviewed. Vital signs reviewed. Verbal support provided. 15 minute checks performed for safety.   Patient compliant with treatment plan.

## 2020-05-08 NOTE — [facility] Group Notes (Signed)
Psychoeducational Group Note  Date: 05-08-20 Time:  1300  Group Topic/Focus:  Making Healthy Choices:   The focus of this group is to help patients identify negative/unhealthy choices they were using prior to admission and identify positive/healthier coping strategies to replace them upon discharge.In this group, patients started asking about the brain and how the brain works with and how the chemicals work for those who use substances, the pros and cons of saboxone.  Participation Level:  Active  Participation Quality:  Appropriate  Affect:  Appropriate  Cognitive:  Oriented  Insight:  Improving  Engagement in Group:  Engaged  Additional Comments: Pt participated fully in the group and shared his issues with the group  Vira Blanco A

## 2020-05-09 DIAGNOSIS — F1510 Other stimulant abuse, uncomplicated: Secondary | ICD-10-CM

## 2020-05-09 LAB — T4, FREE: Free T4: 0.78 ng/dL (ref 0.61–1.12)

## 2020-05-09 MED ORDER — TRAZODONE HCL 100 MG PO TABS
100.0000 mg | ORAL_TABLET | Freq: Every evening | ORAL | Status: DC | PRN
  Administered 2020-05-09 – 2020-05-13 (×5): 100 mg via ORAL
  Filled 2020-05-09 (×2): qty 1
  Filled 2020-05-09: qty 7
  Filled 2020-05-09 (×2): qty 1

## 2020-05-09 MED ORDER — BUPROPION HCL ER (XL) 150 MG PO TB24
150.0000 mg | ORAL_TABLET | Freq: Every day | ORAL | Status: DC
  Administered 2020-05-09 – 2020-05-14 (×6): 150 mg via ORAL
  Filled 2020-05-09 (×2): qty 7
  Filled 2020-05-09 (×7): qty 1
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
## 2020-05-09 NOTE — BHH Group Notes (Signed)
Occupational Therapy Group Note Date: 05/09/2020 Group Topic/Focus: Brain Fitness  Group Description: Group encouraged increased social engagement and participation through discussion/activity focused on brain fitness. Patients were provided education on various brain fitness activities/strategies, with explanation provided on the qualifying factors including: one, that is has to be challenging/hard and two, it has to be something that you do not do every day. Patients engaged actively during group session in various brain fitness activities to increase attention, concentration, and problem-solving skills. Discussion followed with a focus on identifying the benefits of brain fitness activities as use for adaptive coping strategies and distraction.    Therapeutic Goal(s): Identify benefit(s) of brain fitness activities as use for adaptive coping and healthy distraction. Identify specific brain fitness activities to engage in as use for adaptive coping and healthy distraction.  Participation Level: Patient did not attend OT group session despite personal invitation and remained laying in bed.   Plan: Continue to engage patient in OT groups 2 - 3x/week.  05/09/2020  Bryden Darden, MOT, OTR/L  

## 2020-05-09 NOTE — Tx Team (Signed)
Interdisciplinary Treatment and Diagnostic Plan Update  05/09/2020 Time of Session: 10:05am Willie Herman MRN: [id number]  Principal Diagnosis: MDD (major depressive disorder)  Secondary Diagnoses: Principal Problem:   MDD (major depressive disorder)   Current Medications:  Current Facility-Administered Medications  Medication Dose Route Frequency Provider Last Rate Last Admin  . acetaminophen (TYLENOL) tablet 650 mg  650 mg Oral Q6H PRN Sharma Covert, MD   650 mg at 05/08/20 2012  . amLODipine (NORVASC) tablet 10 mg  10 mg Oral Daily Sharma Covert, MD   10 mg at 05/09/20 4801  . citalopram (CELEXA) tablet 20 mg  20 mg Oral Daily Sharma Covert, MD   20 mg at 05/09/20 6553  . feeding supplement (ENSURE ENLIVE / ENSURE PLUS) liquid 237 mL  237 mL Oral BID BM Sharma Covert, MD   237 mL at 05/08/20 2011  . hydrOXYzine (ATARAX/VISTARIL) tablet 25 mg  25 mg Oral TID PRN Sharma Covert, MD   25 mg at 05/08/20 2140  . pantoprazole (PROTONIX) EC tablet 40 mg  40 mg Oral Daily Sharma Covert, MD   40 mg at 05/09/20 7482  . sucralfate (CARAFATE) 1 GM/10ML suspension 1 g  1 g Oral TID WC & HS Sharma Covert, MD   1 g at 05/09/20 0729  . traZODone (DESYREL) tablet 50 mg  50 mg Oral QHS PRN Sharma Covert, MD   50 mg at 05/08/20 2140   PTA Medications: Medications Prior to Admission  Medication Sig Dispense Refill Last Dose  . amLODipine (NORVASC) 5 MG tablet Take 5 mg by mouth daily.     . pantoprazole (PROTONIX) 40 MG tablet Take 1 tablet (40 mg total) by mouth daily. 30 tablet 0   . sucralfate (CARAFATE) 1 g tablet Take 1 tablet by mouth 4 (four) times daily -  before meals and at bedtime.       Patient Stressors: Financial difficulties Occupational concerns Substance abuse Other: living situation  Patient Strengths: Average or above average intelligence Capable of independent living Agricultural engineer for treatment/growth Work  skills  Treatment Modalities: Medication Management, Group therapy, Case management,  1 to 1 session with clinician, Psychoeducation, Recreational therapy.   Physician Treatment Plan for Primary Diagnosis: MDD (major depressive disorder) Long Term Goal(s): Improvement in symptoms so as ready for discharge Improvement in symptoms so as ready for discharge   Short Term Goals: Ability to identify changes in lifestyle to reduce recurrence of condition will improve Ability to verbalize feelings will improve Ability to disclose and discuss suicidal ideas Ability to demonstrate self-control will improve Ability to identify and develop effective coping behaviors will improve Compliance with prescribed medications will improve Ability to identify triggers associated with substance abuse/mental health issues will improve  Medication Management: Evaluate patient's response, side effects, and tolerance of medication regimen.  Therapeutic Interventions: 1 to 1 sessions, Unit Group sessions and Medication administration.  Evaluation of Outcomes: Not Met  Physician Treatment Plan for Secondary Diagnosis: Principal Problem:   MDD (major depressive disorder)  Long Term Goal(s): Improvement in symptoms so as ready for discharge Improvement in symptoms so as ready for discharge   Short Term Goals: Ability to identify changes in lifestyle to reduce recurrence of condition will improve Ability to verbalize feelings will improve Ability to disclose and discuss suicidal ideas Ability to demonstrate self-control will improve Ability to identify and develop effective coping behaviors will improve Compliance with prescribed medications will improve Ability to  identify triggers associated with substance abuse/mental health issues will improve     Medication Management: Evaluate patient's response, side effects, and tolerance of medication regimen.  Therapeutic Interventions: 1 to 1 sessions, Unit Group  sessions and Medication administration.  Evaluation of Outcomes: Not Met   RN Treatment Plan for Primary Diagnosis: MDD (major depressive disorder) Long Term Goal(s): Knowledge of disease and therapeutic regimen to maintain health will improve  Short Term Goals: Ability to remain free from injury will improve, Ability to verbalize frustration and anger appropriately will improve, Ability to identify and develop effective coping behaviors will improve and Compliance with prescribed medications will improve  Medication Management: RN will administer medications as ordered by provider, will assess and evaluate patient's response and provide education to patient for prescribed medication. RN will report any adverse and/or side effects to prescribing provider.  Therapeutic Interventions: 1 on 1 counseling sessions, Psychoeducation, Medication administration, Evaluate responses to treatment, Monitor vital signs and CBGs as ordered, Perform/monitor CIWA, COWS, AIMS and Fall Risk screenings as ordered, Perform wound care treatments as ordered.  Evaluation of Outcomes: Not Met   LCSW Treatment Plan for Primary Diagnosis: MDD (major depressive disorder) Long Term Goal(s): Safe transition to appropriate next level of care at discharge, Engage patient in therapeutic group addressing interpersonal concerns.  Short Term Goals: Engage patient in aftercare planning with referrals and resources, Increase social support, Identify triggers associated with mental health/substance abuse issues and Increase skills for wellness and recovery  Therapeutic Interventions: Assess for all discharge needs, 1 to 1 time with Social worker, Explore available resources and support systems, Assess for adequacy in community support network, Educate family and significant other(s) on suicide prevention, Complete Psychosocial Assessment, Interpersonal group therapy.  Evaluation of Outcomes: Not Met   Progress in  Treatment: Attending groups: Yes. Participating in groups: Yes. Taking medication as prescribed: Yes. Toleration medication: Yes. Family/Significant other contact made: No, will contact:  girlfriend Patient understands diagnosis: Yes. Discussing patient identified problems/goals with staff: Yes. Medical problems stabilized or resolved: Yes. Denies suicidal/homicidal ideation: Yes. Issues/concerns per patient self-inventory: No.   New problem(s) identified: No, Describe:  none  New Short Term/Long Term Goal(s): detox, medication management for mood stabilization; elimination of SI thoughts; development of comprehensive mental wellness/sobriety plan    Patient Goals:   "Try to get help"  Discharge Plan or Barriers: Patient is open to therapy and medication management at discharge.  Reason for Continuation of Hospitalization: Depression Medication stabilization Suicidal ideation  Estimated Length of Stay: 3-5 days  Attendees: Patient: Baruc Tugwell 05/09/2020   Physician: Myles Lipps, MD 05/09/2020   Nursing:  05/09/2020   RN Care Manager: 05/09/2020   Social Worker: Darletta Moll, LCSW 05/09/2020   Recreational Therapist:  05/09/2020   Other:  05/09/2020   Other:  05/09/2020   Other: 05/09/2020     Scribe for Treatment Team: Vassie Moselle, LCSW 05/09/2020 10:35 AM
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
## 2020-05-09 NOTE — B[facility] Group Notes (Signed)
05/09/2020 2:45pm   Type of Therapy and Topic:  Group Therapy:  Positive Affirmations   Participation Level:  Did Not Attend  Description of Group: This group addressed positive affirmation toward self and others. Patients went around the room and identified two positive things about themselves and two positive things about a peer in the room. Patients reflected on how it felt to share something positive with others, to identify positive things about themselves, and to hear positive things from others. Patients were encouraged to have a daily reflection of positive characteristics or circumstances. Therapeutic Goals 1. Patient will verbalize two of their positive qualities 2. Patient will demonstrate empathy for others by stating two positive qualities about a peer in the group 3. Patient will verbalize their feelings when voicing positive self affirmations and when voicing positive affirmations of others 4. Patients will discuss the potential positive impact on their wellness/recovery of focusing on positive traits of self and others. Summary of Patient Progress:  The patient did not attend the group but did accept the worksheets that were provided.   Therapeutic Modalities Cognitive Behavioral Therapy Motivational Interviewing  Aram Beecham, [place] 05/09/2020 2:03 PM

## 2020-05-09 NOTE — Progress Notes (Signed)
D:  Patient's self inventory sheet, patient has poor sleep, no sleep medicine.  Good appetite, low energy level, poor concentration.  Rated depression and hopeless and anxiety 8.  Denied withdrawals.  SI, contracts for safety, no plan.  Physical problems, pain, headaches.  Worst pain #6 in past 24 hours.  Goal is work on depression and self esteem.  No discharge plans. A:  Medications administered per MD orders.  Emotional support and encouragement given patient. R:  Denied HI.  SI, no plan, contracts for safety.  Denied A/V hallucinations.  Safety maintained with 15 minute checks.

## 2020-05-09 NOTE — Plan of Care (Signed)
Nurse discussed anxiety, depression and coping skills with patient.  

## 2020-05-09 NOTE — Progress Notes (Signed)
Recreation Therapy Notes  Date: 2.14.22 Time: 0930 Location: 300 Hall Group Room  Group Topic: Stress Management   Goal Area(s) Addresses:  Patient will actively participate in stress management techniques presented during session.   Intervention: Stress management techniques  Activity :Guided Imagery.  LRT was to read a script that took patients on a walk through the forest to visually experience the sights, sounds and smells of the forest to relax.  Patients were to listen and follow along as script was read to engage in activity.  Education:  Stress Management, Discharge Planning.   Education Outcome: Acknowledges education  Clinical Observations/Feedback: Patient did not attend group.    Caroll Rancher, LRT/CTRS    Lillia Abed, Carlea Badour A 05/09/2020 11:04 AM

## 2020-05-09 NOTE — Progress Notes (Signed)
D: Patient presents with pleasant affect and is cooperative during assessment. Patient is positive for passive SI but verbally contracts for safety. Patient denies HI. Patient denies AH/VH at this time. A: Provided positive reinforcement and encouragement.  R: Patient cooperative and receptive to efforts. Patient remains safe on the unit.   05/09/20 2107  Psych Admission Type (Psych Patients Only)  Admission Status Voluntary  Psychosocial Assessment  Patient Complaints Anxiety  Eye Contact Brief  Facial Expression Flat  Affect Appropriate to circumstance  Speech Logical/coherent  Interaction Minimal  Motor Activity Other (Comment) (WDL)  Appearance/Hygiene Unremarkable  Behavior Characteristics Cooperative;Appropriate to situation  Mood Depressed  Thought Process  Coherency WDL  Content WDL  Delusions None reported or observed  Perception WDL  Hallucination None reported or observed  Judgment WDL  Confusion None  Danger to Self  Current suicidal ideation? Passive  Self-Injurious Behavior No self-injurious ideation or behavior indicators observed or expressed   Agreement Not to Harm Self Yes  Description of Agreement Verbal contract  Danger to Others  Danger to Others None reported or observed

## 2020-05-10 DIAGNOSIS — Z720 Tobacco use: Secondary | ICD-10-CM

## 2020-05-10 LAB — CBC WITH DIFFERENTIAL/PLATELET
Abs Immature Granulocytes: 0.03 10*3/uL (ref 0.00–0.07)
Basophils Absolute: 0 10*3/uL (ref 0.0–0.1)
Basophils Relative: 0 %
Eosinophils Absolute: 0.3 10*3/uL (ref 0.0–0.5)
Eosinophils Relative: 5 %
HCT: 43.1 % (ref 39.0–52.0)
Hemoglobin: 15.1 g/dL (ref 13.0–17.0)
Immature Granulocytes: 1 %
Lymphocytes Relative: 29 %
Lymphs Abs: 1.8 10*3/uL (ref 0.7–4.0)
MCH: 30.6 pg (ref 26.0–34.0)
MCHC: 35 g/dL (ref 30.0–36.0)
MCV: 87.2 fL (ref 80.0–100.0)
Monocytes Absolute: 0.4 10*3/uL (ref 0.1–1.0)
Monocytes Relative: 7 %
Neutro Abs: 3.6 10*3/uL (ref 1.7–7.7)
Neutrophils Relative %: 58 %
Platelets: 270 10*3/uL (ref 150–400)
RBC: 4.94 MIL/uL (ref 4.22–5.81)
RDW: 11.8 % (ref 11.5–15.5)
WBC: 6.1 10*3/uL (ref 4.0–10.5)
nRBC: 0 % (ref 0.0–0.2)

## 2020-05-10 LAB — T3 UPTAKE: T3 Uptake Ratio: 21 % — ABNORMAL LOW (ref 24–39)

## 2020-05-10 MED ORDER — NICOTINE 21 MG/24HR TD PT24
21.0000 mg | MEDICATED_PATCH | Freq: Every day | TRANSDERMAL | Status: DC
  Administered 2020-05-10 – 2020-05-14 (×5): 21 mg via TRANSDERMAL
  Filled 2020-05-10 (×7): qty 1

## 2020-05-10 NOTE — BHH Counselor (Signed)
CSW provided the patient with a packet of resources that includes information for shelters and housing, food, [employer] cards for [facility], and good rx cards.  CSW will continue to work with the patient on discharge planning.

## 2020-05-10 NOTE — Progress Notes (Signed)
   05/10/20 0629  Vital Signs  Pulse Rate 74  Pulse Rate Source Monitor  BP 116/81  BP Method Automatic  Patient Position (if appropriate) Standing  Oxygen Therapy  SpO2 99 %   D: Patient denies SI/HI/AVH.  Patient denies anxiety but rates depression 10/10. Pt. Isolated in room. A:  Patient took scheduled medicine.  Support and encouragement provided Routine safety checks conducted every 15 minutes. Patient  Informed to notify staff with any concerns.   R: Safety maintained.

## 2020-05-10 NOTE — Progress Notes (Signed)
D: Patient presents with pleasant affect but reports some feelings of sadness. Patient is observed watching the basketball game in the dayroom and is interacting well with peers. Patient denies SI/HI at this time. Patient also denies AH/VH at this time. Patient contracts for safety.  A: Provided positive reinforcement and encouragement.  R: Patient cooperative and receptive to efforts. Patient remains safe on the unit.   05/10/20 2106  Psych Admission Type (Psych Patients Only)  Admission Status Voluntary  Psychosocial Assessment  Patient Complaints Anxiety  Eye Contact Brief  Facial Expression Other (Comment) (appropriate)  Affect Appropriate to circumstance  Speech Logical/coherent  Interaction Assertive  Motor Activity Other (Comment) (WDL)  Appearance/Hygiene Unremarkable  Behavior Characteristics Cooperative;Appropriate to situation  Mood Sad  Thought Process  Coherency WDL  Content WDL  Delusions None reported or observed  Perception WDL  Hallucination None reported or observed  Judgment WDL  Confusion None  Danger to Self  Current suicidal ideation? Denies  Self-Injurious Behavior No self-injurious ideation or behavior indicators observed or expressed   Agreement Not to Harm Self Yes  Description of Agreement verbal contract  Danger to Others  Danger to Others None reported or observed
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
## 2020-05-11 MED ORDER — METFORMIN HCL 500 MG PO TABS
500.0000 mg | ORAL_TABLET | Freq: Every day | ORAL | Status: DC
Start: 2020-05-11 — End: 2020-05-14
  Administered 2020-05-11 – 2020-05-14 (×5): 500 mg via ORAL
  Filled 2020-05-11: qty 1
  Filled 2020-05-11: qty 7
  Filled 2020-05-11: qty 1
  Filled 2020-05-11: qty 7
  Filled 2020-05-11 (×4): qty 1

## 2020-05-11 MED ORDER — LIVING WELL WITH DIABETES BOOK
Freq: Once | Status: AC
  Filled 2020-05-11: qty 1

## 2020-05-11 MED ORDER — ENSURE ENLIVE PO LIQD
237.0000 mL | Freq: Two times a day (BID) | ORAL | Status: DC | PRN
  Administered 2020-05-12 – 2020-05-13 (×2): 237 mL via ORAL
  Filled 2020-05-11 (×3): qty 237

## 2020-05-11 NOTE — Plan of Care (Signed)
Nurse discussed anxiety, depression and coping skills with patient.  

## 2020-05-11 NOTE — Progress Notes (Signed)
[facility] Group Notes:  (Nursing/MHT/Case Management/Adjunct)  Date:  05/11/2020  Time: 2030  Type of Therapy:  wrap up group  Participation Level:  Active  Participation Quality:  Appropriate, Attentive, Sharing and Supportive  Affect:  Flat  Cognitive:  Appropriate  Insight:  Improving  Engagement in Group:  Engaged  Modes of Intervention:  Clarification, Education and Support  Summary of Progress/Problems: Positive thinking and positive change were discussed.   Marcille Buffy 05/11/2020, 8:25 PM

## 2020-05-11 NOTE — Progress Notes (Signed)
D:  Patient's self inventory sheet, patient sleeps good, no sleep medication.  Good appetite, low energy level, poor concentration.  Rated depression and hopeless 10, denied anxiety.  Denied withdrawals.  SI, contracts for safety, no plan.  Physical problems, headaches.  Worst pain #6 in past 24 hours.  Goal is work on depression.  Plans to "think post".  No discharge plans. A:  Medications administered per MD orders.  Emotional support and encouragement given patient. R:  Safety maintained with 15 minute checks.   This morning while talking to this patient, patient denied SI but wrote on self inventory that he did have SI thoughts.  Patient did contract for safety.
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
## 2020-05-11 NOTE — Progress Notes (Signed)
Recreation Therapy Notes  Date:  2.16.22 Time: 0930 Location: [address] Dayroom  Group Topic: Stress Management  Goal Area(s) Addresses:  Patient will identify positive stress management techniques. Patient will identify benefits of using stress management post d/c.  Behavioral Response: Engaged  Intervention: Stress Management  Activity: Forgiveness of Self and Others Meditation.  LRT played a meditation that focused on finding the empathy within themselves to forgive themselves and others for any let downs, hurt or misunderstandings.  Patients were to listen and follow along with the meditation as it played.    Education:  Stress Management, Discharge Planning.   Education Outcome: Acknowledges Education  Clinical Observations/Feedback: Pt attended and participated in group session.    Caroll Rancher, LRT/CTRS    Lillia Abed, Keiaira Donlan A 05/11/2020 10:59 AM

## 2020-05-11 NOTE — Progress Notes (Signed)
D: Patient presents with sad affect but is cooperative and pleasant upon assessment. Patient spent some time in the dayroom watching a movie with peers and reports enjoying that. Patient is positive for passive SI at this time but verbally contracts for safety. Patient denies HI/AH/VH at this time.  A: Provided positive reinforcement and encouragement.  R: Patient cooperative and receptive to efforts. Patient remains safe on the unit.   05/11/20 2145  Psych Admission Type (Psych Patients Only)  Admission Status Voluntary  Psychosocial Assessment  Patient Complaints Sadness;Depression  Eye Contact Brief  Facial Expression Sad  Affect Appropriate to circumstance  Speech Logical/coherent  Interaction Assertive  Motor Activity Other (Comment) (WDL)  Appearance/Hygiene Unremarkable  Behavior Characteristics Cooperative;Appropriate to situation  Thought Process  Coherency WDL  Content WDL  Delusions None reported or observed  Perception WDL  Hallucination None reported or observed  Judgment WDL  Confusion None  Danger to Self  Current suicidal ideation? Passive  Self-Injurious Behavior No self-injurious ideation or behavior indicators observed or expressed   Agreement Not to Harm Self Yes  Description of Agreement Verbal contract  Danger to Others  Danger to Others None reported or observed

## 2020-05-11 NOTE — [facility] Group Notes (Signed)
[facility] LCSW Group Therapy  05/11/2020 2:00 PM  Type of Therapy:  Group Therapy: Reflection  Participation Level:  Active  Summary of Progress/Problems: Pt was given time to self-reflect during a recreation period outside.   Felizardo Hoffmann 05/11/2020, 2:00 PM

## 2020-05-12 LAB — GLUCOSE, CAPILLARY: Glucose-Capillary: 130 mg/dL — ABNORMAL HIGH (ref 70–99)
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
## 2020-05-12 NOTE — Progress Notes (Signed)
The patient rated his day as a 9 out of 10 since he found a place to go following discharge and states that he will return to his home. He anticipates being discharged this coming Saturday. His goal for tomorrow is to work on his depression and self-esteem.

## 2020-05-12 NOTE — Progress Notes (Signed)
   05/12/20 0703  Vital Signs  Temp 97.7 F (36.5 C)  Temp Source Oral  Pulse Rate 61  BP 128/86  BP Location Left Arm  BP Method Automatic  Patient Position (if appropriate) Sitting   D: Patient denies SI/HI/AVH. Patient denies anxiety, but rated depression 7/10. Patient isolated in room.  A:  Patient took scheduled medicine.  Support and encouragement provided Routine safety checks conducted every 15 minutes. Patient  Informed to notify staff with any concerns.   R: Safety maintained.

## 2020-05-12 NOTE — Progress Notes (Signed)
Counseling Intern (CI) checked in on pt per referral from SW  CI asked pt how he was feeling and he said that he was feeling better but nervous about going home  Pt shared with CI about his GF and the "issues" they have had throughout the years  Pt also discussed his hard time having trust in people but knowing that he needs to but still having a hard time grieving a friendship of 25 years  Pt shared that he is still grieving the deaths of 3 major people in his life  He does see progress in himself because he recalls the last time he had SI he had a plan but the past time recently he stopped and reached out for help and came to [facility]  Pt is feeling more hopeful and wants to focus of "just today"   CI can follow up when needed   Leane Para  Counseling Intern @ [facility]

## 2020-05-13 LAB — GLUCOSE, CAPILLARY: Glucose-Capillary: 256 mg/dL — ABNORMAL HIGH (ref 70–99)

## 2020-05-13 NOTE — [facility] Group Notes (Signed)
LCSW Group Therapy Note 05/13/2020 1:15pm  Type of Therapy and Topic:  Group Therapy:  Setting Goals  Participation Level:  Active  Description of Group: In this process group, patients discussed using strengths to work toward goals and address challenges.  Patients identified two positive things about themselves and one goal they were working on.  Patients were given the opportunity to share openly and support each other's plan for self-empowerment.  The group discussed the value of gratitude and were encouraged to have a daily reflection of positive characteristics or circumstances.  Patients were encouraged to identify a plan to utilize their strengths to work on current challenges and goals.  Therapeutic Goals 1. Patient will verbalize personal strengths/positive qualities and relate how these can assist with achieving desired personal goals 2. Patients will verbalize affirmation of peers plans for personal change and goal setting 3. Patients will explore the value of gratitude and positive focus as related to successful achievement of goals 4. Patients will verbalize a plan for regular reinforcement of personal positive qualities and circumstances.     Therapeutic Modalities Cognitive Behavioral Therapy Motivational Interviewing    Willie Herman 05/13/2020 2:12 PM

## 2020-05-13 NOTE — Progress Notes (Signed)
   05/13/20 0007  Psych Admission Type (Psych Patients Only)  Admission Status Voluntary  Psychosocial Assessment  Patient Complaints Anxiety  Eye Contact Brief  Facial Expression Sad  Affect Appropriate to circumstance  Speech Logical/coherent  Interaction Assertive  Motor Activity Other (Comment) (WDL)  Appearance/Hygiene Unremarkable  Behavior Characteristics Cooperative;Calm  Mood Pleasant  Thought Process  Coherency WDL  Content WDL  Delusions None reported or observed  Perception WDL  Hallucination None reported or observed  Judgment WDL  Confusion None  Danger to Self  Current suicidal ideation? Denies  Self-Injurious Behavior No self-injurious ideation or behavior indicators observed or expressed   Agreement Not to Harm Self Yes  Description of Agreement Verbal contract  Danger to Others  Danger to Others None reported or observed
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
## 2020-05-13 NOTE — Progress Notes (Addendum)
   05/13/20 0629  Vital Signs  Pulse Rate 65  Pulse Rate Source Dinamap  BP 124/80  BP Method Automatic  Patient Position (if appropriate) Standing   D: Patient denies SI/HI/AVH. Patient denies anxiety and depression. Pt. Isolates in room.  A:  Patient took scheduled medicine.  Support and encouragement provided Routine safety checks conducted every 15 minutes. Patient  Informed to notify staff with any concerns.   R: Safety maintained.  Late Note: Diabetic teaching including 2 handouts from the [employer] about 4 steps to controlling diabetes and Ways to Prevent diabetes.

## 2020-05-13 NOTE — Progress Notes (Signed)
Recreation Therapy Notes  Date:  2.18.22 Time: 0930 Location: 300 Hall Group Room  Group Topic: Stress Management  Goal Area(s) Addresses:  Patient will identify positive stress management techniques. Patient will identify benefits of using stress management post d/c.  Behavioral Response: Engaged  Intervention: Stress Management  Activity: Meditation.  LRT played a meditation on mindful walking. Patients were to listen and envision themselves walking along the beach enjoying sights, sounds and the feeling of what it would be like to feel the sun and water on their skin.  Patients were to listen and follow along as meditation played to engage in activity.    Education:  Stress Management, Discharge Planning.   Education Outcome: Acknowledges Education  Clinical Observations/Feedback: Pt attended and participated in group.    Caroll Rancher, LRT/CTRS    Caroll Rancher A 05/13/2020 11:17 AM

## 2020-05-13 NOTE — Progress Notes (Signed)
Inpatient Diabetes Program Recommendations  AACE/ADA: New Consensus Statement on Inpatient Glycemic Control (2015)  Target Ranges:  Prepandial:   less than 140 mg/dL      Peak postprandial:   less than 180 mg/dL (1-2 hours)      Critically ill patients:  140 - 180 mg/dL   Lab Results  Component Value Date   GLUCAP 256 (H) 05/13/2020   HGBA1C 6.5 (H) 05/08/2020    Review of Glycemic Control Results for Willie Herman, Willie Herman (MRN [id number]) as of 05/13/2020 11:14  Ref. Range 05/12/2020 06:11 05/13/2020 08:15  Glucose-Capillary Latest Ref Range: 70 - 99 mg/dL 409 (H) 811 (H)   Diabetes history:  New DM2 Current orders for Inpatient glycemic control:  Metformin 500 mg daily  Referral for A1C of 6.5%.  Ordered Living Well with DM booklet on 2/16.  Will need close follow up with PCP.  TOC consult also placed on 2/16 for no PCP.  Please use each patient interaction to provide diabetes education. Please review Living Well with Diabetes booklet with the patient, have patient watch patient education videos on diabetes. Please allow patient to be actively engaged with diabetes management by allowing patient to check own glucose.   Will continue to follow while inpatient.  Thank you, Dulce Sellar, RN, BSN Diabetes Coordinator Inpatient Diabetes Program [phone] (team pager from 8a-5p)

## 2020-05-13 NOTE — Progress Notes (Signed)
   05/13/20 2200  Psych Admission Type (Psych Patients Only)  Admission Status Voluntary  Psychosocial Assessment  Patient Complaints Anxiety  Eye Contact Brief  Facial Expression Sad  Affect Appropriate to circumstance  Speech Logical/coherent  Interaction Assertive  Motor Activity Other (Comment) (WDL)  Appearance/Hygiene Unremarkable  Behavior Characteristics Cooperative  Mood Pleasant  Thought Process  Coherency WDL  Content WDL  Delusions None reported or observed  Perception WDL  Hallucination None reported or observed  Judgment WDL  Confusion None  Danger to Self  Current suicidal ideation? Denies  Self-Injurious Behavior No self-injurious ideation or behavior indicators observed or expressed   Agreement Not to Harm Self Yes  Description of Agreement Verbal contract  Danger to Others  Danger to Others None reported or observed  D: Patient out in the hallway interacting with peers. Pt reports he had a good day. Pt reports he is discharging tomorrow and will be following up with outpatient provider.   A: Medications administered as prescribed. Support and encouragement provided as needed.  R: Patient remains safe on the unit. Will continue to monitor for safety and stability.

## 2020-05-13 NOTE — Tx Team (Signed)
Interdisciplinary Treatment and Diagnostic Plan Update  05/13/2020 Time of Session: 10:05am Willie Herman MRN: [id number]  Principal Diagnosis: MDD (major depressive disorder)  Secondary Diagnoses: Principal Problem:   MDD (major depressive disorder) Active Problems:   Stimulant abuse (HCC)   Nicotine abuse   Current Medications:  Current Facility-Administered Medications  Medication Dose Route Frequency Provider Last Rate Last Admin  . acetaminophen (TYLENOL) tablet 650 mg  650 mg Oral Q6H PRN Antonieta Pert, MD   650 mg at 05/11/20 0753  . amLODipine (NORVASC) tablet 10 mg  10 mg Oral Daily Antonieta Pert, MD   10 mg at 05/13/20 0813  . buPROPion (WELLBUTRIN XL) 24 hr tablet 150 mg  150 mg Oral Daily Antonieta Pert, MD   150 mg at 05/13/20 0813  . citalopram (CELEXA) tablet 20 mg  20 mg Oral Daily Antonieta Pert, MD   20 mg at 05/13/20 0813  . feeding supplement (ENSURE ENLIVE / ENSURE PLUS) liquid 237 mL  237 mL Oral BID BM & HS PRN Bartholomew Crews E, MD   237 mL at 05/12/20 2159  . hydrOXYzine (ATARAX/VISTARIL) tablet 25 mg  25 mg Oral TID PRN Antonieta Pert, MD   25 mg at 05/11/20 2145  . metFORMIN (GLUCOPHAGE) tablet 500 mg  500 mg Oral Q breakfast Mason Jim, Amy E, MD   500 mg at 05/13/20 0814  . nicotine (NICODERM CQ - dosed in mg/24 hours) patch 21 mg  21 mg Transdermal Daily Comer Locket, MD   21 mg at 05/13/20 0815  . pantoprazole (PROTONIX) EC tablet 40 mg  40 mg Oral Daily Antonieta Pert, MD   40 mg at 05/13/20 0813  . sucralfate (CARAFATE) 1 GM/10ML suspension 1 g  1 g Oral TID WC & HS Antonieta Pert, MD   1 g at 05/13/20 0815  . traZODone (DESYREL) tablet 100 mg  100 mg Oral QHS PRN Antonieta Pert, MD   100 mg at 05/12/20 2057   PTA Medications: Medications Prior to Admission  Medication Sig Dispense Refill Last Dose  . amLODipine (NORVASC) 5 MG tablet Take 5 mg by mouth daily.     . pantoprazole (PROTONIX) 40 MG tablet Take 1 tablet  (40 mg total) by mouth daily. 30 tablet 0   . sucralfate (CARAFATE) 1 g tablet Take 1 tablet by mouth 4 (four) times daily -  before meals and at bedtime.       Patient Stressors: Financial difficulties Occupational concerns Substance abuse Other: living situation  Patient Strengths: Average or above average intelligence Capable of independent living Barrister's clerk for treatment/growth Work skills  Treatment Modalities: Medication Management, Group therapy, Case management,  1 to 1 session with clinician, Psychoeducation, Recreational therapy.   Physician Treatment Plan for Primary Diagnosis: MDD (major depressive disorder) Long Term Goal(s): Improvement in symptoms so as ready for discharge Improvement in symptoms so as ready for discharge   Short Term Goals: Ability to identify changes in lifestyle to reduce recurrence of condition will improve Ability to identify and develop effective coping behaviors will improve Ability to identify triggers associated with substance abuse/mental health issues will improve  Medication Management: Evaluate patient's response, side effects, and tolerance of medication regimen.  Therapeutic Interventions: 1 to 1 sessions, Unit Group sessions and Medication administration.  Evaluation of Outcomes: Progressing  Physician Treatment Plan for Secondary Diagnosis: Principal Problem:   MDD (major depressive disorder) Active Problems:   Stimulant abuse (HCC)  Nicotine abuse  Long Term Goal(s): Improvement in symptoms so as ready for discharge Improvement in symptoms so as ready for discharge   Short Term Goals: Ability to identify changes in lifestyle to reduce recurrence of condition will improve Ability to identify and develop effective coping behaviors will improve Ability to identify triggers associated with substance abuse/mental health issues will improve     Medication Management: Evaluate patient's response, side  effects, and tolerance of medication regimen.  Therapeutic Interventions: 1 to 1 sessions, Unit Group sessions and Medication administration.  Evaluation of Outcomes: Progressing   RN Treatment Plan for Primary Diagnosis: MDD (major depressive disorder) Long Term Goal(s): Knowledge of disease and therapeutic regimen to maintain health will improve  Short Term Goals: Ability to remain free from injury will improve, Ability to verbalize frustration and anger appropriately will improve, Ability to identify and develop effective coping behaviors will improve and Compliance with prescribed medications will improve  Medication Management: RN will administer medications as ordered by provider, will assess and evaluate patient's response and provide education to patient for prescribed medication. RN will report any adverse and/or side effects to prescribing provider.  Therapeutic Interventions: 1 on 1 counseling sessions, Psychoeducation, Medication administration, Evaluate responses to treatment, Monitor vital signs and CBGs as ordered, Perform/monitor CIWA, COWS, AIMS and Fall Risk screenings as ordered, Perform wound care treatments as ordered.  Evaluation of Outcomes: Progressing   LCSW Treatment Plan for Primary Diagnosis: MDD (major depressive disorder) Long Term Goal(s): Safe transition to appropriate next level of care at discharge, Engage patient in therapeutic group addressing interpersonal concerns.  Short Term Goals: Engage patient in aftercare planning with referrals and resources, Increase social support, Identify triggers associated with mental health/substance abuse issues and Increase skills for wellness and recovery  Therapeutic Interventions: Assess for all discharge needs, 1 to 1 time with Social worker, Explore available resources and support systems, Assess for adequacy in community support network, Educate family and significant other(s) on suicide prevention, Complete  Psychosocial Assessment, Interpersonal group therapy.  Evaluation of Outcomes: Progressing   Progress in Treatment: Attending groups: Yes. Participating in groups: Yes. Taking medication as prescribed: Yes. Toleration medication: Yes. Family/Significant other contact made: Yes, individual(s) contacted:  Girlfriend Patient understands diagnosis: Yes. Discussing patient identified problems/goals with staff: Yes. Medical problems stabilized or resolved: Yes. Denies suicidal/homicidal ideation: Yes. Issues/concerns per patient self-inventory: No.   New problem(s) identified: No, Describe:  none  New Short Term/Long Term Goal(s): detox, medication management for mood stabilization; elimination of SI thoughts; development of comprehensive mental wellness/sobriety plan    Patient Goals:   "Try to get help"  Discharge Plan or Barriers: Patient will return home with his girlfriend and will follow up with [facility] in [place].   Reason for Continuation of Hospitalization: Depression Medication stabilization Suicidal ideation  Estimated Length of Stay: 3-5 days  Attendees: Patient: Willie Herman 05/13/2020   Physician: Bartholomew Crews, MD 05/13/2020   Nursing:  05/13/2020   RN Care Manager: 05/13/2020   Social Worker: Melba Coon, LCSWA 05/13/2020   Recreational Therapist:  05/13/2020   Other:  05/13/2020   Other:  05/13/2020   Other: 05/13/2020     Scribe for Treatment Team: Aram Beecham, LCSWA 05/13/2020 9:11 AM

## 2020-05-14 LAB — GLUCOSE, CAPILLARY: Glucose-Capillary: 125 mg/dL — ABNORMAL HIGH (ref 70–99)

## 2020-05-14 MED ORDER — BUPROPION HCL ER (XL) 150 MG PO TB24: 150.0000 mg | ORAL_TABLET | Freq: Every day | ORAL | 0 refills | Status: DC

## 2020-05-14 MED ORDER — METFORMIN HCL 500 MG PO TABS: 500.0000 mg | ORAL_TABLET | Freq: Every day | ORAL | 0 refills | Status: DC

## 2020-05-14 MED ORDER — TRAZODONE HCL 100 MG PO TABS: 100.0000 mg | ORAL_TABLET | Freq: Every evening | ORAL | 0 refills | Status: DC | PRN

## 2020-05-14 MED ORDER — BLOOD GLUCOSE MONITOR KIT
PACK | 0 refills | Status: DC
Start: 2020-05-14 — End: 2020-09-21

## 2020-05-14 MED ORDER — AMLODIPINE BESYLATE 10 MG PO TABS
10.0000 mg | ORAL_TABLET | Freq: Every day | ORAL | 0 refills | Status: DC
Start: 2020-05-15 — End: 2020-08-22

## 2020-05-14 MED ORDER — PANTOPRAZOLE SODIUM 40 MG PO TBEC: 40.0000 mg | DELAYED_RELEASE_TABLET | Freq: Every day | ORAL | 0 refills | Status: DC

## 2020-05-14 MED ORDER — SUCRALFATE 1 GM/10ML PO SUSP: 1.0000 g | Freq: Three times a day (TID) | ORAL | 0 refills | Status: DC

## 2020-05-14 MED ORDER — HYDROXYZINE HCL 25 MG PO TABS: 25.0000 mg | ORAL_TABLET | Freq: Three times a day (TID) | ORAL | 0 refills | Status: DC | PRN

## 2020-05-14 MED ORDER — CITALOPRAM HYDROBROMIDE 20 MG PO TABS: 20.0000 mg | ORAL_TABLET | Freq: Every day | ORAL | 0 refills | Status: DC

## 2020-05-14 NOTE — Progress Notes (Signed)
Patient denies SI/HI. Patient received both written and verbal discharge instructions. Patient verbalized understanding of discharge instructions. Patient agreed to f/u and med regimen. Patient received an AVS, SRA, transitional record, sample medications and prescriptions. All belongings returned to the pt from the locker. Patient safely discharged to the lobby and transported home by [place].
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
## 2020-05-14 NOTE — [facility] Group Notes (Signed)
Psychoeducational Group Note  Date: 05-14-2020 Time: 0900-1000    Goal Setting   Purpose of Group: This group helps to provide patients with the steps of setting a goal that is specific, measurable, attainable, realistic and time specific. A discussion on how we keep ourselves stuck with negative self talk.    Participation Level:  Did not attend   Willie Herman A  

## 2020-05-14 NOTE — Progress Notes (Signed)
  [facility] Adult Case Management Discharge Plan :  Will you be returning to the same living situation after discharge:  Yes,  with girlfriend At discharge, do you have transportation home?: Yes,  CSW arranged Benedetto Goad Do you have the ability to pay for your medications: No.  Will need help from community organization.  Release of information consent forms completed and emailed to Medical Records, then turned in to Medical Records by CSW.   Patient to Follow up at:  Follow-up Information    [facility]. Go on 05/17/2020.   Why: You have a hospital follow up appointment on 05/17/20 at 9:00 am for therapy and medication management services.  This appointment will be held in person.   Contact information: [address] [place] [address] [phone]        [facility]. Go on 05/25/2020.   Why: You have a hospital follow up appointment to establish care for primary care services on 05/25/20 at 11:10 am.  This appointment will be held in person.  Please arrive 15 minutes prior to your appointment. PLEASE INQUIRE ABOUT FINANCIAL ASSISTANCE AT APPT. Contact information: [place] [address] [phone]       [facility] Follow up.   Why: You may go to this provider for pharmacy services. Contact information: [address] [place] [address] [phone]              Next level of care provider has access to [facility] Link:no  Safety Planning and Suicide Prevention discussed: Yes,  with girlfriend     Has patient been referred to the Quitline?: Patient refused referral  Patient has been referred for addiction treatment: Yes  Lynnell Chad, LCSW 05/14/2020, 12:01 PM

## 2020-05-16 ENCOUNTER — Emergency Department (HOSPITAL_COMMUNITY): Admission: EM | Admit: 2020-05-16 | Discharge: 2020-05-16 | Discharge status: Against Medical Advice | Payer: Self-pay

## 2020-05-16 NOTE — ED Triage Notes (Signed)
Pt called for triage, no answer. Registration called triage RN to desk to report that pt had already left before being seen.

## 2020-05-16 NOTE — ED Triage Notes (Signed)
Pt brought in by [facility] for feeling "loopy" while walking down the street since 0900 this morning. Pt felt like his sugar was low, CBG for EMS 150. Pt also reported palpitations. HR 103, BP 128/72, O2 96% on RA for EMS.

## 2020-05-17 ENCOUNTER — Inpatient Hospital Stay (HOSPITAL_COMMUNITY)
Admission: EM | Admit: 2020-05-17 | Discharge: 2020-05-19 | DRG: 918 | Disposition: A | Discharge status: Other Acute Inpatient Hospital | Payer: Self-pay | Attending: Internal Medicine | Admitting: Internal Medicine

## 2020-05-17 ENCOUNTER — Encounter (HOSPITAL_COMMUNITY): Payer: Self-pay

## 2020-05-17 ENCOUNTER — Other Ambulatory Visit: Payer: Self-pay

## 2020-05-17 DIAGNOSIS — Z79899 Other long term (current) drug therapy: Secondary | ICD-10-CM

## 2020-05-17 DIAGNOSIS — F17210 Nicotine dependence, cigarettes, uncomplicated: Secondary | ICD-10-CM | POA: Diagnosis present

## 2020-05-17 DIAGNOSIS — R45851 Suicidal ideations: Secondary | ICD-10-CM

## 2020-05-17 DIAGNOSIS — Z20822 Contact with and (suspected) exposure to covid-19: Secondary | ICD-10-CM | POA: Diagnosis present

## 2020-05-17 DIAGNOSIS — E785 Hyperlipidemia, unspecified: Secondary | ICD-10-CM | POA: Diagnosis present

## 2020-05-17 DIAGNOSIS — M6282 Rhabdomyolysis: Secondary | ICD-10-CM | POA: Diagnosis present

## 2020-05-17 DIAGNOSIS — I10 Essential (primary) hypertension: Secondary | ICD-10-CM | POA: Diagnosis present

## 2020-05-17 DIAGNOSIS — F419 Anxiety disorder, unspecified: Secondary | ICD-10-CM | POA: Diagnosis present

## 2020-05-17 DIAGNOSIS — F1510 Other stimulant abuse, uncomplicated: Secondary | ICD-10-CM | POA: Diagnosis present

## 2020-05-17 DIAGNOSIS — Z720 Tobacco use: Secondary | ICD-10-CM | POA: Diagnosis present

## 2020-05-17 DIAGNOSIS — D72829 Elevated white blood cell count, unspecified: Secondary | ICD-10-CM | POA: Diagnosis present

## 2020-05-17 DIAGNOSIS — E861 Hypovolemia: Secondary | ICD-10-CM | POA: Diagnosis present

## 2020-05-17 DIAGNOSIS — Z9151 Personal history of suicidal behavior: Secondary | ICD-10-CM

## 2020-05-17 DIAGNOSIS — Z8249 Family history of ischemic heart disease and other diseases of the circulatory system: Secondary | ICD-10-CM

## 2020-05-17 DIAGNOSIS — K5900 Constipation, unspecified: Secondary | ICD-10-CM | POA: Diagnosis present

## 2020-05-17 DIAGNOSIS — E871 Hypo-osmolality and hyponatremia: Secondary | ICD-10-CM | POA: Diagnosis present

## 2020-05-17 DIAGNOSIS — Z7984 Long term (current) use of oral hypoglycemic drugs: Secondary | ICD-10-CM

## 2020-05-17 DIAGNOSIS — T50902A Poisoning by unspecified drugs, medicaments and biological substances, intentional self-harm, initial encounter: Principal | ICD-10-CM | POA: Diagnosis present

## 2020-05-17 DIAGNOSIS — Z7982 Long term (current) use of aspirin: Secondary | ICD-10-CM

## 2020-05-17 DIAGNOSIS — F329 Major depressive disorder, single episode, unspecified: Secondary | ICD-10-CM | POA: Diagnosis present

## 2020-05-17 DIAGNOSIS — E876 Hypokalemia: Secondary | ICD-10-CM | POA: Diagnosis present

## 2020-05-17 DIAGNOSIS — J449 Chronic obstructive pulmonary disease, unspecified: Secondary | ICD-10-CM | POA: Diagnosis present

## 2020-05-17 DIAGNOSIS — R7989 Other specified abnormal findings of blood chemistry: Secondary | ICD-10-CM

## 2020-05-17 DIAGNOSIS — E669 Obesity, unspecified: Secondary | ICD-10-CM | POA: Diagnosis present

## 2020-05-17 DIAGNOSIS — Z91018 Allergy to other foods: Secondary | ICD-10-CM

## 2020-05-17 DIAGNOSIS — K219 Gastro-esophageal reflux disease without esophagitis: Secondary | ICD-10-CM | POA: Diagnosis present

## 2020-05-17 DIAGNOSIS — Z6830 Body mass index (BMI) 30.0-30.9, adult: Secondary | ICD-10-CM

## 2020-05-17 DIAGNOSIS — E119 Type 2 diabetes mellitus without complications: Secondary | ICD-10-CM | POA: Diagnosis present

## 2020-05-17 LAB — COMPREHENSIVE METABOLIC PANEL
ALT: 44 U/L (ref 0–44)
AST: 138 U/L — ABNORMAL HIGH (ref 15–41)
Albumin: 4.2 g/dL (ref 3.5–5.0)
Alkaline Phosphatase: 49 U/L (ref 38–126)
Anion gap: 10 (ref 5–15)
BUN: 33 mg/dL — ABNORMAL HIGH (ref 6–20)
CO2: 22 mmol/L (ref 22–32)
Calcium: 8.3 mg/dL — ABNORMAL LOW (ref 8.9–10.3)
Chloride: 96 mmol/L — ABNORMAL LOW (ref 98–111)
Creatinine, Ser: 0.91 mg/dL (ref 0.61–1.24)
GFR, Estimated: >60 mL/min (ref >60)
Glucose, Bld: 96 mg/dL (ref 70–99)
Potassium: 3.2 mmol/L — ABNORMAL LOW (ref 3.5–5.1)
Sodium: 128 mmol/L — ABNORMAL LOW (ref 135–145)
Total Bilirubin: 2.2 mg/dL — ABNORMAL HIGH (ref 0.3–1.2)
Total Protein: 6.8 g/dL (ref 6.5–8.1)

## 2020-05-17 LAB — CBC WITH DIFFERENTIAL/PLATELET
Abs Immature Granulocytes: 0.05 10*3/uL (ref 0.00–0.07)
Basophils Absolute: 0 10*3/uL (ref 0.0–0.1)
Basophils Relative: 0 %
Eosinophils Absolute: 0.2 10*3/uL (ref 0.0–0.5)
Eosinophils Relative: 2 %
HCT: 37.8 % — ABNORMAL LOW (ref 39.0–52.0)
Hemoglobin: 13.3 g/dL (ref 13.0–17.0)
Immature Granulocytes: 0 %
Lymphocytes Relative: 21 %
Lymphs Abs: 2.6 10*3/uL (ref 0.7–4.0)
MCH: 30.3 pg (ref 26.0–34.0)
MCHC: 35.2 g/dL (ref 30.0–36.0)
MCV: 86.1 fL (ref 80.0–100.0)
Monocytes Absolute: 1.2 10*3/uL — ABNORMAL HIGH (ref 0.1–1.0)
Monocytes Relative: 10 %
Neutro Abs: 8.2 10*3/uL — ABNORMAL HIGH (ref 1.7–7.7)
Neutrophils Relative %: 67 %
Platelets: 248 10*3/uL (ref 150–400)
RBC: 4.39 MIL/uL (ref 4.22–5.81)
RDW: 12 % (ref 11.5–15.5)
WBC: 12.4 10*3/uL — ABNORMAL HIGH (ref 4.0–10.5)
nRBC: 0 % (ref 0.0–0.2)

## 2020-05-17 LAB — RESP PANEL BY RT-PCR (FLU A&B, COVID) ARPGX2
Influenza A by PCR: NEGATIVE
Influenza B by PCR: NEGATIVE
SARS Coronavirus 2 by RT PCR: NEGATIVE

## 2020-05-17 LAB — PROTIME-INR
INR: 1 (ref 0.8–1.2)
Prothrombin Time: 12.8 seconds (ref 11.4–15.2)

## 2020-05-17 LAB — RAPID URINE DRUG SCREEN, HOSP PERFORMED
Amphetamines: POSITIVE — AB
Barbiturates: NOT DETECTED
Benzodiazepines: NOT DETECTED
Cocaine: NOT DETECTED
Opiates: NOT DETECTED
Tetrahydrocannabinol: NOT DETECTED

## 2020-05-17 LAB — CK: Total CK: 9598 U/L — ABNORMAL HIGH (ref 49–397)

## 2020-05-17 LAB — LACTIC ACID, PLASMA
Lactic Acid, Venous: 1.2 mmol/L (ref 0.5–1.9)
Lactic Acid, Venous: 2 mmol/L (ref 0.5–1.9)

## 2020-05-17 LAB — ACETAMINOPHEN LEVEL: Acetaminophen (Tylenol), Serum: <10 ug/mL — ABNORMAL LOW (ref 10–30)

## 2020-05-17 LAB — SALICYLATE LEVEL: Salicylate Lvl: <7 mg/dL — ABNORMAL LOW (ref 7.0–30.0)

## 2020-05-17 LAB — MAGNESIUM: Magnesium: 1.9 mg/dL (ref 1.7–2.4)

## 2020-05-17 LAB — ETHANOL: Alcohol, Ethyl (B): <10 mg/dL (ref <10)

## 2020-05-17 MED ORDER — ACETAMINOPHEN 650 MG RE SUPP: 650.0000 mg | Freq: Four times a day (QID) | RECTAL | Status: DC | PRN

## 2020-05-17 MED ORDER — AMLODIPINE BESYLATE 5 MG PO TABS
10.0000 mg | ORAL_TABLET | Freq: Every day | ORAL | Status: DC
  Administered 2020-05-18 – 2020-05-19 (×2): 10 mg via ORAL
  Filled 2020-05-17 (×2): qty 2

## 2020-05-17 MED ORDER — MORPHINE SULFATE (PF) 2 MG/ML IV SOLN
2.0000 mg | INTRAVENOUS | Status: DC | PRN
  Administered 2020-05-18: 2 mg via INTRAVENOUS
  Filled 2020-05-17: qty 1

## 2020-05-17 MED ORDER — POTASSIUM CHLORIDE 10 MEQ/100ML IV SOLN
10.0000 meq | Freq: Once | INTRAVENOUS | Status: AC
  Administered 2020-05-17: 10 meq via INTRAVENOUS
  Filled 2020-05-17: qty 100

## 2020-05-17 MED ORDER — CHARCOAL ACTIVATED PO LIQD
50.0000 g | Freq: Once | ORAL | Status: AC
  Administered 2020-05-17: 50 g via ORAL
  Filled 2020-05-17: qty 240

## 2020-05-17 MED ORDER — SODIUM CHLORIDE 0.9 % IV SOLN: INTRAVENOUS | Status: DC

## 2020-05-17 MED ORDER — PANTOPRAZOLE SODIUM 40 MG PO TBEC
40.0000 mg | DELAYED_RELEASE_TABLET | Freq: Every day | ORAL | Status: DC
  Administered 2020-05-18 – 2020-05-19 (×2): 40 mg via ORAL
  Filled 2020-05-17 (×2): qty 1

## 2020-05-17 MED ORDER — ONDANSETRON HCL 4 MG/2ML IJ SOLN
4.0000 mg | Freq: Four times a day (QID) | INTRAMUSCULAR | Status: DC | PRN
  Administered 2020-05-18: 4 mg via INTRAVENOUS
  Filled 2020-05-17: qty 2

## 2020-05-17 MED ORDER — HEPARIN SODIUM (PORCINE) 5000 UNIT/ML IJ SOLN
5000.0000 [IU] | Freq: Three times a day (TID) | INTRAMUSCULAR | Status: DC
  Administered 2020-05-18 – 2020-05-19 (×5): 5000 [IU] via SUBCUTANEOUS
  Filled 2020-05-17 (×5): qty 1

## 2020-05-17 MED ORDER — SODIUM CHLORIDE 0.9 % IV BOLUS
1000.0000 mL | Freq: Once | INTRAVENOUS | Status: AC
  Administered 2020-05-17: 1000 mL via INTRAVENOUS

## 2020-05-17 MED ORDER — SUCRALFATE 1 GM/10ML PO SUSP
1.0000 g | Freq: Three times a day (TID) | ORAL | Status: DC
  Administered 2020-05-18 – 2020-05-19 (×7): 1 g via ORAL
  Filled 2020-05-17 (×6): qty 10

## 2020-05-17 MED ORDER — ACETAMINOPHEN 325 MG PO TABS: 650.0000 mg | ORAL_TABLET | Freq: Four times a day (QID) | ORAL | Status: DC | PRN

## 2020-05-17 MED ORDER — ONDANSETRON HCL 4 MG PO TABS: 4.0000 mg | ORAL_TABLET | Freq: Four times a day (QID) | ORAL | Status: DC | PRN

## 2020-05-17 NOTE — ED Notes (Addendum)
Spoke with Shanda Bumps from [employer] and updated her on patient status.
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
## 2020-05-17 NOTE — ED Notes (Signed)
Pt Dressed out. Security called to wand pt. 3 bags of belongings with pt sticker placed in psych locker room on second shelf from bottom.

## 2020-05-17 NOTE — ED Provider Notes (Signed)
[facility] EMERGENCY DEPARTMENT Provider Note   CSN: [id number] Arrival date & time: 05/17/20  1809     History Chief Complaint  Patient presents with  . Suicidal  . Suicide Attempt    Willie Herman is a 45 y.o. male with PMHx HTN, COPD, GERD, anxiety, depression who presents to the ED today via EMS with complaint of suicidal ideation/suicide attempt. Pt reports that at some time yesterday (unable to specify time) he took a 5-6 day supply of all the medications he was discharged home with during his previous [facility] stay on 2/19 in an attempt to self harm as well as snorting meth. Pt states that he woke up this morning by a tree and began walking toward the hospital when EMS picked him up. He is currently complaining of diffuse body aches and states he feels like he has been "beat up." Per chart review pt was brought to the ED yesterday by EMS for feeling "loopy" while walking down the street since 9 AM in the morning. Pt felt like his sugar was low however 150 with EMS. He was also reporting palpitations at that time. It appears pt LWBS. He does not remember being in the ED at all yesterday. He is unsure if he took the medications before or after coming to the ED.   Per chart review: Pt discharged home with 10 mg Amlodipine once daily, 150 mg wellbutrin XL once daily, celexa 20 mg once daily, and 500 mg metformin once daily on 2/19 from [facility]. His other medications include hydroxyzine 25 mg TID PRN for anxiety, protonix 40 mg once daily, carafate, and 100 mg trazodone qhs PRN for sleep however pt does not believe he took those medications as well.    The history is provided by the patient, the EMS personnel and medical records.       Past Medical History:  Diagnosis Date  . Anxiety   . COPD (chronic obstructive pulmonary disease) ([place])   . Depression   . diet controlled diabetes    former  . GERD (gastroesophageal reflux disease)   . High cholesterol   . Hypertension   . Suicidal  ideation     Patient Active Problem List   Diagnosis Date Noted  . Nicotine abuse 05/10/2020  . Stimulant abuse ([place]) 05/09/2020  . MDD (major depressive disorder) 05/07/2020  . Rhabdomyolysis 10/21/2019    Past Surgical History:  Procedure Laterality Date  . ABDOMINAL SURGERY    . APPENDECTOMY    . LEG SURGERY     when 12. Patient reports he was in tractor accident and leg had to be repaired  . MENISCUS REPAIR    . VASCULAR SURGERY         History reviewed. No pertinent family history.  Social History   Tobacco Use  . Smoking status: Current Every Day Smoker    Packs/day: 1.50    Types: Cigarettes  . Smokeless tobacco: Never Used  Vaping Use  . Vaping Use: Never used  Substance Use Topics  . Alcohol use: Not Currently  . Drug use: Yes    Types: Methamphetamines    Comment: crystal meth x 1 week ago    Home Medications Prior to Admission medications   Medication Sig Start Date End Date Taking? Authorizing Provider  amLODipine (NORVASC) 10 MG tablet Take 1 tablet (10 mg total) by mouth daily. 05/15/20  Yes Ethelene Hal, NP  Aspirin-Caffeine [phone] MG PACK Take 1 packet by mouth daily.  Yes [provider]  buPROPion (WELLBUTRIN XL) 150 MG 24 hr tablet Take 1 tablet (150 mg total) by mouth daily. 05/15/20  Yes Ethelene Hal, NP  citalopram (CELEXA) 20 MG tablet Take 1 tablet (20 mg total) by mouth daily. 05/15/20  Yes Ethelene Hal, NP  hydrOXYzine (ATARAX/VISTARIL) 25 MG tablet Take 1 tablet (25 mg total) by mouth 3 (three) times daily as needed for itching, anxiety or nausea. 05/14/20  Yes Ethelene Hal, NP  metFORMIN (GLUCOPHAGE) 500 MG tablet Take 1 tablet (500 mg total) by mouth daily with breakfast. 05/15/20  Yes Ethelene Hal, NP  pantoprazole (PROTONIX) 40 MG tablet Take 1 tablet (40 mg total) by mouth daily. 05/14/20 06/13/20 Yes Ethelene Hal, NP  sucralfate (CARAFATE) 1 GM/10ML suspension Take 10 mLs (1 g  total) by mouth 4 (four) times daily -  with meals and at bedtime. 05/14/20  Yes Ethelene Hal, NP  traZODone (DESYREL) 100 MG tablet Take 1 tablet (100 mg total) by mouth at bedtime as needed for sleep. 05/14/20  Yes Ethelene Hal, NP  blood glucose meter kit and supplies KIT Dispense based on patient and insurance preference. Use up to four times daily as directed. (FOR ICD-9 250.00, 250.01). 05/14/20   Ethelene Hal, NP    Allergies    Pineapple  Review of Systems   Review of Systems  Constitutional: Negative for chills and fever.  Respiratory: Negative for shortness of breath.   Cardiovascular: Negative for chest pain.  Musculoskeletal: Positive for myalgias.  Neurological: Negative for weakness, numbness and headaches.  All other systems reviewed and are negative.   Physical Exam Updated Vital Signs BP 138/77 (BP Location: Right Arm)   Pulse 79   Temp 98.2 F (36.8 C) (Oral)   Resp (!) 22   Ht '5\' 11"'  (1.803 m)   Wt 100 kg   SpO2 99%   BMI 30.75 kg/m   Physical Exam Vitals and nursing note reviewed.  Constitutional:      Appearance: He is not ill-appearing or diaphoretic.  HENT:     Head: Normocephalic and atraumatic.  Eyes:     Conjunctiva/sclera: Conjunctivae normal.  Cardiovascular:     Rate and Rhythm: Normal rate and regular rhythm.     Pulses: Normal pulses.  Pulmonary:     Effort: Pulmonary effort is normal.     Breath sounds: Normal breath sounds. No wheezing, rhonchi or rales.  Abdominal:     Palpations: Abdomen is soft.     Tenderness: There is no abdominal tenderness. There is no guarding or rebound.  Musculoskeletal:     Cervical back: Neck supple.  Skin:    General: Skin is warm and dry.  Neurological:     General: No focal deficit present.     Mental Status: He is alert and oriented to person, place, and time.     ED Results / Procedures / Treatments   Labs (all labs ordered are listed, but only abnormal results are  displayed) Labs Reviewed  COMPREHENSIVE METABOLIC PANEL - Abnormal; Notable for the following components:      Result Value   Sodium 128 (*)    Potassium 3.2 (*)    Chloride 96 (*)    BUN 33 (*)    Calcium 8.3 (*)    AST 138 (*)    Total Bilirubin 2.2 (*)    All other components within normal limits  CBC WITH DIFFERENTIAL/PLATELET - Abnormal; Notable for the  following components:   WBC 12.4 (*)    HCT 37.8 (*)    Neutro Abs 8.2 (*)    Monocytes Absolute 1.2 (*)    All other components within normal limits  ACETAMINOPHEN LEVEL - Abnormal; Notable for the following components:   Acetaminophen (Tylenol), Serum <10 (*)    All other components within normal limits  SALICYLATE LEVEL - Abnormal; Notable for the following components:   Salicylate Lvl <1.5 (*)    All other components within normal limits  CK - Abnormal; Notable for the following components:   Total CK 9,598 (*)    All other components within normal limits  RESP PANEL BY RT-PCR (FLU A&B, COVID) ARPGX2  ETHANOL  MAGNESIUM  LACTIC ACID, PLASMA  RAPID URINE DRUG SCREEN, HOSP PERFORMED  LACTIC ACID, PLASMA  PROTIME-INR    EKG None  Radiology No results found.  Procedures Procedures   Medications Ordered in ED Medications  potassium chloride 10 mEq in 100 mL IVPB (10 mEq Intravenous New Bag/Given 05/17/20 2034)  charcoal activated (NO SORBITOL) (ACTIDOSE-AQUA) suspension 50 g (50 g Oral Given 05/17/20 1910)  sodium chloride 0.9 % bolus 1,000 mL (1,000 mLs Intravenous New Bag/Given 05/17/20 2038)    ED Course  I have reviewed the triage vital signs and the nursing notes.  Pertinent labs & imaging results that were available during my care of the patient were reviewed by me and considered in my medical decision making (see chart for details).    MDM Rules/Calculators/A&P                          45 year old male presents to the ED via EMS with suicidal ideation/suicide attempt by taking multiple medications  sometime between today and yesterday.  Reports he took 5 to 6-day supply of multiple medications seen above.  Currently complaining of body aches diffusely.  Vital signs are stable.  Patient is afebrile, nontachycardic and nontachypneic and appears to be in no acute distress.  He is alert and oriented x3.  He denies any chest pain, abdominal pain, physical exam reassuring at this time.   Discussed case with poison control. They recommend patient be monitored for 24 hours after wellbutrin ingestion; given pt is unable to give a specific time of when he took these medications and has lost time without remembering coming to the ED yesterday they recommend starting at time of arrival today to be conservative.  They recommend EKG at this time and if normal can be repeated every 6-8 hours; if QRS > 120 or QTC > 500 call back and pt may need more frequent repeat EKGs. They also recommend adding on a magnesium incase pt has prolonged qtc as well as CK level given pt lost time to rule out rhabdo. Have placed these orders in addition to medical clearance labs CBC, CMP, acetaminophen, salicylate, UDS which poison control agrees with. Pt will need to be admitted for observation once initial labs return so that he can be properly monitored for 24 hours. Discussed case with attending physician Dr. Roderic Palau who agrees with plan.   CBC with mild leukocytosis 12,000 CMP with sodium 128, potassium 3.2, chloride 96, BUN 33 however creatinine stable at 0.91. Will provide fluids and potassium at this time. Magnesium normal at 1.9. AST elevated at 138 and t bili 2.2; no other LFT elevations. Pt denies EtOH use. He has no abdominal TTP at this time. Do not feel pt requires abdominal imaging. PT  INR ordered.  EtOH, acetaminophen, and salicylate levels unremarkable Lactic acid normal at 1.2  CK significantly elevated 9,598 today. Fluids already running. Will call for admission at this time for rhabdo/observation for 24 hours  (cleared: 6:09 PM tomorrow). Pt will need TTS eval once he is medically cleared.   Discussed case with [facility] Dr. Clearence Ped who agrees to evaluate patient for admission.   This note was prepared using Dragon voice recognition software and may include unintentional dictation errors due to the inherent limitations of voice recognition software.  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

## 2020-05-17 NOTE — ED Triage Notes (Signed)
Pt arrives via EMS.  EMS reports that patient was found walking the streets and expressed suicidal ideations.  Reports that patient was registered to be seen last night and left, but patient doesn't remember this occurring.  During triage, pt changed into purple scrubs and all personal belongings secured away from patient.   Pt reports a replace on Meth on Saturday, states "I Labib't remember anything since Saturday."  Pt states waking up yesterday, "I realized what I had done and then I just took all of my medicine."  Pt reports taking a 5-6 day supply of:  Celexa Wellbutrin Anxiety Medication  Metformin  Blood pressure medication.  Pt denies chest pain, dizziness, lightheadedness, shortness of breath or abdominal pain.   ED provider at bedside.

## 2020-05-17 NOTE — ED Notes (Signed)
Date and time results received: 05/17/20 9:39 PM   Test: Lactic Acid Critical Value: 2.0  Name of Provider Notified:   Orders Received? Or Actions Taken?: See Orders

## 2020-05-18 DIAGNOSIS — M6282 Rhabdomyolysis: Secondary | ICD-10-CM

## 2020-05-18 DIAGNOSIS — R45851 Suicidal ideations: Secondary | ICD-10-CM

## 2020-05-18 DIAGNOSIS — E871 Hypo-osmolality and hyponatremia: Secondary | ICD-10-CM | POA: Diagnosis present

## 2020-05-18 DIAGNOSIS — E876 Hypokalemia: Secondary | ICD-10-CM | POA: Diagnosis present

## 2020-05-18 DIAGNOSIS — F1994 Other psychoactive substance use, unspecified with psychoactive substance-induced mood disorder: Secondary | ICD-10-CM | POA: Diagnosis not present

## 2020-05-18 LAB — BASIC METABOLIC PANEL
Anion gap: 5 (ref 5–15)
BUN: 13 mg/dL (ref 6–20)
CO2: 27 mmol/L (ref 22–32)
Calcium: 8.1 mg/dL — ABNORMAL LOW (ref 8.9–10.3)
Chloride: 101 mmol/L (ref 98–111)
Creatinine, Ser: 0.7 mg/dL (ref 0.61–1.24)
GFR, Estimated: >60 mL/min (ref >60)
Glucose, Bld: 137 mg/dL — ABNORMAL HIGH (ref 70–99)
Potassium: 4 mmol/L (ref 3.5–5.1)
Sodium: 133 mmol/L — ABNORMAL LOW (ref 135–145)

## 2020-05-18 LAB — CBC WITH DIFFERENTIAL/PLATELET
Abs Immature Granulocytes: 0.05 10*3/uL (ref 0.00–0.07)
Basophils Absolute: 0 10*3/uL (ref 0.0–0.1)
Basophils Relative: 0 %
Eosinophils Absolute: 0.4 10*3/uL (ref 0.0–0.5)
Eosinophils Relative: 5 %
HCT: 37.5 % — ABNORMAL LOW (ref 39.0–52.0)
Hemoglobin: 13 g/dL (ref 13.0–17.0)
Immature Granulocytes: 1 %
Lymphocytes Relative: 31 %
Lymphs Abs: 2.5 10*3/uL (ref 0.7–4.0)
MCH: 29.9 pg (ref 26.0–34.0)
MCHC: 34.7 g/dL (ref 30.0–36.0)
MCV: 86.2 fL (ref 80.0–100.0)
Monocytes Absolute: 0.8 10*3/uL (ref 0.1–1.0)
Monocytes Relative: 10 %
Neutro Abs: 4.2 10*3/uL (ref 1.7–7.7)
Neutrophils Relative %: 53 %
Platelets: 216 10*3/uL (ref 150–400)
RBC: 4.35 MIL/uL (ref 4.22–5.81)
RDW: 11.9 % (ref 11.5–15.5)
WBC: 7.9 10*3/uL (ref 4.0–10.5)
nRBC: 0 % (ref 0.0–0.2)

## 2020-05-18 LAB — HEPATITIS PANEL, ACUTE
HCV Ab: NONREACTIVE
Hep A IgM: NONREACTIVE
Hep B C IgM: NONREACTIVE
Hepatitis B Surface Ag: NONREACTIVE

## 2020-05-18 LAB — COMPREHENSIVE METABOLIC PANEL
ALT: 36 U/L (ref 0–44)
AST: 95 U/L — ABNORMAL HIGH (ref 15–41)
Albumin: 3.5 g/dL (ref 3.5–5.0)
Alkaline Phosphatase: 42 U/L (ref 38–126)
Anion gap: 9 (ref 5–15)
BUN: 23 mg/dL — ABNORMAL HIGH (ref 6–20)
CO2: 26 mmol/L (ref 22–32)
Calcium: 8.2 mg/dL — ABNORMAL LOW (ref 8.9–10.3)
Chloride: 101 mmol/L (ref 98–111)
Creatinine, Ser: 0.76 mg/dL (ref 0.61–1.24)
GFR, Estimated: >60 mL/min (ref >60)
Glucose, Bld: 132 mg/dL — ABNORMAL HIGH (ref 70–99)
Potassium: 3.1 mmol/L — ABNORMAL LOW (ref 3.5–5.1)
Sodium: 136 mmol/L (ref 135–145)
Total Bilirubin: 0.9 mg/dL (ref 0.3–1.2)
Total Protein: 5.9 g/dL — ABNORMAL LOW (ref 6.5–8.1)

## 2020-05-18 LAB — CK
Total CK: 5632 U/L — ABNORMAL HIGH (ref 49–397)
Total CK: 3033 U/L — ABNORMAL HIGH (ref 49–397)

## 2020-05-18 LAB — LACTIC ACID, PLASMA: Lactic Acid, Venous: 1.6 mmol/L (ref 0.5–1.9)

## 2020-05-18 LAB — MAGNESIUM: Magnesium: 1.9 mg/dL (ref 1.7–2.4)

## 2020-05-18 MED ORDER — METHOCARBAMOL 500 MG PO TABS: 500.0000 mg | ORAL_TABLET | Freq: Three times a day (TID) | ORAL | Status: DC | PRN

## 2020-05-18 MED ORDER — POTASSIUM CHLORIDE CRYS ER 20 MEQ PO TBCR
40.0000 meq | EXTENDED_RELEASE_TABLET | Freq: Once | ORAL | Status: AC
  Administered 2020-05-18: 40 meq via ORAL
  Filled 2020-05-18: qty 4

## 2020-05-18 MED ORDER — NICOTINE 21 MG/24HR TD PT24
21.0000 mg | MEDICATED_PATCH | Freq: Every day | TRANSDERMAL | Status: DC
  Administered 2020-05-18 – 2020-05-19 (×2): 21 mg via TRANSDERMAL
  Filled 2020-05-18 (×2): qty 1

## 2020-05-18 MED ORDER — LACTATED RINGERS IV BOLUS
1000.0000 mL | Freq: Once | INTRAVENOUS | Status: AC
  Administered 2020-05-18: 1000 mL via INTRAVENOUS

## 2020-05-18 NOTE — Progress Notes (Signed)
[facility] Progress Note  Patient: Willie Herman    TWK:[id number]  DOA: 05/17/2020     Date of Service: the patient was seen and examined on 05/18/2020  Brief hospital course: Hospital depression, HTN, HLD, GERD, COPD, substance abuse, smoker. Presents with complaints of suicidal ideation. Found to have nontraumatic rhabdomyolysis.  Psychiatry was also consulted and recommended inpatient psychiatric admission. Currently plan is IV fluids, patient will be medically ready once CK level improves to less than 2000.  Assessment and Plan: 1.  Nontraumatic rhabdomyolysis CK on presentation was almost 10,000. Receiving IV fluids. Serum creatinine normal. CK trending down appropriately 5000 now. Continue with aggressive IV hydration and monitor CK.  2.  Hyponatremia, hypokalemia Currently being replaced. Sodium currently corrected. We will monitor potassium level.  3.  Suicidal ideation. Major depression. Intentional drug overdose Overdose on all his medications with intention to kill himself although patient was already prescribed 5 to 6 days of medication from his recent discharge from behavioral health. Poison control was consulted and recommended observation for 24 hours from admission, no acute events on telemetry.  QT not prolonged. Recent hospitalization for depression. Psychiatrically the patient differential recommend patient psychiatric admission. Continue one-to-one sitter. Cannot leave AMA. We will monitor.  4.  Substance abuse. UDS positive for amphetamine. Tylenol negative.  Salicylate negative. Counseled the patient to quit.  5.  Active smoker. Continue nicotine patch. Counseled the patient regarding importance of cessation.  Currently not ready.  6.  Essential hypertension Blood pressure mildly elevated.  Monitor.  Continue on Norvasc.  7.  Type 2 diabetes mellitus controlled without any long-term insulin use or complication. Hemoglobin A1c 6.5. On Metformin  at home. Currently on hold.  Monitor. Holding off on adding sliding scale for now.  8. Obesity Placing the patient at high risk for poor outcome. Monitor.  Body mass index is 30.75 kg/m.   Diet: Cardiac diet DVT Prophylaxis:   heparin injection 5,000 Units Start: 05/17/20 2230 SCDs Start: 05/17/20 2218    Advance goals of care discussion: Full code  Family Communication: no family was present at bedside, at the time of interview.   Disposition:  Status is: Inpatient  Remains inpatient appropriate because: Needing IV fluids.  Once CK is less than 2000, patient will require inpatient psychiatric admission. Cannot leave AMA  Dispo:  Patient From: Home  Planned Disposition: Behavioral Health  Expected discharge date: 05/19/2020  Medically stable for discharge: No          Subjective: No nausea no vomiting.  Reports more generalized body ache.  No fever no chills.  No diarrhea.  Reports constipation.  Physical Exam:  General: Appear in mild distress, no Rash; Oral Mucosa Clear, moist. no Abnormal Neck Mass Or lumps, Conjunctiva normal  Cardiovascular: S1 and S2 Present, no Murmur, Respiratory: good respiratory effort, Bilateral Air entry present and CTA, no Crackles, no wheezes Abdomen: Bowel Sound present, Soft and no tenderness Extremities: no Pedal edema Neurology: alert and oriented to time, place, and person affect appropriate. no new focal deficit Gait not checked due to patient safety concerns  Vitals:   05/18/20 1245 05/18/20 1300 05/18/20 1315 05/18/20 1330  BP:  135/81  138/83  Pulse: 66 68 67 70  Resp: 16 13 13 16   Temp:      TempSrc:      SpO2: 93% 93% 94% 96%  Weight:      Height:       No intake or output data in the 24  hours ending 05/18/20 1527 Filed Weights   05/17/20 1839  Weight: 100 kg    Data Reviewed: I have personally reviewed and interpreted daily labs, tele strips, imaging. I reviewed all nursing notes, pharmacy notes, vitals,  pertinent old records I have discussed plan of care as described above with RN and patient/family.  CBC: Recent Labs  Lab 05/17/20 1900 05/18/20 0405  WBC 12.4* 7.9  NEUTROABS 8.2* 4.2  HGB 13.3 13.0  HCT 37.8* 37.5*  MCV 86.1 86.2  PLT 248 216   Basic Metabolic Panel: Recent Labs  Lab 05/17/20 1900 05/18/20 0405  NA 128* 136  K 3.2* 3.1*  CL 96* 101  CO2 22 26  GLUCOSE 96 132*  BUN 33* 23*  CREATININE 0.91 0.76  CALCIUM 8.3* 8.2*  MG 1.9 1.9    Studies: No results found.  Scheduled Meds: . amLODipine  10 mg Oral Daily  . heparin  5,000 Units Subcutaneous Q8H  . nicotine  21 mg Transdermal Daily  . pantoprazole  40 mg Oral Daily  . sucralfate  1 g Oral TID WC & HS   Continuous Infusions: . sodium chloride 200 mL/hr at 05/18/20 0620   PRN Meds: acetaminophen **OR** acetaminophen, methocarbamol, morphine injection, ondansetron **OR** ondansetron (ZOFRAN) IV  Time spent: 35 minutes  Author: Lynden Oxford, MD [facility] 05/18/2020 3:27 PM  To reach On-call, see care teams to locate the attending and reach out via [email or web address]. Between 7PM-7AM, please contact night-coverage If you still have difficulty reaching the attending provider, please page the [facility] (Director on Call) for [facility] on amion for assistance.
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
## 2020-05-19 ENCOUNTER — Encounter (HOSPITAL_COMMUNITY): Payer: Self-pay | Admitting: Psychiatric/Mental Health

## 2020-05-19 ENCOUNTER — Other Ambulatory Visit: Payer: Self-pay | Admitting: Psychiatric/Mental Health

## 2020-05-19 ENCOUNTER — Inpatient Hospital Stay (HOSPITAL_COMMUNITY)
Admission: AD | Admit: 2020-05-19 | Discharge: 2020-05-23 | DRG: 897 | Disposition: A | Discharge status: Home / Self Care | Payer: Federal, State, Local not specified - Other | Source: Intra-hospital | Attending: Psychiatry | Admitting: Psychiatry

## 2020-05-19 ENCOUNTER — Other Ambulatory Visit: Payer: Self-pay

## 2020-05-19 DIAGNOSIS — I10 Essential (primary) hypertension: Secondary | ICD-10-CM | POA: Diagnosis present

## 2020-05-19 DIAGNOSIS — K219 Gastro-esophageal reflux disease without esophagitis: Secondary | ICD-10-CM | POA: Diagnosis present

## 2020-05-19 DIAGNOSIS — T43222A Poisoning by selective serotonin reuptake inhibitors, intentional self-harm, initial encounter: Secondary | ICD-10-CM | POA: Diagnosis present

## 2020-05-19 DIAGNOSIS — J449 Chronic obstructive pulmonary disease, unspecified: Secondary | ICD-10-CM | POA: Diagnosis present

## 2020-05-19 DIAGNOSIS — Z7984 Long term (current) use of oral hypoglycemic drugs: Secondary | ICD-10-CM

## 2020-05-19 DIAGNOSIS — F1524 Other stimulant dependence with stimulant-induced mood disorder: Secondary | ICD-10-CM | POA: Diagnosis present

## 2020-05-19 DIAGNOSIS — M6282 Rhabdomyolysis: Secondary | ICD-10-CM | POA: Diagnosis present

## 2020-05-19 DIAGNOSIS — F329 Major depressive disorder, single episode, unspecified: Secondary | ICD-10-CM | POA: Diagnosis present

## 2020-05-19 DIAGNOSIS — F419 Anxiety disorder, unspecified: Secondary | ICD-10-CM | POA: Diagnosis present

## 2020-05-19 DIAGNOSIS — E119 Type 2 diabetes mellitus without complications: Secondary | ICD-10-CM | POA: Diagnosis present

## 2020-05-19 DIAGNOSIS — F1994 Other psychoactive substance use, unspecified with psychoactive substance-induced mood disorder: Secondary | ICD-10-CM | POA: Diagnosis present

## 2020-05-19 DIAGNOSIS — E7800 Pure hypercholesterolemia, unspecified: Secondary | ICD-10-CM | POA: Diagnosis present

## 2020-05-19 DIAGNOSIS — Z91018 Allergy to other foods: Secondary | ICD-10-CM

## 2020-05-19 DIAGNOSIS — Z818 Family history of other mental and behavioral disorders: Secondary | ICD-10-CM | POA: Diagnosis not present

## 2020-05-19 DIAGNOSIS — F17210 Nicotine dependence, cigarettes, uncomplicated: Secondary | ICD-10-CM | POA: Diagnosis present

## 2020-05-19 DIAGNOSIS — Z79899 Other long term (current) drug therapy: Secondary | ICD-10-CM | POA: Diagnosis not present

## 2020-05-19 LAB — BASIC METABOLIC PANEL
Anion gap: 8 (ref 5–15)
BUN: 8 mg/dL (ref 6–20)
CO2: 25 mmol/L (ref 22–32)
Calcium: 8.2 mg/dL — ABNORMAL LOW (ref 8.9–10.3)
Chloride: 104 mmol/L (ref 98–111)
Creatinine, Ser: 0.68 mg/dL (ref 0.61–1.24)
GFR, Estimated: >60 mL/min (ref >60)
Glucose, Bld: 133 mg/dL — ABNORMAL HIGH (ref 70–99)
Potassium: 3.6 mmol/L (ref 3.5–5.1)
Sodium: 137 mmol/L (ref 135–145)

## 2020-05-19 LAB — MAGNESIUM: Magnesium: 1.7 mg/dL (ref 1.7–2.4)

## 2020-05-19 LAB — CK: Total CK: 1801 U/L — ABNORMAL HIGH (ref 49–397)

## 2020-05-19 MED ORDER — TRAZODONE HCL 50 MG PO TABS
50.0000 mg | ORAL_TABLET | Freq: Every evening | ORAL | Status: DC | PRN
Start: 2020-05-19 — End: 2020-05-23
  Administered 2020-05-19 – 2020-05-22 (×4): 50 mg via ORAL
  Filled 2020-05-19 (×4): qty 1

## 2020-05-19 MED ORDER — NICOTINE 21 MG/24HR TD PT24: 21.0000 mg | MEDICATED_PATCH | Freq: Every day | TRANSDERMAL | 0 refills | Status: DC

## 2020-05-19 MED ORDER — MAGNESIUM HYDROXIDE 400 MG/5ML PO SUSP
30.0000 mL | Freq: Every day | ORAL | Status: DC | PRN
Start: 2020-05-19 — End: 2020-05-23

## 2020-05-19 MED ORDER — SUCRALFATE 1 G PO TABS
1.0000 g | ORAL_TABLET | Freq: Three times a day (TID) | ORAL | Status: DC
  Administered 2020-05-19 – 2020-05-23 (×14): 1 g via ORAL
  Filled 2020-05-19 (×19): qty 1

## 2020-05-19 MED ORDER — ALUM & MAG HYDROXIDE-SIMETH 200-200-20 MG/5ML PO SUSP: 30.0000 mL | ORAL | Status: DC | PRN

## 2020-05-19 MED ORDER — ACETAMINOPHEN 325 MG PO TABS: 650.0000 mg | ORAL_TABLET | Freq: Four times a day (QID) | ORAL | Status: DC | PRN

## 2020-05-19 MED ORDER — NICOTINE 21 MG/24HR TD PT24
21.0000 mg | MEDICATED_PATCH | Freq: Every day | TRANSDERMAL | Status: DC
  Administered 2020-05-20 – 2020-05-23 (×3): 21 mg via TRANSDERMAL
  Filled 2020-05-19 (×5): qty 1

## 2020-05-19 MED ORDER — PANTOPRAZOLE SODIUM 40 MG PO TBEC
40.0000 mg | DELAYED_RELEASE_TABLET | Freq: Every day | ORAL | Status: DC
  Administered 2020-05-20 – 2020-05-23 (×4): 40 mg via ORAL
  Filled 2020-05-19 (×5): qty 1

## 2020-05-19 MED ORDER — HYDROXYZINE HCL 25 MG PO TABS
25.0000 mg | ORAL_TABLET | Freq: Three times a day (TID) | ORAL | Status: DC | PRN
  Administered 2020-05-19 – 2020-05-22 (×4): 25 mg via ORAL
  Filled 2020-05-19 (×4): qty 1

## 2020-05-19 MED ORDER — METFORMIN HCL 500 MG PO TABS
500.0000 mg | ORAL_TABLET | Freq: Every day | ORAL | Status: DC
  Administered 2020-05-20 – 2020-05-23 (×4): 500 mg via ORAL
  Filled 2020-05-19 (×5): qty 1

## 2020-05-19 MED ORDER — TRAZODONE HCL 50 MG PO TABS: 50.0000 mg | ORAL_TABLET | Freq: Every evening | ORAL | Status: DC | PRN

## 2020-05-19 MED ORDER — AMLODIPINE BESYLATE 10 MG PO TABS
10.0000 mg | ORAL_TABLET | Freq: Every day | ORAL | Status: DC
  Administered 2020-05-20 – 2020-05-22 (×3): 10 mg via ORAL
  Filled 2020-05-19 (×5): qty 1

## 2020-05-19 NOTE — BH Assessment (Addendum)
Pending transfer to [facility].

## 2020-05-19 NOTE — Progress Notes (Signed)
Psychoeducational Group Note  Date:  05/19/2020 Time:  2246  Group Topic/Focus:  Wrap-Up Group:   The focus of this group is to help patients review their daily goal of treatment and discuss progress on daily workbooks.  Participation Level: Did Not Attend  Participation Quality:  Not Applicable  Affect:  Not Applicable  Cognitive:  Not Applicable  Insight:  Not Applicable  Engagement in Group: Not Applicable  Additional Comments:  The patient was admitted after the group concluded.   Hazle Coca S 05/19/2020, 10:46 PM

## 2020-05-19 NOTE — Discharge Summary (Addendum)
[facility] Discharge Summary   Patient: Willie Herman ZDG:[id number]  PCP: Patient, No Pcp Per  Date of admission: 05/17/2020   Date of discharge:  05/19/2020     Discharge Diagnoses:  Principal Problem:   Rhabdomyolysis Active Problems:   Stimulant abuse ([place])   Nicotine abuse   Suicidal ideation   Hyponatremia   Hypokalemia   Admitted From: home Disposition:  [facility] can not leave AMA Medically stable for discharge to [facility]  Recommendations for Outpatient Follow-up:  1. PCP: follow up when discharged from [facility] 2. Follow up LABS/TEST:  none   Diet recommendation: Regular diet  Activity: The patient is advised to gradually reintroduce usual activities, as tolerated  Discharge Condition: stable, Medically stable for discharge to [facility]  Code Status: Full code   History of present illness: As per the H and P dictated on admission, "Willie Herman  is a 45 y.o. male, with history of major depressive disorder, hypertension, hyperlipidemia, GERD, prediabetes, COPD, nicotine dependence, substance abuse, and more presents to the ED with a chief complaint of suicidal ideation.  Patient reports that he is here because he felt bad and was suicidal.  Patient has difficulty with timeline.  He thinks that he was discharged from behavioral health on 18 February.  On 19 February he took all of his medications with intent to harm himself.  He reports that the trigger was that he was around the wrong people, used meth, was disappointed in himself and therefore wanted to hurt himself.  He does not member anything between 72 the evening and the 21st morning.  He reports that he woke up under a tree and a briar patch.  He had injuries on his body like a bruise on his right upper arm that he is not aware of what happened.  He reports he has a history of suicide attempt in 2013.  He reports that his recent hospitalization he was not attempting suicide.  He has no family history of suicides.  Patient reports that he  has all over body pain like he has been beat up.  He reports that it feels like he is walks 12 miles and his legs are hurting.  Reports he has a headache in the front of his head, no change in vision, no change in hearing.  He reports that he does get headaches both tension and caffeine withdrawal.  He reports that this feels like his normal headache.  Reports that the sandwich that he had in the ED was his first meal in several days.  Patient again reports that he cannot remember anything until Monday morning, on separate questioning does state that this is Tuesday, but when trying to piece together his history it seems as though he does not remember anything until this morning.  Especially considering that patient did present to the ED with EMS yesterday and has no recollection of that.  He left without being seen yesterday.  Patient reports that he has dysuria, no change in color of urine.  He reports that he has had rhabdomyolysis in the past and was admitted at [facility] for that.  In the ED Poison control was consulted.  Patient was reportedly only given 5 or 6 days worth of his meds so that he would follow-up with psych.  He reports that he took all 5 to 6 days worth of meds and meth.  He is not exactly sure the name of the medications that he took.  In the system we  have several psych meds listed.  Poison control recommended observation for 24 hours from the time of his arrival in the ED, which was 6:09 PM on 05/17/2020."  Hospital Course:  Summary of his active problems in the hospital is as following.  1.  Nontraumatic rhabdomyolysis CK on presentation was almost 10,000. Serum creatinine normal. CK trending down appropriately <2000 now. Treated with aggressive IV hydration and monitor CK. Encourage PO fluids.   2.  Hyponatremia, hypokalemia Currently replaced Sodium currently corrected.  3.  Suicidal ideation. Major depression. Intentional drug overdose Overdose on all his  medications with intention to kill himself although patient was only prescribed 5 to 6 days of medication from his recent discharge from behavioral health. Poison control was consulted and recommended observation for 24 hours from admission, no acute events on telemetry.  QT not prolonged. Recent hospitalization for depression. Psych consulted,  recommend inpatient psychiatric admission. Continue one-to-one sitter. Cannot leave AMA. We will monitor.  4.  Substance abuse. UDS positive for amphetamine. Tylenol negative.  Salicylate negative. Counseled the patient to quit.  5.  Active smoker. Continue nicotine patch. Counseled the patient regarding importance of cessation.   Currently not ready.  6.  Essential hypertension Blood pressure mildly elevated.  Monitor.  Continue on Norvasc.  7.  Type 2 diabetes mellitus controlled without any long-term insulin use or complication. Hemoglobin A1c 6.5. On Metformin at home. Currently on hold.  Monitor. Holding off on adding sliding scale for now.  8. Obesity Placing the patient at high risk for poor outcome. Monitor.  Body mass index is 30.75 kg/m.   On the day of the discharge the patient's vitals were stable, and no other acute medical condition were reported by patient. The patient was felt safe to be discharge at [facility] with psychiatry.  Consultants: psych Procedures: none  DISCHARGE MEDICATION: Allergies as of 05/19/2020      Reactions   Pineapple Shortness Of Breath, Rash      Medication List    STOP taking these medications   Aspirin-Caffeine 845-65 MG Pack   buPROPion 150 MG 24 hr tablet Commonly known as: WELLBUTRIN XL   citalopram 20 MG tablet Commonly known as: CELEXA     TAKE these medications   amLODipine 10 MG tablet Commonly known as: NORVASC Take 1 tablet (10 mg total) by mouth daily.   blood glucose meter kit and supplies Kit Dispense based on patient and insurance preference. Use up to four  times daily as directed. (FOR ICD-9 250.00, 250.01).   hydrOXYzine 25 MG tablet Commonly known as: ATARAX/VISTARIL Take 1 tablet (25 mg total) by mouth 3 (three) times daily as needed for itching, anxiety or nausea.   metFORMIN 500 MG tablet Commonly known as: GLUCOPHAGE Take 1 tablet (500 mg total) by mouth daily with breakfast.   nicotine 21 mg/24hr patch Commonly known as: NICODERM CQ - dosed in mg/24 hours Place 1 patch (21 mg total) onto the skin daily. Start taking on: May 20, 2020   pantoprazole 40 MG tablet Commonly known as: PROTONIX Take 1 tablet (40 mg total) by mouth daily.   sucralfate 1 GM/10ML suspension Commonly known as: CARAFATE Take 10 mLs (1 g total) by mouth 4 (four) times daily -  with meals and at bedtime.   traZODone 100 MG tablet Commonly known as: DESYREL Take 1 tablet (100 mg total) by mouth at bedtime as needed for sleep.       Discharge Exam: [facility] Weights   05/17/20 1839  Weight: 100 kg   Vitals:   05/19/20 0630 05/19/20 0939  BP: 122/82 128/83  Pulse: 64 68  Resp: (!) 21 18  Temp:  98.3 F (36.8 C)  SpO2: 96% 100%   General: Appear in no distress, no Rash; Oral Mucosa Clear, moist. no Abnormal Neck Mass Or lumps, Conjunctiva normal  Cardiovascular: S1 and S2 Present, no Murmur, Respiratory: good respiratory effort, Bilateral Air entry present and CTA, no Crackles, no wheezes Abdomen: Bowel Sound present, Soft and no tenderness Extremities: no Pedal edema Neurology: alert and oriented to time, place, and person affect appropriate. no new focal deficit Gait not checked due to patient safety concerns    The results of significant diagnostics from this hospitalization (including imaging, microbiology, ancillary and laboratory) are listed below for reference.    Significant Diagnostic Studies: No results found.  Microbiology: Recent Results (from the past 240 hour(s))  Resp Panel by RT-PCR (Flu A&B, Covid) Nasopharyngeal Swab      Status: None   Collection Time: 05/17/20  8:43 PM   Specimen: Nasopharyngeal Swab; Nasopharyngeal(NP) swabs in vial transport medium  Result Value Ref Range Status   SARS Coronavirus 2 by RT PCR NEGATIVE NEGATIVE Final    Comment: (NOTE) SARS-CoV-2 target nucleic acids are NOT DETECTED.  The SARS-CoV-2 RNA is generally detectable in upper respiratory specimens during the acute phase of infection. The lowest concentration of SARS-CoV-2 viral copies this assay can detect is 138 copies/mL. A negative result does not preclude SARS-Cov-2 infection and should not be used as the sole basis for treatment or other patient management decisions. A negative result may occur with  improper specimen collection/handling, submission of specimen other than nasopharyngeal swab, presence of viral mutation(s) within the areas targeted by this assay, and inadequate number of viral copies(<138 copies/mL). A negative result must be combined with clinical observations, patient history, and epidemiological information. The expected result is Negative.  Fact Sheet for Patients:  EntrepreneurPulse.com.au  Fact Sheet for Healthcare Providers:  IncredibleEmployment.be  This test is no t yet approved or cleared by the [place] FDA and  has been authorized for detection and/or diagnosis of SARS-CoV-2 by FDA under an Emergency Use Authorization (EUA). This EUA will remain  in effect (meaning this test can be used) for the duration of the COVID-19 declaration under Section 564(b)(1) of the Act, 21 U.S.C.section 360bbb-3(b)(1), unless the authorization is terminated  or revoked sooner.       Influenza A by PCR NEGATIVE NEGATIVE Final   Influenza B by PCR NEGATIVE NEGATIVE Final    Comment: (NOTE) The Xpert Xpress SARS-CoV-2/FLU/RSV plus assay is intended as an aid in the diagnosis of influenza from Nasopharyngeal swab specimens and should not be used as a sole basis  for treatment. Nasal washings and aspirates are unacceptable for Xpert Xpress SARS-CoV-2/FLU/RSV testing.  Fact Sheet for Patients: EntrepreneurPulse.com.au  Fact Sheet for Healthcare Providers: IncredibleEmployment.be  This test is not yet approved or cleared by the [place] FDA and has been authorized for detection and/or diagnosis of SARS-CoV-2 by FDA under an Emergency Use Authorization (EUA). This EUA will remain in effect (meaning this test can be used) for the duration of the COVID-19 declaration under Section 564(b)(1) of the Act, 21 U.S.C. section 360bbb-3(b)(1), unless the authorization is terminated or revoked.  Performed at [facility], [address] [place], [place] [address]      Labs: CBC: Recent Labs  Lab 05/17/20 1900 05/18/20 0405  WBC 12.4* 7.9  NEUTROABS 8.2*  4.2  HGB 13.3 13.0  HCT 37.8* 37.5*  MCV 86.1 86.2  PLT 248 425   Basic Metabolic Panel: Recent Labs  Lab 05/17/20 1900 05/18/20 0405 05/18/20 1700 05/19/20 0439  NA 128* 136 133* 137  K 3.2* 3.1* 4.0 3.6  CL 96* 101 101 104  CO2 '22 26 27 25  ' GLUCOSE 96 132* 137* 133*  BUN 33* 23* 13 8  CREATININE 0.91 0.76 0.70 0.68  CALCIUM 8.3* 8.2* 8.1* 8.2*  MG 1.9 1.9  --  1.7   Liver Function Tests: Recent Labs  Lab 05/17/20 1900 05/18/20 0405  AST 138* 95*  ALT 44 36  ALKPHOS 49 42  BILITOT 2.2* 0.9  PROT 6.8 5.9*  ALBUMIN 4.2 3.5   CBG: Recent Labs  Lab 05/13/20 0815 05/14/20 0625  GLUCAP 256* 125*    Time spent: 35 minutes  Signed:  Berle Mull  [facility]  05/19/2020 11:55 AM
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
## 2020-05-19 NOTE — Clinical Social Work Note (Signed)
Michael with TTS notified that patient is medically cleared.      Makih Stefanko, Juleen China, LCSW

## 2020-05-19 NOTE — Progress Notes (Signed)
CSW sent patient information to be reviewed by [facility] AC/RN for admission consideration. Situation ongoing, CSW will continue to follow disposition.   Signed:  Corky Crafts, MSW, [place], LCASA 05/19/2020 2:18 PM

## 2020-05-19 NOTE — ED Notes (Signed)
Attempted to call report to [facility], phone number given to call back.

## 2020-05-19 NOTE — Tx Team (Signed)
Initial Treatment Plan 05/19/2020 10:39 PM [name] [id number]    PATIENT STRESSORS: Occupational concerns Substance abuse   PATIENT STRENGTHS: Average or above average intelligence Motivation for treatment/growth Physical Health Supportive family/friends   PATIENT IDENTIFIED PROBLEMS: Substance use d/o - methamphetamine use  Suicide attempt - OD on prescription meds  (pt wants to work on staying away from the wrong people)                 DISCHARGE CRITERIA:  Improved stabilization in mood, thinking, and/or behavior Motivation to continue treatment in a less acute level of care Verbal commitment to aftercare and medication compliance  PRELIMINARY DISCHARGE PLAN: Attend aftercare/continuing care group Attend PHP/IOP Outpatient therapy Return to previous living arrangement  PATIENT/FAMILY INVOLVEMENT: This treatment plan has been presented to and reviewed with the patient, Willie Herman, and/or family member.  The patient and family have been given the opportunity to ask questions and make suggestions.  Victorino December, RN 05/19/2020, 10:39 PM

## 2020-05-19 NOTE — Progress Notes (Signed)
   05/19/20 2030  Psych Admission Type (Psych Patients Only)  Admission Status Involuntary  Psychosocial Assessment  Patient Complaints Other (Comment) (mad at himself for relapsing on meth)  Eye Contact Fair  Facial Expression Other (Comment) (appropriate)  Affect Appropriate to circumstance  Speech Logical/coherent  Interaction Assertive  Motor Activity Other (Comment) (wnl)  Appearance/Hygiene In scrubs  Behavior Characteristics Cooperative;Appropriate to situation  Mood Depressed;Pleasant  Thought Process  Coherency WDL  Content WDL  Delusions None reported or observed  Perception WDL  Hallucination None reported or observed  Judgment Poor  Confusion Mild (can't remember what happened since Monday)  Danger to Self  Current suicidal ideation? Denies  Danger to Others  Danger to Others None reported or observed

## 2020-05-20 DIAGNOSIS — F1994 Other psychoactive substance use, unspecified with psychoactive substance-induced mood disorder: Secondary | ICD-10-CM | POA: Diagnosis not present

## 2020-05-20 LAB — LIPID PANEL
Cholesterol: 166 mg/dL (ref 0–200)
HDL: 34 mg/dL — ABNORMAL LOW (ref >40)
LDL Cholesterol: 115 mg/dL — ABNORMAL HIGH (ref 0–99)
Total CHOL/HDL Ratio: 4.9 RATIO
Triglycerides: 83 mg/dL (ref <150)
VLDL: 17 mg/dL (ref 0–40)

## 2020-05-20 LAB — CK TOTAL AND CKMB (NOT AT ARMC)
CK, MB: 5.6 ng/mL — ABNORMAL HIGH (ref 0.5–5.0)
Relative Index: 0.9 (ref 0.0–2.5)
Total CK: 643 U/L — ABNORMAL HIGH (ref 49–397)

## 2020-05-20 LAB — TSH: TSH: 3.114 u[IU]/mL (ref 0.350–4.500)

## 2020-05-20 LAB — GLUCOSE, CAPILLARY
Glucose-Capillary: 129 mg/dL — ABNORMAL HIGH (ref 70–99)
Glucose-Capillary: 136 mg/dL — ABNORMAL HIGH (ref 70–99)

## 2020-05-20 LAB — HEMOGLOBIN A1C
Hgb A1c MFr Bld: 6.8 % — ABNORMAL HIGH (ref 4.8–5.6)
Mean Plasma Glucose: 148.46 mg/dL

## 2020-05-20 MED ORDER — BUPROPION HCL ER (XL) 150 MG PO TB24
150.0000 mg | ORAL_TABLET | Freq: Every day | ORAL | Status: DC
  Administered 2020-05-20 – 2020-05-22 (×3): 150 mg via ORAL
  Filled 2020-05-20 (×5): qty 1

## 2020-05-20 NOTE — Progress Notes (Signed)
Recreation Therapy Notes  Date: 2.25.22 Time: 0930 Location: [address] Dayroom  Group Topic: Stress Management  Goal Area(s) Addresses:  Patient will identify positive stress management techniques. Patient will identify benefits of using stress management post d/c.  Intervention: Stress Management  Activity:  Progressive Muscle Relaxation.  LRT read a script that lead the group in tensing and then relaxing each muscle group individually.  Patients were to follow as script was read to engage in move to gain as much looseness in the muscles as possible.    Education: Stress Management, Discharge Planning.   Education Outcome: Acknowledges Education  Clinical Observations/Feedback: Pt did not attend group session.    Caroll Rancher, LRT/CTRS         Caroll Rancher A 05/20/2020 11:24 AM
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
## 2020-05-20 NOTE — Progress Notes (Signed)
Progress note    05/20/20 0805  Psych Admission Type (Psych Patients Only)  Admission Status Involuntary  Psychosocial Assessment  Patient Complaints Anxiety;Sadness  Eye Contact Fair  Facial Expression Anxious;Sullen;Sad;Worried  Affect Anxious;Depressed;Sad;Sullen  Furniture conservator/restorer  Appearance/Hygiene In scrubs  Behavior Characteristics Cooperative;Appropriate to situation;Anxious;Fidgety  Mood Depressed;Anxious;Sad;Sullen;Pleasant  Thought Administrator, sports thinking  Content Blaming self  Delusions None reported or observed  Perception WDL  Hallucination None reported or observed  Judgment Poor  Confusion None  Danger to Self  Current suicidal ideation? Denies  Danger to Others  Danger to Others None reported or observed
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
## 2020-05-20 NOTE — Plan of Care (Signed)
  Problem: Education: Goal: Knowledge of  General Education information/materials will improve Outcome: Progressing Goal: Emotional status will improve Outcome: Progressing Goal: Mental status will improve Outcome: Progressing Goal: Verbalization of understanding the information provided will improve Outcome: Progressing   

## 2020-05-20 NOTE — [facility] Group Notes (Signed)
Adult Psychoeducational Group Note  Date:  05/20/2020 Time:  4:48 PM  Group Topic/Focus:  Developing a Wellness Toolbox:   The focus of this group is to help patients develop a "wellness toolbox" with skills and strategies to promote recovery upon discharge.  Participation Level:  Active  Participation Quality:  Appropriate  Affect:  Appropriate  Cognitive:  Appropriate  Insight: Appropriate  Engagement in Group:  Engaged  Modes of Intervention:  Discussion  Additional Comments:  Patient did not attend Nurse led group, although he was made aware of it.   Delicia W Forkpah 05/20/2020, 4:48 PM
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
## 2020-05-20 NOTE — [facility] Suicide Risk Assessment (Signed)
[facility] INPATIENT:  Family/Significant Other Suicide Prevention Education  Suicide Prevention Education:  Patient Refusal for Family/Significant Other Suicide Prevention Education: The patient Willie Herman has refused to provide written consent for family/significant other to be provided Family/Significant Other Suicide Prevention Education during admission and/or prior to discharge.  Physician notified.  CSW completed SPE with the patient.  A pamphlet about suicide prevention will be placed in the patient's chart for review at discharge.   Metro Kung Tadan Shill 05/20/2020, 10:17 AM

## 2020-05-20 NOTE — Tx Team (Signed)
Interdisciplinary Treatment and Diagnostic Plan Update  05/20/2020 Time of Session: 9:40 Willie Herman MRN: [id number]  Principal Diagnosis: <principal problem not specified>  Secondary Diagnoses: Active Problems:   Substance induced mood disorder (HCC)   Current Medications:  Current Facility-Administered Medications  Medication Dose Route Frequency Provider Last Rate Last Admin  . acetaminophen (TYLENOL) tablet 650 mg  650 mg Oral Q6H PRN Connye Burkitt, NP      . alum & mag hydroxide-simeth (MAALOX/MYLANTA) 200-200-20 MG/5ML suspension 30 mL  30 mL Oral Q4H PRN Connye Burkitt, NP      . amLODipine (NORVASC) tablet 10 mg  10 mg Oral Daily Connye Burkitt, NP   10 mg at 05/20/20 0759  . buPROPion (WELLBUTRIN XL) 24 hr tablet 150 mg  150 mg Oral Daily Sharma Covert, MD      . hydrOXYzine (ATARAX/VISTARIL) tablet 25 mg  25 mg Oral TID PRN Connye Burkitt, NP   25 mg at 05/19/20 2114  . magnesium hydroxide (MILK OF MAGNESIA) suspension 30 mL  30 mL Oral Daily PRN Connye Burkitt, NP      . metFORMIN (GLUCOPHAGE) tablet 500 mg  500 mg Oral Q breakfast Connye Burkitt, NP   500 mg at 05/20/20 0759  . nicotine (NICODERM CQ - dosed in mg/24 hours) patch 21 mg  21 mg Transdermal Daily Connye Burkitt, NP   21 mg at 05/20/20 0759  . pantoprazole (PROTONIX) EC tablet 40 mg  40 mg Oral Daily Connye Burkitt, NP   40 mg at 05/20/20 0759  . sucralfate (CARAFATE) tablet 1 g  1 g Oral TID WC & HS Connye Burkitt, NP   1 g at 05/20/20 0759  . traZODone (DESYREL) tablet 50 mg  50 mg Oral QHS PRN Connye Burkitt, NP   50 mg at 05/19/20 2114   PTA Medications: Medications Prior to Admission  Medication Sig Dispense Refill Last Dose  . amLODipine (NORVASC) 10 MG tablet Take 1 tablet (10 mg total) by mouth daily. 30 tablet 0   . blood glucose meter kit and supplies KIT Dispense based on patient and insurance preference. Use up to four times daily as directed. (FOR ICD-9 250.00, 250.01). 1 each 0   .  hydrOXYzine (ATARAX/VISTARIL) 25 MG tablet Take 1 tablet (25 mg total) by mouth 3 (three) times daily as needed for itching, anxiety or nausea. 30 tablet 0   . metFORMIN (GLUCOPHAGE) 500 MG tablet Take 1 tablet (500 mg total) by mouth daily with breakfast. 30 tablet 0   . nicotine (NICODERM CQ - DOSED IN MG/24 HOURS) 21 mg/24hr patch Place 1 patch (21 mg total) onto the skin daily. 28 patch 0   . pantoprazole (PROTONIX) 40 MG tablet Take 1 tablet (40 mg total) by mouth daily. 30 tablet 0   . sucralfate (CARAFATE) 1 GM/10ML suspension Take 10 mLs (1 g total) by mouth 4 (four) times daily -  with meals and at bedtime. 420 mL 0   . traZODone (DESYREL) 100 MG tablet Take 1 tablet (100 mg total) by mouth at bedtime as needed for sleep. 30 tablet 0     Patient Stressors: Occupational concerns Substance abuse  Patient Strengths: Average or above average intelligence Motivation for treatment/growth Physical Health Supportive family/friends  Treatment Modalities: Medication Management, Group therapy, Case management,  1 to 1 session with clinician, Psychoeducation, Recreational therapy.   Physician Treatment Plan for Primary Diagnosis: <principal problem not specified> Long  Term Goal(s):     Short Term Goals:    Medication Management: Evaluate patient's response, side effects, and tolerance of medication regimen.  Therapeutic Interventions: 1 to 1 sessions, Unit Group sessions and Medication administration.  Evaluation of Outcomes: Not Met  Physician Treatment Plan for Secondary Diagnosis: Active Problems:   Substance induced mood disorder ([place])  Long Term Goal(s):     Short Term Goals:       Medication Management: Evaluate patient's response, side effects, and tolerance of medication regimen.  Therapeutic Interventions: 1 to 1 sessions, Unit Group sessions and Medication administration.  Evaluation of Outcomes: Not Met   RN Treatment Plan for Primary Diagnosis: <principal problem  not specified> Long Term Goal(s): Knowledge of disease and therapeutic regimen to maintain health will improve  Short Term Goals: Ability to participate in decision making will improve, Ability to verbalize feelings will improve and Ability to disclose and discuss suicidal ideas  Medication Management: RN will administer medications as ordered by provider, will assess and evaluate patient's response and provide education to patient for prescribed medication. RN will report any adverse and/or side effects to prescribing provider.  Therapeutic Interventions: 1 on 1 counseling sessions, Psychoeducation, Medication administration, Evaluate responses to treatment, Monitor vital signs and CBGs as ordered, Perform/monitor CIWA, COWS, AIMS and Fall Risk screenings as ordered, Perform wound care treatments as ordered.  Evaluation of Outcomes: Not Met   LCSW Treatment Plan for Primary Diagnosis: <principal problem not specified> Long Term Goal(s): Safe transition to appropriate next level of care at discharge, Engage patient in therapeutic group addressing interpersonal concerns.  Short Term Goals: Engage patient in aftercare planning with referrals and resources, Increase social support and Increase ability to appropriately verbalize feelings  Therapeutic Interventions: Assess for all discharge needs, 1 to 1 time with Social worker, Explore available resources and support systems, Assess for adequacy in community support network, Educate family and significant other(s) on suicide prevention, Complete Psychosocial Assessment, Interpersonal group therapy.  Evaluation of Outcomes: Not Met   Progress in Treatment: Attending groups: No. and As evidenced by:  pt recently admitted Participating in groups: No. and As evidenced by:  pt recently admitted Taking medication as prescribed: Yes. Toleration medication: Yes. Family/Significant other contact made: No, will contact:  declined consents Patient  understands diagnosis: Yes. Discussing patient identified problems/goals with staff: Yes. Medical problems stabilized or resolved: Yes. Denies suicidal/homicidal ideation: Yes. Issues/concerns per patient self-inventory: No. Other: None  New problem(s) identified: No, Describe:  CSW will continue to assess  New Short Term/Long Term Goal(s): medication stabilization, elimination of SI thoughts, development of comprehensive mental wellness plan.  Patient Goals: "work on my depression."   Discharge Plan or Barriers: Patient recently admitted. CSW will continue to follow and assess for appropriate referrals and possible discharge planning.  Reason for Continuation of Hospitalization: Medication stabilization Suicidal ideation Withdrawal symptoms  Estimated Length of Stay: 3-5 days  Attendees: Patient: Willie Herman 05/20/2020   Physician: Lala Lund, MD 05/20/2020   Nursing:  05/20/2020   RN Care Manager: 05/20/2020   Social Worker: Toney Reil, Latanya Presser 05/20/2020   Recreational Therapist:  05/20/2020   Other:  05/20/2020   Other:  05/20/2020   Other: 05/20/2020      Scribe for Treatment Team: Mliss Fritz, Latanya Presser 05/20/2020 10:35 AM

## 2020-05-20 NOTE — [facility] Group Notes (Signed)
Type of Therapy: Gratitude   Participation Level:  Did Not Attend  Summary of Progress/Problems: Pt did not attend the group 

## 2020-05-21 DIAGNOSIS — F1994 Other psychoactive substance use, unspecified with psychoactive substance-induced mood disorder: Secondary | ICD-10-CM | POA: Diagnosis not present

## 2020-05-21 LAB — GLUCOSE, CAPILLARY
Glucose-Capillary: 141 mg/dL — ABNORMAL HIGH (ref 70–99)
Glucose-Capillary: 117 mg/dL — ABNORMAL HIGH (ref 70–99)

## 2020-05-21 NOTE — [facility] Group Notes (Signed)
LCSW Group Therapy Note  05/21/2020 12:09 PM  Type of Therapy and Topic:  Group Therapy:  Feelings around Relapse and Recovery  Participation Level:  Did Not Attend   Description of Group:    Patients in this group will discuss emotions they experience before and after a relapse. They will process how experiencing these feelings, or avoidance of experiencing them, relates to having a relapse. Facilitator will guide patients to explore emotions they have related to recovery. Patients will be encouraged to process which emotions are more powerful. They will be guided to discuss the emotional reaction significant others in their lives may have to their relapse or recovery. Patients will be assisted in exploring ways to respond to the emotions of others without this contributing to a relapse.  Therapeutic Goals: 1. Patient will identify two or more emotions that lead to a relapse for them 2. Patient will identify two emotions that result when they relapse 3. Patient will identify two emotions related to recovery 4. Patient will demonstrate ability to communicate their needs through discussion and/or role plays   Summary of Patient Progress: Patient did not attend group despite encouraged participation.     Therapeutic Modalities:   Cognitive Behavioral Therapy Solution-Focused Therapy Assertiveness Training Relapse Prevention Therapy   Gwenevere Ghazi, MSW, [place], [place] 05/21/2020 12:09 PM

## 2020-05-21 NOTE — Progress Notes (Signed)
[facility] NOVEL CORONAVIRUS (COVID-19) DAILY CHECK-OFF SYMPTOMS - answer yes or no to each - every day NO YES  Have you had a fever in the past 24 hours?  . Fever (Temp > 37.80C / 100F) X   Have you had any of these symptoms in the past 24 hours? . New Cough .  Sore Throat  .  Shortness of Breath .  Difficulty Breathing .  Unexplained Body Aches   X   Have you had any one of these symptoms in the past 24 hours not related to allergies?   . Runny Nose .  Nasal Congestion .  Sneezing   X   If you have had runny nose, nasal congestion, sneezing in the past 24 hours, has it worsened?  X   EXPOSURES - check yes or no X   Have you traveled outside the state in the past 14 days?  X   Have you been in contact with someone with a confirmed diagnosis of COVID-19 or PUI in the past 14 days without wearing appropriate PPE?  X   Have you been living in the same home as a person with confirmed diagnosis of COVID-19 or a PUI (household contact)?    X   Have you been diagnosed with COVID-19?    X              What to do next: Answered NO to all: Answered YES to anything:   Proceed with unit schedule Follow the [facility] Inpatient Flowsheet.   

## 2020-05-21 NOTE — Progress Notes (Signed)
D: Spoke with patient briefly upon awakening during medication administration. Patient denies SI/HI at this time. Patient also denies AH/VH at this time. Patient contracts for safety. A: Provided positive reinforcement and encouragement.  R: Patient cooperative and receptive to efforts. Patient remains safe on the unit.   05/20/20 2121  Psych Admission Type (Psych Patients Only)  Admission Status Involuntary  Psychosocial Assessment  Patient Complaints Anxiety;Sadness  Eye Contact Fair  Facial Expression Other (Comment) (sleepy spoke with patient briefly upon awakening)  Affect Appropriate to circumstance  Speech Logical/coherent  Interaction Assertive  Motor Activity Fidgety  Appearance/Hygiene In scrubs  Behavior Characteristics Cooperative;Appropriate to situation  Mood Depressed;Sad  Thought Process  Coherency Unable to assess  Content UTA  Delusions UTA  Perception WDL  Hallucination None reported or observed  Judgment Poor  Confusion None  Danger to Self  Current suicidal ideation? Denies  Danger to Others  Danger to Others None reported or observed
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
## 2020-05-21 NOTE — [facility] Group Notes (Signed)
Adult Psychoeducational Group Note  Date:  05/21/2020 Time:  9:16 AM  Group Topic/Focus:  Orientation:   The focus of this group is to educate the patient on the purpose and policies of crisis stabilization and provide a format to answer questions about their admission.  The group details unit policies and expectations of patients while admitted.  Participation Level:  Active  Participation Quality:  Appropriate  Affect:  Appropriate  Cognitive:  Appropriate  Insight: Appropriate  Engagement in Group:  Engaged  Modes of Intervention:  Discussion  Additional Comments:  Patient did not attend morning group.   Willie Herman W Amiyrah Lamere 05/21/2020, 9:16 AM

## 2020-05-22 DIAGNOSIS — F1994 Other psychoactive substance use, unspecified with psychoactive substance-induced mood disorder: Secondary | ICD-10-CM | POA: Diagnosis not present

## 2020-05-22 LAB — GLUCOSE, CAPILLARY
Glucose-Capillary: 125 mg/dL — ABNORMAL HIGH (ref 70–99)
Glucose-Capillary: 82 mg/dL (ref 70–99)
Glucose-Capillary: 112 mg/dL — ABNORMAL HIGH (ref 70–99)

## 2020-05-22 MED ORDER — FLUOXETINE HCL 20 MG PO CAPS
20.0000 mg | ORAL_CAPSULE | Freq: Every day | ORAL | Status: DC
  Administered 2020-05-22 – 2020-05-23 (×2): 20 mg via ORAL
  Filled 2020-05-22 (×4): qty 1

## 2020-05-22 NOTE — Progress Notes (Addendum)
   05/22/20 1800  Psych Admission Type (Psych Patients Only)  Admission Status Involuntary  Psychosocial Assessment  Patient Complaints None  Eye Contact Fair  Facial Expression Sad  Affect Appropriate to circumstance  Speech Logical/coherent  Interaction Assertive  Motor Activity Other (Comment) (WDL)  Appearance/Hygiene Unremarkable  Behavior Characteristics Cooperative;Appropriate to situation  Mood Anxious  Thought Process  Coherency WDL  Content WDL  Delusions None reported or observed  Perception WDL  Hallucination None reported or observed  Judgment Poor  Confusion None  Danger to Self  Current suicidal ideation? Denies  Self-Injurious Behavior No self-injurious ideation or behavior indicators observed or expressed   Agreement Not to Harm Self Yes  Description of Agreement Verbal contract  Danger to Others  Danger to Others None reported or observed     D. Pt presents with an improving mood today- has been visible in the milieu throughout the shift. Per pt's self inventory, pt rated his depression, hopelessness and anxiety all 0's today. Pt reports that his goal today is "staying positive" by "not letting things get me down". Pt currently denies SI/HI and AVH   A. Labs and vitals monitored. Pt given and educated on medications. Pt supported emotionally and encouraged to express concerns and ask questions.   R. Pt remains safe with 15 minute checks. Will continue POC.

## 2020-05-22 NOTE — Progress Notes (Addendum)
D: Patient presents with sad affect but is pleasant at time of assessment. Patient reports feeling irritable at time of assessment and states he feels that the Wellbutrin is not a good medication for him. Patient states he took Celexa previously and he felt the medication was good for him. Patient became visibly upset after an argument on the telephone. Patient denies SI/HI at this time. Patient also denies AH/VH at this time. Patient contracts for safety.  A: Provided positive reinforcement and encouragement.  R: Patient cooperative and receptive to efforts. Patient remains safe on the unit.   05/21/20 2051  Psych Admission Type (Psych Patients Only)  Admission Status Involuntary  Psychosocial Assessment  Patient Complaints Depression;Sadness  Eye Contact Fair  Facial Expression Sad  Affect Appropriate to circumstance  Speech Logical/coherent  Interaction Assertive  Motor Activity Other (Comment) (WDL)  Appearance/Hygiene Unremarkable  Behavior Characteristics Cooperative;Appropriate to situation  Mood Depressed  Thought Process  Coherency WDL  Content WDL  Delusions None reported or observed  Perception WDL  Hallucination None reported or observed  Judgment Poor  Confusion None  Danger to Self  Current suicidal ideation? Denies  Danger to Others  Danger to Others None reported or observed

## 2020-05-22 NOTE — Progress Notes (Signed)
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  Objective: Patient is seen and examined.  Patient is a 45 year old male with the above-stated past psychiatric history who is seen in follow-up.  He stated today that he felt as though the Wellbutrin XL was making him feel worse, and wanted to stop it.  We discussed options.  We decided upon fluoxetine.  We will start him on 20 mg a day.  Hopefully he will tolerate this better.  He denied any suicidal or homicidal ideation.  He denied any psychotic symptoms.  He did state he was able to get in touch with his girlfriend, and she is going to allow him to be discharged back to stay at her home.  His vital signs are stable, he is afebrile.  He is mildly bradycardic with a rate of 55-60 today.  He slept 6.75 hours last night.  He is out of bed and going to groups today.  No new laboratories.  Principal Problem: <principal problem not specified> Diagnosis: Active Problems:   Substance induced mood disorder (HCC)  Total Time spent with patient: 20 minutes  Past Psychiatric History: See admission H&P  Past Medical History:  Past Medical History:  Diagnosis Date  . Anxiety   . COPD (chronic obstructive pulmonary disease) (HCC)   . Depression   . diet controlled diabetes    former  . GERD (gastroesophageal reflux disease)   . High cholesterol   . Hypertension   . Suicidal ideation     Past Surgical History:  Procedure Laterality Date  . ABDOMINAL SURGERY    . APPENDECTOMY    . LEG SURGERY     when 12. Patient reports he was in tractor accident and leg had to be repaired  . MENISCUS REPAIR    . VASCULAR SURGERY     Family History: History reviewed. No pertinent family  history. Family Psychiatric  History: See admission H&P Social History:  Social History   Substance and Sexual Activity  Alcohol Use Not Currently     Social History   Substance and Sexual Activity  Drug Use Yes  . Types: Methamphetamines   Comment: crystal meth x 1 week ago    Social History   Socioeconomic History  . Marital status: Significant Other    Spouse name: Not on file  . Number of children: Not on file  . Years of education: Not on file  . Highest education level: Not on file  Occupational History  . Not on file  Tobacco Use  . Smoking status: Current Every Day Smoker    Packs/day: 1.50    Types: Cigarettes  . Smokeless tobacco: Never Used  Vaping Use  . Vaping Use: Never used  Substance and Sexual Activity  . Alcohol use: Not Currently  . Drug use: Yes    Types: Methamphetamines    Comment: crystal meth x 1 week ago  . Sexual activity: Not Currently  Other Topics Concern  . Not on file  Social History Narrative  . Not on file   Social Determinants of Health   Financial Resource Strain: Not on file  Food Insecurity: Not on file  Transportation Needs: Not on file  Physical Activity: Not on file  Stress: Not on file  Social Connections: Not on file   Additional Social History:                         Sleep: Good  Appetite:  Good  Current Medications: Current Facility-Administered Medications  Medication Dose Route Frequency Provider Last Rate Last Admin  . acetaminophen (TYLENOL) tablet 650 mg  650 mg Oral Q6H PRN Aldean Baker, NP      . alum & mag hydroxide-simeth (MAALOX/MYLANTA) 200-200-20 MG/5ML suspension 30 mL  30 mL Oral Q4H PRN Aldean Baker, NP      . amLODipine (NORVASC) tablet 10 mg  10 mg Oral Daily Aldean Baker, NP   10 mg at 05/22/20 5456  . FLUoxetine (PROZAC) capsule 20 mg  20 mg Oral Daily Antonieta Pert, MD      . hydrOXYzine (ATARAX/VISTARIL) tablet 25 mg  25 mg Oral TID PRN Aldean Baker, NP   25 mg at  05/21/20 2051  . magnesium hydroxide (MILK OF MAGNESIA) suspension 30 mL  30 mL Oral Daily PRN Aldean Baker, NP      . metFORMIN (GLUCOPHAGE) tablet 500 mg  500 mg Oral Q breakfast Aldean Baker, NP   500 mg at 05/22/20 0804  . nicotine (NICODERM CQ - dosed in mg/24 hours) patch 21 mg  21 mg Transdermal Daily Aldean Baker, NP   21 mg at 05/22/20 2563  . pantoprazole (PROTONIX) EC tablet 40 mg  40 mg Oral Daily Aldean Baker, NP   40 mg at 05/22/20 0804  . sucralfate (CARAFATE) tablet 1 g  1 g Oral TID WC & HS Aldean Baker, NP   1 g at 05/22/20 1230  . traZODone (DESYREL) tablet 50 mg  50 mg Oral QHS PRN Aldean Baker, NP   50 mg at 05/21/20 2051    Lab Results:  Results for orders placed or performed during the hospital encounter of 05/19/20 (from the past 48 hour(s))  Glucose, capillary     Status: Abnormal   Collection Time: 05/20/20  4:58 PM  Result Value Ref Range   Glucose-Capillary 136 (H) 70 - 99 mg/dL    Comment: Glucose reference range applies only to samples taken after fasting for at least 8 hours.   Comment 1 Notify RN   Glucose, capillary     Status: Abnormal   Collection Time: 05/21/20  6:29 AM  Result Value Ref Range   Glucose-Capillary 141 (H) 70 - 99 mg/dL    Comment: Glucose reference range applies only to samples taken after fasting for at least 8 hours.   Comment 1 Notify RN    Comment 2 Document in Chart   Glucose, capillary     Status: Abnormal   Collection Time: 05/21/20  4:25 PM  Result Value Ref Range   Glucose-Capillary 117 (H) 70 - 99 mg/dL    Comment: Glucose reference range applies only to samples taken after fasting for at least 8 hours.  Glucose, capillary     Status: Abnormal   Collection Time: 05/22/20  6:18 AM  Result Value Ref Range   Glucose-Capillary 125 (H) 70 - 99 mg/dL    Comment: Glucose reference range applies only to samples taken after fasting for at least 8 hours.  Glucose, capillary     Status: None   Collection Time: 05/22/20  11:34 AM  Result Value Ref Range   Glucose-Capillary 82 70 - 99 mg/dL  Comment: Glucose reference range applies only to samples taken after fasting for at least 8 hours.    Blood Alcohol level:  Lab Results  Component Value Date   ETH <10 05/17/2020   ETH <10 05/06/2020    Metabolic Disorder Labs: Lab Results  Component Value Date   HGBA1C 6.8 (H) 05/20/2020   MPG 148.46 05/20/2020   MPG 139.85 05/08/2020   No results found for: PROLACTIN Lab Results  Component Value Date   CHOL 166 05/20/2020   TRIG 83 05/20/2020   HDL 34 (L) 05/20/2020   CHOLHDL 4.9 05/20/2020   VLDL 17 05/20/2020   LDLCALC 115 (H) 05/20/2020   LDLCALC 155 (H) 05/08/2020    Physical Findings: AIMS: Facial and Oral Movements Muscles of Facial Expression: None, normal Lips and Perioral Area: None, normal Jaw: None, normal Tongue: None, normal,Extremity Movements Upper (arms, wrists, hands, fingers): None, normal Lower (legs, knees, ankles, toes): None, normal, Trunk Movements Neck, shoulders, hips: None, normal, Overall Severity Severity of abnormal movements (highest score from questions above): None, normal Incapacitation due to abnormal movements: None, normal Patient's awareness of abnormal movements (rate only patient's report): No Awareness, Dental Status Current problems with teeth and/or dentures?: No Does patient usually wear dentures?: No  CIWA:    COWS:     Musculoskeletal: Strength & Muscle Tone: within normal limits Gait & Station: normal Patient leans: N/A  Psychiatric Specialty Exam: Physical Exam Vitals and nursing note reviewed.  Constitutional:      Appearance: Normal appearance.  HENT:     Head: Normocephalic and atraumatic.  Pulmonary:     Effort: Pulmonary effort is normal.  Neurological:     General: No focal deficit present.     Mental Status: He is alert and oriented to person, place, and time.     Review of Systems  Blood pressure 114/76, pulse 60,  temperature 97.8 F (36.6 C), temperature source Oral, resp. rate 18, height 5\' 11"  (1.803 m), weight 100 kg, SpO2 97 %.Body mass index is 30.75 kg/m.  General Appearance: Casual  Eye Contact:  Fair  Speech:  Normal Rate  Volume:  Normal  Mood:  Anxious  Affect:  Congruent  Thought Process:  Coherent and Descriptions of Associations: Intact  Orientation:  Full (Time, Place, and Person)  Thought Content:  Logical  Suicidal Thoughts:  No  Homicidal Thoughts:  No  Memory:  Immediate;   Fair Recent;   Fair Remote;   Fair  Judgement:  Intact  Insight:  Fair  Psychomotor Activity:  Normal  Concentration:  Concentration: Fair and Attention Span: Fair  Recall:  [employer]Fair  Fund of Knowledge:  Fair  Language:  Good  Akathisia:  Negative  Handed:  Right  AIMS (if indicated):     Assets:  Desire for Improvement Housing Resilience Social Support  ADL's:  Intact  Cognition:  WNL  Sleep:  Number of Hours: 6.75     Treatment Plan Summary: Daily contact with patient to assess and evaluate symptoms and progress in treatment, Medication management and Plan : Patient is seen and examined.  Patient is a 45 year old male with the above-stated past psychiatric history who is seen in follow-up.   Diagnosis: 1.  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 2.  Methamphetamine dependence 3.  Hypertension 4.  Type 2 diabetes mellitus 5.  History of significant gastric erosions  Pertinent findings on examination today: 1.  Patient requested to stop the Wellbutrin, this was done. 2.  Patient was started on  fluoxetine 20 mg p.o. daily for depression. 3.  His hygiene and everything else appears to be improving. 4.  Patient denies suicidal ideation. 5.  Diabetes is stable.  Plan: 1.  Continue Norvasc 10 mg p.o. daily for hypertension. 2.  Stop Wellbutrin XL. 3.  Start fluoxetine 20 mg p.o. daily for depression and anxiety. 4.  Continue hydroxyzine 25 mg p.o. 3 times daily as needed  anxiety. 5.  Continue Metformin 500 mg p.o. daily for diabetes mellitus type 2. 6.  Continue Protonix 40 mg p.o. daily for GERD. 7.  Continue Carafate 1 g p.o. 3 times daily for gastric erosions. 8.  Continue trazodone 50 mg p.o. nightly as needed insomnia. 9.  Disposition planning-in progress.  Antonieta Pert, MD 05/22/2020, 1:58 PM

## 2020-05-22 NOTE — [facility] Group Notes (Signed)
Psychoeducational Group Note    Date:05/22/2020 Time: 1300-1400    Life Skills:  A group where two lists are made. What people need and what are things that we do that are healthy. The lists are developed by the patients and it is explained that we often do the actions that are not healthy to get our list of needs met.   Purpose of Group: . The group focus' on teaching patients on how to identify their needs and how to develop the coping skills needed to get their needs met  Participation Level:  Active  Participation Quality:  Appropriate  Affect:  Appropriate  Cognitive:  Oriented  Insight:  Improving  Engagement in Group:  Engaged  Additional Comments:  Marland Kitchen..was active in the group and participated fully in the conversation.  Paulino Rily

## 2020-05-22 NOTE — [facility] Group Notes (Signed)
[facility] LCSW Group Therapy Note  Date/Time:  05/22/2020 9:00-10:00 or 10:00-11:00AM  Type of Therapy and Topic:  Group Therapy:  Healthy and Unhealthy Supports  Participation Level:  Active   Description of Group:  Patients in this group were introduced to the idea of adding a variety of healthy supports to address the various needs in their lives.Patients discussed what additional healthy supports could be helpful in their recovery and wellness after discharge in order to prevent future hospitalizations.   An emphasis was placed on using counselor, doctor, therapy groups, 12-step groups, and problem-specific support groups to expand supports.  They also worked as a group on developing a specific plan for several patients to deal with unhealthy supports through boundary-setting, psychoeducation with loved ones, and even termination of relationships.   Therapeutic Goals:   1)  discuss importance of adding supports to stay well once out of the hospital  2)  compare healthy versus unhealthy supports and identify some examples of each  3)  generate ideas and descriptions of healthy supports that can be added  4)  offer mutual support about how to address unhealthy supports  5)  encourage active participation in and adherence to discharge plan    Summary of Patient Progress:  The patient stated that current healthy supports in his life are negative associates while current unhealthy supports include friends  The patient expressed a willingness to add therapyas support(s) to help in his recovery journey.   Therapeutic Modalities:   Motivational Interviewing Brief Solution-Focused Therapy  Evorn Gong

## 2020-05-22 NOTE — [facility] Group Notes (Signed)
Adult Psychoeducational Group Not Date:  05/22/2020 Time:  5277-8242 Group Topic/Focus: PROGRESSIVE RELAXATION. A group where deep breathing is taught and tensing and relaxation muscle groups is used. Imagery is used as well.  Pts are asked to imagine 3 pillars that hold them up when they are not able to hold themselves up.  Participation Level:  Active  Participation Quality:  Appropriate  Affect:  Appropriate  Cognitive:  Oriented  Insight: Improving  Engagement in Group:  Engaged  Modes of Intervention:  Activity, Discussion, Education, and Support  Additional Comments:  Pt rates his energy at a 2. Was not able to come up with any things that hold him up.  Dione Housekeeper

## 2020-05-23 LAB — GLUCOSE, CAPILLARY: Glucose-Capillary: 119 mg/dL — ABNORMAL HIGH (ref 70–99)

## 2020-05-23 MED ORDER — FLUOXETINE HCL 20 MG PO CAPS: 20.0000 mg | ORAL_CAPSULE | Freq: Every day | ORAL | 0 refills | Status: DC

## 2020-05-23 MED ORDER — AMLODIPINE BESYLATE 10 MG PO TABS
10.0000 mg | ORAL_TABLET | Freq: Every day | ORAL | Status: DC
  Filled 2020-05-23: qty 7

## 2020-05-23 MED ORDER — AMLODIPINE BESYLATE 5 MG PO TABS: 5.0000 mg | ORAL_TABLET | Freq: Every day | ORAL | Status: DC
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
## 2020-05-23 NOTE — Progress Notes (Signed)
RN assessed edema to both ankles reported by patient. Non pitting. Will continue to assess and monitor.

## 2020-05-23 NOTE — Progress Notes (Signed)
  [facility] Adult Case Management Discharge Plan :  Will you be returning to the same living situation after discharge:  Yes,  Home At discharge, do you have transportation home?: Yes,  Girlfriend Do you have the ability to pay for your medications: Yes,  Employment   Release of information consent forms completed and in the chart;  Patient's signature needed at discharge.  Patient to Follow up at:  Follow-up Information    [facility]. Go on 05/25/2020.   Why: You have an appointment to establish care with this provider on 05/25/20 at 11:10 am.  This appointment will be held in person.  Please arrive 15 minutes prior to your appointment.  Contact information: [place] [address] [phone]       [facility] Follow up.   Why: You may go to this provider for pharmacy services. Contact information: [address] [place] [address] [phone]       [facility]. Go on 05/24/2020.   Why: You have a hospital follow up appointment for therapy and medication management services on 05/24/20 at11:00 am.  This appointment will be held in person.  Please bring your photo ID. Contact information: [address] [place] [address] [phone]               Next level of care provider has access to [facility]:yes  Safety Planning and Suicide Prevention discussed: Yes,  with patient  Have you used any form of tobacco in the last 30 days? (Cigarettes, Smokeless Tobacco, Cigars, and/or Pipes): Yes  Has patient been referred to the Quitline?: Patient refused referral  Patient has been referred for addiction treatment: Pt. refused referral  Aram Beecham, LCSWA 05/23/2020, 9:25 AM

## 2020-05-23 NOTE — Progress Notes (Signed)
Recreation Therapy Notes  Date:  2.28.22 Time: 0930 Location: [address] Dayroom  Group Topic: Stress Management  Goal Area(s) Addresses:  Patient will identify positive stress management techniques. Patient will identify benefits of using stress management post d/c.  Intervention: Stress Management  Activity:  Meditation.  LRT played a meditation from the Calm app that focused on making the most of your day by focusing on something you want to accomplish.  Patients were to listen and follow along meditation played to engage in activity.    Education:  Stress Management, Discharge Planning.   Education Outcome: Acknowledges Education  Clinical Observations/Feedback: Pt did not attend group activity.   Caroll Rancher, LRT/CTRS         Caroll Rancher A 05/23/2020 11:25 AM

## 2020-05-23 NOTE — Progress Notes (Signed)
D: Patient presents with pleasant affect. Patient reports his mood improved from the previous day. Patient denies SI/HI at this time. Patient also denies AH/VH at this time. Patient contracts for safety.  A: Provided positive reinforcement and encouragement.  R: Patient cooperative and receptive to efforts. Patient remains safe on the unit.  05/22/20 2105  Psych Admission Type (Psych Patients Only)  Admission Status Involuntary  Psychosocial Assessment  Patient Complaints None  Eye Contact Fair  Facial Expression Other (Comment) (appropriate)  Affect Appropriate to circumstance  Speech Logical/coherent  Interaction Assertive  Motor Activity Other (Comment) (WDL)  Appearance/Hygiene Unremarkable  Behavior Characteristics Appropriate to situation;Cooperative  Mood Pleasant  Thought Process  Coherency WDL  Content WDL  Delusions None reported or observed  Perception WDL  Hallucination None reported or observed  Judgment Poor  Confusion None  Danger to Self  Current suicidal ideation? Denies  Self-Injurious Behavior No self-injurious ideation or behavior indicators observed or expressed   Agreement Not to Harm Self Yes

## 2020-05-23 NOTE — [facility]HH Suicide Risk Assessment (Signed)
[facility] Discharge Suicide Risk Assessment   Principal Problem: <principal problem not specified> Discharge Diagnoses: Active Problems:   Substance induced mood disorder (HCC)   Total Time spent with patient: 20 minutes  Musculoskeletal: Strength & Muscle Tone: within normal limits Gait & Station: normal Patient leans: N/A  Psychiatric Specialty Exam: Review of Systems  All other systems reviewed and are negative.   Blood pressure 117/76, pulse 60, temperature 97.6 F (36.4 C), temperature source Oral, resp. rate 18, height 5\' 11"  (1.803 m), weight 100 kg, SpO2 97 %.Body mass index is 30.75 kg/m.  General Appearance: Casual  Eye Contact::  Good  Speech:  Normal Rate409  Volume:  Normal  Mood:  Euthymic  Affect:  Congruent  Thought Process:  Coherent and Descriptions of Associations: Intact  Orientation:  Full (Time, Place, and Person)  Thought Content:  Logical  Suicidal Thoughts:  No  Homicidal Thoughts:  No  Memory:  Immediate;   Good Recent;   Good Remote;   Good  Judgement:  Intact  Insight:  Fair  Psychomotor Activity:  Normal  Concentration:  Fair  Recall:  Good  Fund of Knowledge:Good  Language: Good  Akathisia:  Negative  Handed:  Right  AIMS (if indicated):     Assets:  Desire for Improvement Housing Resilience  Sleep:  Number of Hours: 6.75  Cognition: WNL  ADL's:  Intact   Mental Status Per Nursing Assessment::   On Admission:  Suicidal ideation indicated by patient  Demographic Factors:  Male, Caucasian, Low socioeconomic status and Unemployed  Loss Factors: Loss of significant relationship and Financial problems/change in socioeconomic status  Historical Factors: Impulsivity  Risk Reduction Factors:   Living with another person, especially a relative and Positive social support  Continued Clinical Symptoms:  Depression:   Comorbid alcohol abuse/dependence Impulsivity Alcohol/Substance Abuse/Dependencies  Cognitive Features That  Contribute To Risk:  None    Suicide Risk:  Minimal: No identifiable suicidal ideation.  Patients presenting with no risk factors but with morbid ruminations; may be classified as minimal risk based on the severity of the depressive symptoms   Follow-up Information    [facility]. Go on 05/25/2020.   Why: You have an appointment to establish care with this provider on 05/25/20 at 11:10 am.  This appointment will be held in person.  Please arrive 15 minutes prior to your appointment.  Contact information: 07[address]25[address] [place] [phone]       [facility] Follow up.   Why: You may go to this provider for pharmacy services. Contact information: [address] [phone] [facility] [phone]       [facility]. Go on 05/24/2020.   Why: You have a hospital follow up appointment for therapy and medication management services on 05/24/20 at11:00 am.  This appointment will be held in person.  Please bring your photo ID. Contact information: [address] [place] [phone]               Plan Of Care/Follow-up recommendations:  Activity:  ad lib  [phone], MD 05/23/2020, 9:20 AM

## 2020-05-23 NOTE — Progress Notes (Signed)
Discharge Note:  Patient denies SI/HI AVH at this time. Discharge instructions, AVS, prescriptions and transition record gone over with patient. Patient agrees to comply with medication management, follow-up visit, and outpatient therapy. Patient belongings returned to patient. Patient questions and concerns addressed and answered.  Patient ambulatory off unit.  Patient discharged to home with friend.   

## 2020-05-25 ENCOUNTER — Inpatient Hospital Stay (INDEPENDENT_AMBULATORY_CARE_PROVIDER_SITE_OTHER): Payer: Self-pay | Admitting: Primary Care

## 2020-05-29 ENCOUNTER — Other Ambulatory Visit: Payer: Self-pay

## 2020-05-29 ENCOUNTER — Emergency Department (HOSPITAL_COMMUNITY)
Admission: EM | Admit: 2020-05-29 | Discharge: 2020-05-31 | Disposition: A | Discharge status: Home / Self Care | Payer: Self-pay | Attending: Emergency Medicine | Admitting: Emergency Medicine

## 2020-05-29 ENCOUNTER — Encounter (HOSPITAL_COMMUNITY): Payer: Self-pay

## 2020-05-29 DIAGNOSIS — F1510 Other stimulant abuse, uncomplicated: Secondary | ICD-10-CM | POA: Insufficient documentation

## 2020-05-29 DIAGNOSIS — I10 Essential (primary) hypertension: Secondary | ICD-10-CM | POA: Insufficient documentation

## 2020-05-29 DIAGNOSIS — Z79899 Other long term (current) drug therapy: Secondary | ICD-10-CM | POA: Insufficient documentation

## 2020-05-29 DIAGNOSIS — F17210 Nicotine dependence, cigarettes, uncomplicated: Secondary | ICD-10-CM | POA: Insufficient documentation

## 2020-05-29 DIAGNOSIS — F1994 Other psychoactive substance use, unspecified with psychoactive substance-induced mood disorder: Secondary | ICD-10-CM | POA: Diagnosis present

## 2020-05-29 DIAGNOSIS — R39198 Other difficulties with micturition: Secondary | ICD-10-CM | POA: Insufficient documentation

## 2020-05-29 DIAGNOSIS — R45851 Suicidal ideations: Secondary | ICD-10-CM | POA: Insufficient documentation

## 2020-05-29 DIAGNOSIS — J449 Chronic obstructive pulmonary disease, unspecified: Secondary | ICD-10-CM | POA: Insufficient documentation

## 2020-05-29 DIAGNOSIS — M791 Myalgia, unspecified site: Secondary | ICD-10-CM | POA: Insufficient documentation

## 2020-05-29 DIAGNOSIS — F1924 Other psychoactive substance dependence with psychoactive substance-induced mood disorder: Secondary | ICD-10-CM | POA: Insufficient documentation

## 2020-05-29 DIAGNOSIS — Z20822 Contact with and (suspected) exposure to covid-19: Secondary | ICD-10-CM | POA: Insufficient documentation

## 2020-05-29 LAB — URINALYSIS, ROUTINE W REFLEX MICROSCOPIC
Bilirubin Urine: NEGATIVE
Glucose, UA: 50 mg/dL — AB
Hgb urine dipstick: NEGATIVE
Ketones, ur: NEGATIVE mg/dL
Leukocytes,Ua: NEGATIVE
Nitrite: NEGATIVE
Protein, ur: NEGATIVE mg/dL
Specific Gravity, Urine: 1.003 — ABNORMAL LOW (ref 1.005–1.030)
pH: 6 (ref 5.0–8.0)

## 2020-05-29 LAB — ETHANOL: Alcohol, Ethyl (B): <10 mg/dL (ref <10)

## 2020-05-29 LAB — COMPREHENSIVE METABOLIC PANEL
ALT: 26 U/L (ref 0–44)
AST: 23 U/L (ref 15–41)
Albumin: 3.8 g/dL (ref 3.5–5.0)
Alkaline Phosphatase: 42 U/L (ref 38–126)
Anion gap: 8 (ref 5–15)
BUN: 7 mg/dL (ref 6–20)
CO2: 24 mmol/L (ref 22–32)
Calcium: 8.2 mg/dL — ABNORMAL LOW (ref 8.9–10.3)
Chloride: 102 mmol/L (ref 98–111)
Creatinine, Ser: 0.71 mg/dL (ref 0.61–1.24)
GFR, Estimated: >60 mL/min (ref >60)
Glucose, Bld: 213 mg/dL — ABNORMAL HIGH (ref 70–99)
Potassium: 3.8 mmol/L (ref 3.5–5.1)
Sodium: 134 mmol/L — ABNORMAL LOW (ref 135–145)
Total Bilirubin: 0.6 mg/dL (ref 0.3–1.2)
Total Protein: 6.1 g/dL — ABNORMAL LOW (ref 6.5–8.1)

## 2020-05-29 LAB — RAPID URINE DRUG SCREEN, HOSP PERFORMED
Amphetamines: NOT DETECTED
Barbiturates: NOT DETECTED
Benzodiazepines: NOT DETECTED
Cocaine: NOT DETECTED
Opiates: NOT DETECTED
Tetrahydrocannabinol: NOT DETECTED

## 2020-05-29 LAB — CBC WITH DIFFERENTIAL/PLATELET
Abs Immature Granulocytes: 0.03 10*3/uL (ref 0.00–0.07)
Basophils Absolute: 0 10*3/uL (ref 0.0–0.1)
Basophils Relative: 1 %
Eosinophils Absolute: 0.2 10*3/uL (ref 0.0–0.5)
Eosinophils Relative: 2 %
HCT: 36.7 % — ABNORMAL LOW (ref 39.0–52.0)
Hemoglobin: 12.9 g/dL — ABNORMAL LOW (ref 13.0–17.0)
Immature Granulocytes: 1 %
Lymphocytes Relative: 29 %
Lymphs Abs: 1.9 10*3/uL (ref 0.7–4.0)
MCH: 31 pg (ref 26.0–34.0)
MCHC: 35.1 g/dL (ref 30.0–36.0)
MCV: 88.2 fL (ref 80.0–100.0)
Monocytes Absolute: 0.5 10*3/uL (ref 0.1–1.0)
Monocytes Relative: 7 %
Neutro Abs: 4 10*3/uL (ref 1.7–7.7)
Neutrophils Relative %: 60 %
Platelets: 263 10*3/uL (ref 150–400)
RBC: 4.16 MIL/uL — ABNORMAL LOW (ref 4.22–5.81)
RDW: 12.4 % (ref 11.5–15.5)
WBC: 6.6 10*3/uL (ref 4.0–10.5)
nRBC: 0 % (ref 0.0–0.2)

## 2020-05-29 LAB — ACETAMINOPHEN LEVEL: Acetaminophen (Tylenol), Serum: <10 ug/mL — ABNORMAL LOW (ref 10–30)

## 2020-05-29 LAB — SALICYLATE LEVEL: Salicylate Lvl: <7 mg/dL — ABNORMAL LOW (ref 7.0–30.0)

## 2020-05-29 LAB — LIPASE, BLOOD: Lipase: 28 U/L (ref 11–51)

## 2020-05-29 LAB — CK: Total CK: 508 U/L — ABNORMAL HIGH (ref 49–397)

## 2020-05-29 MED ORDER — SODIUM CHLORIDE 0.9 % IV BOLUS
1000.0000 mL | Freq: Once | INTRAVENOUS | Status: AC
  Administered 2020-05-29: 1000 mL via INTRAVENOUS

## 2020-05-29 MED ORDER — AMLODIPINE BESYLATE 5 MG PO TABS
10.0000 mg | ORAL_TABLET | Freq: Every day | ORAL | Status: DC
  Administered 2020-05-29 – 2020-05-31 (×3): 10 mg via ORAL
  Filled 2020-05-29 (×3): qty 2

## 2020-05-29 MED ORDER — FLUOXETINE HCL 20 MG PO CAPS
20.0000 mg | ORAL_CAPSULE | Freq: Every day | ORAL | Status: DC
  Administered 2020-05-29 – 2020-05-31 (×3): 20 mg via ORAL
  Filled 2020-05-29 (×3): qty 1

## 2020-05-29 MED ORDER — METFORMIN HCL 500 MG PO TABS
500.0000 mg | ORAL_TABLET | Freq: Every day | ORAL | Status: DC
  Administered 2020-05-30 – 2020-05-31 (×2): 500 mg via ORAL
  Filled 2020-05-29 (×2): qty 1

## 2020-05-29 MED ORDER — PANTOPRAZOLE SODIUM 40 MG PO TBEC
40.0000 mg | DELAYED_RELEASE_TABLET | Freq: Every day | ORAL | Status: DC
  Administered 2020-05-29 – 2020-05-31 (×3): 40 mg via ORAL
  Filled 2020-05-29 (×3): qty 1

## 2020-05-29 NOTE — BH Assessment (Signed)
Tele Assessment Note   Patient Name: Willie Herman MRN: [id number] Referring Physician: EDP Location of Patient: [facility] Location of Provider: [facility] Department  Willie Herman is an 45 y.o. male who presented to the ED with physical complaint and report of suicidal ideation (currently without plan or intent) and substance use.  Diagnosis: Substance induced mood disorder Determination need:  Urgent Disposition:  Evaluation in AM  NARRATIVE:  Pt is a 45 year old male who presented to [facility] on a voluntary basis with complaint of despondency, suicidal ideation (without plan or intent), recent substance use, and other symptoms.  Pt was last assessed by Willie Herman on 05/18/2020, and he was treated for four days at [facility] for suicidal ideation, despondency, and meth use.  Pt lives in [place] with his girlfriend, and he is unemployed.  He stated that he has not followed up with a psychiatrist since his discharge from [facility].  Pt stated that he felt good when he was discharged from [facility], but since coming home, he has started to experience the following symptoms:  Despondency, insomnia, hopelessness and worthlessness, and suicidal ideation (currently without plan or intent).  Pt reported also that earlier this week, he used meth on two occasions.  Last use was 05/27/2020.  ''I am tired of using this drug.  I need help getting over it.''  Pt linked substance use to suicidal ideation.  Pt endorsed at least one previous suicide attempt.  Currently has no plan or intent.  Pt denied hallucination, homicidal ideation, and self-injurious behavior.  When asked what help he wanted, Pt responded that he wanted help in stopping use of meth.  During assessment, pt presented alert and oriented.  He had good eye contact and was cooperative.  Pt was appropriately groomed.  Pt's mood was depressed and hopeless, and affect was blunted.  Speech was normal in rate, rhythm, and volume.  Thought processes were within normal range, and  thought content was logical and goal-oriented.  There was no evidence of delusion.  Memory and concentration were intact.  Insight, judgment, and impulse control were fair.  Consulted with Willie Knock, NP, who determined that Pt is to remain in ED, stabilize, evaluation in AM for detox/rehab services and to encourage his follow-up patient provider.  Past Medical History:  Past Medical History:  Diagnosis Date  . Anxiety   . COPD (chronic obstructive pulmonary disease) (HCC)   . Depression   . diet controlled diabetes    former  . GERD (gastroesophageal reflux disease)   . High cholesterol   . Hypertension   . Suicidal ideation     Past Surgical History:  Procedure Laterality Date  . ABDOMINAL SURGERY    . APPENDECTOMY    . LEG SURGERY     when 12. Patient reports he was in tractor accident and leg had to be repaired  . MENISCUS REPAIR    . VASCULAR SURGERY      Family History: History reviewed. No pertinent family history.  Social History:  reports that he has been smoking cigarettes. He has been smoking about 1.50 packs per day. He has never used smokeless tobacco. He reports previous alcohol use. He reports current drug use. Drug: Methamphetamines.  Additional Social History:  Alcohol / Drug Use Pain Medications: Please see MAR Prescriptions: Please see MAR Over the Counter: Please see MAR History of alcohol / drug use?: Yes Longest period of sobriety (when/how long): 2 weeks Negative Consequences of Use: Personal relationships,Work / School  Withdrawal Symptoms: Blackouts,Agitation Substance #1 Name of Substance 1: Methamphetamines 1 - Age of First Use: 42 1 - Amount (size/oz): Approximately $40 worth 1 - Frequency: Several days a week 1 - Duration: 1.5 years 1 - Last Use / Amount: 05/27/2020 1 - Method of Aquiring: Street purchase  CIWA: CIWA-Ar BP: (!) 133/94 Pulse Rate: (!) 59 COWS:    Allergies:  Allergies  Allergen Reactions  . Pineapple Shortness Of Breath  and Rash    Home Medications: (Not in a hospital admission)   OB/GYN Status:  No LMP for male patient.  General Assessment Data Location of Assessment: [facility] Date Telepsych consult ordered in [facility]: 05/29/20 Marital status: Long term relationship                                                 Advance Directives (For Healthcare) Does Patient Have a Medical Advance Directive?: No          Disposition:     This service was provided via telemedicine using a 2-way, interactive audio and video technology.  Names of all persons participating in this telemedicine service and their role in this encounter. Name: Barlow Harrison. Willie Herman Role: Patient             Willie Herman 05/29/2020 4:19 PM

## 2020-05-29 NOTE — ED Triage Notes (Signed)
Pt to er, pt states that he is here because he is having a return of a medical condition that gives him swollen joints, states that he is also feeling depressed and having suicidal thoughts.  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  Denies hallucinations.

## 2020-05-29 NOTE — ED Provider Notes (Signed)
[facility] EMERGENCY DEPARTMENT Provider Note   CSN: [id number] Arrival date & time: 05/29/20  1244     History Chief Complaint  Patient presents with  . Suicidal    Currently without plan or intent; linked to substance use    Willie Herman is a 45 y.o. male.  HPI   45 year old male with a history of anxiety, COPD, depression, diabetes, GERD, hyperlipidemia, hypertension, suicidal thoughts, who presents to the emergency department today for evaluation of suicidal ideations and generalized body aches.  States that he has had rhabdo several times in the past and he feels like he is having another episode of this.  He has achiness all over his body and had some swelling in his feet this morning.  He also notes that his urine is dark and he has had some difficulty with urination.  He states that when he has had this in the past that is due to his meth use.  He has been using recently.  Additionally he reports he has been feeling suicidal for the last 6 months and it has just gotten worse recently.  He denies any specific plan for harming himself.  Denies HI or AVH.  Denies alcohol or other drug use.  Past Medical History:  Diagnosis Date  . Anxiety   . COPD (chronic obstructive pulmonary disease) ([place])   . Depression   . diet controlled diabetes    former  . GERD (gastroesophageal reflux disease)   . High cholesterol   . Hypertension   . Suicidal ideation     Patient Active Problem List   Diagnosis Date Noted  . Suicidal ideation 05/18/2020  . Hyponatremia 05/18/2020  . Hypokalemia 05/18/2020  . Substance induced mood disorder ([place])   . Nicotine abuse 05/10/2020  . Stimulant abuse ([place]) 05/09/2020  . MDD (major depressive disorder) 05/07/2020  . Rhabdomyolysis 10/21/2019    Past Surgical History:  Procedure Laterality Date  . ABDOMINAL SURGERY    . APPENDECTOMY    . LEG SURGERY     when 12. Patient reports he was in tractor accident and leg had to be repaired  . MENISCUS  REPAIR    . VASCULAR SURGERY         History reviewed. No pertinent family history.  Social History   Tobacco Use  . Smoking status: Current Every Day Smoker    Packs/day: 1.50    Types: Cigarettes  . Smokeless tobacco: Never Used  Vaping Use  . Vaping Use: Never used  Substance Use Topics  . Alcohol use: Not Currently  . Drug use: Yes    Types: Methamphetamines    Comment: crystal meth x 1 week ago    Home Medications Prior to Admission medications   Medication Sig Start Date End Date Taking? Authorizing Provider  amLODipine (NORVASC) 10 MG tablet Take 1 tablet (10 mg total) by mouth daily. 05/15/20  Yes Ethelene Hal, NP  Aspirin-Salicylamide-Caffeine [facility] HEADACHE POWDER PO) Take 1 Package by mouth daily as needed (headache).   Yes [provider]  FLUoxetine (PROZAC) 20 MG capsule Take 1 capsule (20 mg total) by mouth daily. 05/24/20  Yes Ethelene Hal, NP  metFORMIN (GLUCOPHAGE) 500 MG tablet Take 1 tablet (500 mg total) by mouth daily with breakfast. 05/15/20  Yes Ethelene Hal, NP  pantoprazole (PROTONIX) 40 MG tablet Take 1 tablet (40 mg total) by mouth daily. 05/14/20 06/13/20 Yes Ethelene Hal, NP  blood glucose meter kit and supplies KIT  Dispense based on patient and insurance preference. Use up to four times daily as directed. (FOR ICD-9 250.00, 250.01). 05/14/20   Ethelene Hal, NP  sucralfate (CARAFATE) 1 GM/10ML suspension Take 10 mLs (1 g total) by mouth 4 (four) times daily -  with meals and at bedtime. Patient not taking: Reported on 05/29/2020 05/14/20   Ethelene Hal, NP    Allergies    Pineapple  Review of Systems   Review of Systems  Constitutional: Negative for chills and fever.  HENT: Negative for ear pain and sore throat.   Eyes: Negative for visual disturbance.  Respiratory: Negative for cough and shortness of breath.   Cardiovascular: Positive for leg swelling. Negative for chest pain.   Gastrointestinal: Negative for abdominal pain, constipation, diarrhea, nausea and vomiting.  Genitourinary: Positive for difficulty urinating. Negative for dysuria and hematuria.  Musculoskeletal: Positive for myalgias.  Skin: Negative for rash.  Neurological: Negative for headaches.  Psychiatric/Behavioral: Positive for dysphoric mood and suicidal ideas. Negative for hallucinations.  All other systems reviewed and are negative.   Physical Exam Updated Vital Signs BP (!) 133/94 (BP Location: Right Arm)   Pulse (!) 59   Temp 98.1 F (36.7 C) (Oral)   Resp 18   Ht _0  (1.803 m)   Wt 97.5 kg   SpO2 99%   BMI 29.99 kg/m   Physical Exam Vitals and nursing note reviewed.  Constitutional:      Appearance: He is well-developed and well-nourished.  HENT:     Head: Normocephalic and atraumatic.  Eyes:     Conjunctiva/sclera: Conjunctivae normal.  Cardiovascular:     Rate and Rhythm: Normal rate and regular rhythm.     Heart sounds: Normal heart sounds. No murmur heard.   Pulmonary:     Effort: Pulmonary effort is normal. No respiratory distress.     Breath sounds: Normal breath sounds. No wheezing, rhonchi or rales.  Abdominal:     General: Bowel sounds are normal.     Palpations: Abdomen is soft.     Tenderness: There is no abdominal tenderness. There is no guarding or rebound.  Musculoskeletal:        General: No edema.     Cervical back: Neck supple.  Skin:    General: Skin is warm and dry.  Neurological:     Mental Status: He is alert.  Psychiatric:        Mood and Affect: Mood and affect normal.     ED Results / Procedures / Treatments   Labs (all labs ordered are listed, but only abnormal results are displayed) Labs Reviewed  COMPREHENSIVE METABOLIC PANEL - Abnormal; Notable for the following components:      Result Value   Sodium 134 (*)    Glucose, Bld 213 (*)    Calcium 8.2 (*)    Total Protein 6.1 (*)    All other components within normal limits   CBC WITH DIFFERENTIAL/PLATELET - Abnormal; Notable for the following components:   RBC 4.16 (*)    Hemoglobin 12.9 (*)    HCT 36.7 (*)    All other components within normal limits  ACETAMINOPHEN LEVEL - Abnormal; Notable for the following components:   Acetaminophen (Tylenol), Serum <10 (*)    All other components within normal limits  SALICYLATE LEVEL - Abnormal; Notable for the following components:   Salicylate Lvl <4.0 (*)    All other components within normal limits  URINALYSIS, ROUTINE W REFLEX MICROSCOPIC - Abnormal; Notable for  the following components:   Color, Urine STRAW (*)    Specific Gravity, Urine 1.003 (*)    Glucose, UA 50 (*)    All other components within normal limits  CK - Abnormal; Notable for the following components:   Total CK 508 (*)    All other components within normal limits  LIPASE, BLOOD  ETHANOL  RAPID URINE DRUG SCREEN, HOSP PERFORMED    EKG None  Radiology No results found.  Procedures Procedures   Medications Ordered in ED Medications  amLODipine (NORVASC) tablet 10 mg (10 mg Oral Given 05/29/20 1745)  FLUoxetine (PROZAC) capsule 20 mg (20 mg Oral Given 05/29/20 1746)  metFORMIN (GLUCOPHAGE) tablet 500 mg (has no administration in time range)  pantoprazole (PROTONIX) EC tablet 40 mg (40 mg Oral Given 05/29/20 1746)  sodium chloride 0.9 % bolus 1,000 mL (0 mLs Intravenous Stopped 05/29/20 1620)  sodium chloride 0.9 % bolus 1,000 mL (0 mLs Intravenous Stopped 05/29/20 1621)    ED Course  I have reviewed the triage vital signs and the nursing notes.  Pertinent labs & imaging results that were available during my care of the patient were reviewed by me and considered in my medical decision making (see chart for details).    MDM Rules/Calculators/A&P                          45 year old male presenting for evaluation of suicidal ideations.  Also complaining of diffuse body aches that feels similar to prior episodes of  rhabdomyolysis.  Reviewed/interpreted labs CBC with mild anemia, otherwise reassuring CMP with mild hyponatremia, elevated glucose, otherwise reassuring Lipase wnl CK slightly elevated  - does not meet criteria for admission for rhabdo but did initiate hydration with IVF ETOH neg Acetaminophen neg Salicylate neg UDS neg UA neg  Pt w/u reassuring. He is appropriate for TTS eval  Pt evaluated by [facility]. They recommend overnight obs with AM eval. The patient has been placed in psychiatric observation due to the need to provide a safe environment for the patient while obtaining psychiatric consultation and evaluation, as well as ongoing medical and medication management to treat the patient's condition.  The patient has not been placed under full IVC at this time.   Final Clinical Impression(s) / ED Diagnoses Final diagnoses:  Suicidal thoughts  Methamphetamine use [facility])    Rx / DC Orders ED Discharge Orders    None       Rodney Booze, PA-C 05/29/20 1758    Pattricia Boss, MD 05/30/20 1025

## 2020-05-29 NOTE — ED Notes (Signed)
Pt given food tray.

## 2020-05-30 DIAGNOSIS — F1994 Other psychoactive substance use, unspecified with psychoactive substance-induced mood disorder: Secondary | ICD-10-CM | POA: Diagnosis not present

## 2020-05-30 NOTE — ED Notes (Signed)
Telesitter set up in pts room and report called.

## 2020-05-30 NOTE — Consult Note (Signed)
Telepsych Consultation   Location of Patient: [facility] Location of Provider: [facility]  Patient Identification: Willie Herman MRN:  [id number] Principal Diagnosis: Substance induced mood disorder ([place]) Diagnosis:  Principal Problem:   Substance induced mood disorder ([place])   Total Time spent with patient: 20 minutes  HPI:  Reassessment: Patient seen via telepsych. Chart reviewed. Willie Herman is a 45 year old male with history of methamphetamine dependence and substance-induced mood disorder who presented to [facility] yesterday with SI. He has history of 2 recent admissions to [facility], most recently discharged 05/23/20.  On assessment today, patient appears depressed, minimally engaged, and continuing to report SI- "as soon as I get home, I can't stay away from the drugs." He reports meth use x2 since discharge from [facility] last week, with last use 3 days ago. Denies other drug/ETOH use. He reports compliance with medications from discharge but states he did not go to follow up appointments last week due to not having transportation. He reports feeling stressed, worthless, and wanting to die. We discussed substance use as trigger for his continued symptoms. Patient states he has never been to rehab before, which I am recommending. I consulted with CSW for potential rehab referral, but per CSW there are no available rehabs in patient's county with no insurance. Patient continues to report SI, states he still feels unstable similar to how he felt before his recent suicide attempt by overdose, and not contracting for safety.  Per TTS assessment: Willie Herman is a 45 year old male who presented to [facility] on a voluntary basis with complaint of despondency, suicidal ideation (without plan or intent), recent substance use, and other symptoms.  Willie Herman was last assessed by TTS on 05/18/2020, and he was treated for four days at [facility] for suicidal ideation, despondency, and meth use.  Willie Herman lives in [place] with his girlfriend, and he is  unemployed.  He stated that he has not followed up with a psychiatrist since his discharge from [facility].  Willie Herman stated that he felt good when he was discharged from [facility], but since coming home, he has started to experience the following symptoms:  Despondency, insomnia, hopelessness and worthlessness, and suicidal ideation (currently without plan or intent).  Willie Herman reported also that earlier this week, he used meth on two occasions.  Last use was 05/27/2020.  ''I am tired of using this drug.  I need help getting over it.''  Willie Herman linked substance use to suicidal ideation.  Willie Herman endorsed at least one previous suicide attempt.  Currently has no plan or intent.  Willie Herman denied hallucination, homicidal ideation, and self-injurious behavior.  When asked what help he wanted, Willie Herman responded that he wanted help in stopping use of meth.  During assessment, Willie Herman presented alert and oriented.  He had good eye contact and was cooperative.  Willie Herman was appropriately groomed.  Willie Herman's mood was depressed and hopeless, and affect was blunted.  Speech was normal in rate, rhythm, and volume.  Thought processes were within normal range, and thought content was logical and goal-oriented.  There was no evidence of delusion.  Memory and concentration were intact.  Insight, judgment, and impulse control were fair.  Disposition: Recommend inpatient treatment. CSW notified for bed placement. ED staff updated. Home medications already started.  Past Psychiatric History: See above  Risk to Self:   Risk to Others:   Prior Inpatient Therapy:   Prior Outpatient Therapy:    Past Medical History:  Past Medical History:  Diagnosis Date  . Anxiety   .  COPD (chronic obstructive pulmonary disease) ([place])   . Depression   . diet controlled diabetes    former  . GERD (gastroesophageal reflux disease)   . High cholesterol   . Hypertension   . Suicidal ideation     Past Surgical History:  Procedure Laterality Date  . ABDOMINAL SURGERY    . APPENDECTOMY    .  LEG SURGERY     when 12. Patient reports he was in tractor accident and leg had to be repaired  . MENISCUS REPAIR    . VASCULAR SURGERY     Family History: History reviewed. No pertinent family history. Family Psychiatric  History: unknown Social History:  Social History   Substance and Sexual Activity  Alcohol Use Not Currently     Social History   Substance and Sexual Activity  Drug Use Yes  . Types: Methamphetamines   Comment: crystal meth x 1 week ago    Social History   Socioeconomic History  . Marital status: Divorced    Spouse name: Not on file  . Number of children: Not on file  . Years of education: Not on file  . Highest education level: Not on file  Occupational History  . Not on file  Tobacco Use  . Smoking status: Current Every Day Smoker    Packs/day: 1.50    Types: Cigarettes  . Smokeless tobacco: Never Used  Vaping Use  . Vaping Use: Never used  Substance and Sexual Activity  . Alcohol use: Not Currently  . Drug use: Yes    Types: Methamphetamines    Comment: crystal meth x 1 week ago  . Sexual activity: Not Currently  Other Topics Concern  . Not on file  Social History Narrative  . Not on file   Social Determinants of Health   Financial Resource Strain: Not on file  Food Insecurity: Not on file  Transportation Needs: Not on file  Physical Activity: Not on file  Stress: Not on file  Social Connections: Not on file   Additional Social History:    Allergies:   Allergies  Allergen Reactions  . Pineapple Shortness Of Breath and Rash    Labs:  Results for orders placed or performed during the hospital encounter of 05/29/20 (from the past 48 hour(s))  Comprehensive metabolic panel     Status: Abnormal   Collection Time: 05/29/20  1:48 PM  Result Value Ref Range   Sodium 134 (L) 135 - 145 mmol/L   Potassium 3.8 3.5 - 5.1 mmol/L   Chloride 102 98 - 111 mmol/L   CO2 24 22 - 32 mmol/L   Glucose, Bld 213 (H) 70 - 99 mg/dL    Comment:  Glucose reference range applies only to samples taken after fasting for at least 8 hours.   BUN 7 6 - 20 mg/dL   Creatinine, Ser 0.71 0.61 - 1.24 mg/dL   Calcium 8.2 (L) 8.9 - 10.3 mg/dL   Total Protein 6.1 (L) 6.5 - 8.1 g/dL   Albumin 3.8 3.5 - 5.0 g/dL   AST 23 15 - 41 U/L   ALT 26 0 - 44 U/L   Alkaline Phosphatase 42 38 - 126 U/L   Total Bilirubin 0.6 0.3 - 1.2 mg/dL   GFR, Estimated >60 >60 mL/min    Comment: (NOTE) Calculated using the CKD-EPI Creatinine Equation (2021)    Anion gap 8 5 - 15    Comment: Performed at [facility], [address], [place], [place] [address]  CBC  with Differential     Status: Abnormal   Collection Time: 05/29/20  1:48 PM  Result Value Ref Range   WBC 6.6 4.0 - 10.5 K/uL   RBC 4.16 (L) 4.22 - 5.81 MIL/uL   Hemoglobin 12.9 (L) 13.0 - 17.0 g/dL   HCT 97.1 (L) 02.4 - 68.3 %   MCV 88.2 80.0 - 100.0 fL   MCH 31.0 26.0 - 34.0 pg   MCHC 35.1 30.0 - 36.0 g/dL   RDW 88.3 03.2 - 19.3 %   Platelets 263 150 - 400 K/uL   nRBC 0.0 0.0 - 0.2 %   Neutrophils Relative % 60 %   Neutro Abs 4.0 1.7 - 7.7 K/uL   Lymphocytes Relative 29 %   Lymphs Abs 1.9 0.7 - 4.0 K/uL   Monocytes Relative 7 %   Monocytes Absolute 0.5 0.1 - 1.0 K/uL   Eosinophils Relative 2 %   Eosinophils Absolute 0.2 0.0 - 0.5 K/uL   Basophils Relative 1 %   Basophils Absolute 0.0 0.0 - 0.1 K/uL   Immature Granulocytes 1 %   Abs Immature Granulocytes 0.03 0.00 - 0.07 K/uL    Comment: Performed at [facility], [address], [place], [place] [address]  Lipase, blood     Status: None   Collection Time: 05/29/20  1:48 PM  Result Value Ref Range   Lipase 28 11 - 51 U/L    Comment: Performed at [facility], [address] [place], [place] [address]  Acetaminophen level     Status: Abnormal   Collection Time: 05/29/20  1:48 PM  Result Value Ref Range   Acetaminophen (Tylenol), Serum <10 (L) 10 - 30 ug/mL    Comment: (NOTE) Therapeutic concentrations vary significantly. A range of 10-30  ug/mL  may be an effective concentration for many patients. However, some  are best treated at concentrations outside of this range. Acetaminophen concentrations >150 ug/mL at 4 hours after ingestion  and >50 ug/mL at 12 hours after ingestion are often associated with  toxic reactions.  Performed at [facility], [address], [place], [place] [address]   Salicylate level     Status: Abnormal   Collection Time: 05/29/20  1:48 PM  Result Value Ref Range   Salicylate Lvl <7.0 (L) 7.0 - 30.0 mg/dL    Comment: Performed at [facility], [address], [place], [place] [address]  Ethanol     Status: None   Collection Time: 05/29/20  1:48 PM  Result Value Ref Range   Alcohol, Ethyl (B) <10 <10 mg/dL    Comment: (NOTE) Lowest detectable limit for serum alcohol is 10 mg/dL.  For medical purposes only. Performed at [facility], [address], [place], [place] [address]   CK     Status: Abnormal   Collection Time: 05/29/20  1:48 PM  Result Value Ref Range   Total CK 508 (H) 49 - 397 U/L    Comment: Performed at [facility], [address], [place], [place] [address]  Urine rapid drug screen (hosp performed)     Status: None   Collection Time: 05/29/20  2:26 PM  Result Value Ref Range   Opiates NONE DETECTED NONE DETECTED   Cocaine NONE DETECTED NONE DETECTED   Benzodiazepines NONE DETECTED NONE DETECTED   Amphetamines NONE DETECTED NONE DETECTED   Tetrahydrocannabinol NONE DETECTED NONE DETECTED   Barbiturates NONE DETECTED NONE DETECTED    Comment: (NOTE) DRUG SCREEN FOR MEDICAL PURPOSES ONLY.  IF CONFIRMATION IS NEEDED  FOR ANY PURPOSE, NOTIFY LAB WITHIN 5 DAYS.  LOWEST DETECTABLE LIMITS FOR URINE DRUG SCREEN Drug Class                     Cutoff (ng/mL) Amphetamine and metabolites    1000 Barbiturate and metabolites    200 Benzodiazepine                 200 Tricyclics and metabolites     300 Opiates and metabolites        300 Cocaine and metabolites        300 THC                             50 Performed at [facility], [address], [place], [place] [address]   Urinalysis, Routine w reflex microscopic Urine, Clean Catch     Status: Abnormal   Collection Time: 05/29/20  2:26 PM  Result Value Ref Range   Color, Urine STRAW (A) YELLOW   APPearance CLEAR CLEAR   Specific Gravity, Urine 1.003 (L) 1.005 - 1.030   pH 6.0 5.0 - 8.0   Glucose, UA 50 (A) NEGATIVE mg/dL   Hgb urine dipstick NEGATIVE NEGATIVE   Bilirubin Urine NEGATIVE NEGATIVE   Ketones, ur NEGATIVE NEGATIVE mg/dL   Protein, ur NEGATIVE NEGATIVE mg/dL   Nitrite NEGATIVE NEGATIVE   Leukocytes,Ua NEGATIVE NEGATIVE    Comment: Performed at [facility], [address], [place], [place] [address]    Medications:  Current Facility-Administered Medications  Medication Dose Route Frequency Provider Last Rate Last Admin  . amLODipine (NORVASC) tablet 10 mg  10 mg Oral Daily Couture, Cortni S, PA-C   10 mg at 05/30/20 0907  . FLUoxetine (PROZAC) capsule 20 mg  20 mg Oral Daily Couture, Cortni S, PA-C   20 mg at 05/30/20 0908  . metFORMIN (GLUCOPHAGE) tablet 500 mg  500 mg Oral Q breakfast Couture, Cortni S, PA-C   500 mg at 05/30/20 0907  . pantoprazole (PROTONIX) EC tablet 40 mg  40 mg Oral Daily Couture, Cortni S, PA-C   40 mg at 05/30/20 5493   Current Outpatient Medications  Medication Sig Dispense Refill  . amLODipine (NORVASC) 10 MG tablet Take 1 tablet (10 mg total) by mouth daily. 30 tablet 0  . Aspirin-Salicylamide-Caffeine (BC HEADACHE POWDER PO) Take 1 Package by mouth daily as needed (headache).    Marland Kitchen FLUoxetine (PROZAC) 20 MG capsule Take 1 capsule (20 mg total) by mouth daily. 30 capsule 0  . metFORMIN (GLUCOPHAGE) 500 MG tablet Take 1 tablet (500 mg total) by mouth daily with breakfast. 30 tablet 0  . pantoprazole (PROTONIX) 40 MG tablet Take 1 tablet (40 mg total) by mouth daily. 30 tablet 0  . blood glucose meter kit and supplies KIT Dispense based on patient and insurance  preference. Use up to four times daily as directed. (FOR ICD-9 250.00, 250.01). 1 each 0  . sucralfate (CARAFATE) 1 GM/10ML suspension Take 10 mLs (1 g total) by mouth 4 (four) times daily -  with meals and at bedtime. (Patient not taking: Reported on 05/29/2020) 420 mL 0    Psychiatric Specialty Exam: Physical Exam  Review of Systems  Blood pressure (!) 134/99, pulse 60, temperature 98.2 F (36.8 C), resp. rate 16, height 5\' 11"  (1.803 m), weight 97.5 kg, SpO2 96 %.Body mass index is 29.99 kg/m.  General Appearance: Disheveled  Eye Contact:  Fair  Speech:  Slow  Volume:  Normal  Mood:  Depressed  Affect:  Congruent  Thought Process:  Coherent  Orientation:  Full (Time, Place, and Person)  Thought Content:  Rumination  Suicidal Thoughts:  Yes.  with intent/plan  Homicidal Thoughts:  No  Memory:  Immediate;   Fair Recent;   Fair Remote;   Fair  Judgement:  Poor  Insight:  Fair  Psychomotor Activity:  Decreased  Concentration:  Concentration: Fair and Attention Span: Fair  Recall:  [employer] of Knowledge:  Fair  Language:  Fair  Akathisia:  No  Handed:  Right  AIMS (if indicated):     Assets:  Communication Skills Housing  ADL's:  Intact  Cognition:  WNL  Sleep:       Disposition: Recommend inpatient treatment. CSW notified for bed placement. ED staff updated. Home medications already started.  This service was provided via telemedicine using a 2-way, interactive audio and video technology with the identified patient and this Probation officer.  Connye Burkitt, NP 05/30/2020 5:08 PM

## 2020-05-30 NOTE — Progress Notes (Signed)
Patient information has been sent to [facility] via secure chat to review for potential admission. Patient meets inpatient criteria per Marciano Sequin, NP.   Situation ongoing, CSW will continue to monitor progress.    Signed:  Corky Crafts, MSW, [place], LCASA 05/30/2020 11:29 AM

## 2020-05-30 NOTE — ED Notes (Signed)
Pt recommended for impatient

## 2020-05-30 NOTE — BH Assessment (Addendum)
Disposition:  Patient recommended for inpatient treatment per Marciano Sequin, NP, under review at [facility]; @ 1444 no available beds at [facility]. Received notice from Medical Director of [facility] (Dr. Nelly Rout), that patient is ok to be faxed out due to no appropriate beds at [facility] for today. Patient faxed out to multiple facilities and under review for bed placement.   [facility]      [facility] Details     [facility] Details     [facility] Details     [facility] Details     [facility] Details     [facility] Details     [facility] Details     [facility] Details     [facility] Details     [facility] Details     [facility] Details     [facility] Details     [facility] Details     [facility] Details     [facility] Details     [facility] Details     [facility] Details     [facility] Details     [facility] Details     [facility]

## 2020-05-31 LAB — RESP PANEL BY RT-PCR (FLU A&B, COVID) ARPGX2
Influenza A by PCR: NEGATIVE
Influenza B by PCR: NEGATIVE
SARS Coronavirus 2 by RT PCR: NEGATIVE

## 2020-05-31 MED ORDER — ACETAMINOPHEN 325 MG PO TABS
650.0000 mg | ORAL_TABLET | Freq: Once | ORAL | Status: AC
  Administered 2020-05-31: 650 mg via ORAL
  Filled 2020-05-31: qty 2

## 2020-05-31 NOTE — Progress Notes (Signed)
Patient information has been sent to [facility] via secure chat to review for potential admission. Patient meets inpatient criteria per Marciano Sequin, NP.   Patient tentatively ACCEPTED pending negative COVID PCR test.   Situation ongoing, CSW will continue to monitor progress.    Signed:  Corky Crafts, MSW, [place], LCASA 05/31/2020 11:23 AM

## 2020-05-31 NOTE — ED Notes (Signed)
Per [facility], pt has been psych cleared

## 2020-05-31 NOTE — ED Provider Notes (Signed)
Pt no longer feels suicidal.  He has been psych cleared.  He is given outpatient resources.  He is encouraged to stop using meth.   Jacalyn Lefevre, MD 05/31/20 1515

## 2020-05-31 NOTE — ED Notes (Signed)
[facility] made aware pt has a negative covid test
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
## 2020-08-20 ENCOUNTER — Emergency Department (HOSPITAL_COMMUNITY): Payer: Self-pay

## 2020-08-20 ENCOUNTER — Encounter (HOSPITAL_COMMUNITY): Payer: Self-pay

## 2020-08-20 ENCOUNTER — Inpatient Hospital Stay (HOSPITAL_COMMUNITY)
Admission: EM | Admit: 2020-08-20 | Discharge: 2020-08-22 | DRG: 558 | Disposition: A | Discharge status: Home / Self Care | Payer: Self-pay | Attending: Family Medicine | Admitting: Family Medicine

## 2020-08-20 ENCOUNTER — Other Ambulatory Visit: Payer: Self-pay

## 2020-08-20 DIAGNOSIS — Z5900 Homelessness unspecified: Secondary | ICD-10-CM

## 2020-08-20 DIAGNOSIS — E119 Type 2 diabetes mellitus without complications: Secondary | ICD-10-CM | POA: Diagnosis present

## 2020-08-20 DIAGNOSIS — Z7984 Long term (current) use of oral hypoglycemic drugs: Secondary | ICD-10-CM

## 2020-08-20 DIAGNOSIS — Z79899 Other long term (current) drug therapy: Secondary | ICD-10-CM

## 2020-08-20 DIAGNOSIS — Z720 Tobacco use: Secondary | ICD-10-CM | POA: Diagnosis present

## 2020-08-20 DIAGNOSIS — R748 Abnormal levels of other serum enzymes: Secondary | ICD-10-CM | POA: Diagnosis present

## 2020-08-20 DIAGNOSIS — F32A Depression, unspecified: Secondary | ICD-10-CM | POA: Diagnosis present

## 2020-08-20 DIAGNOSIS — IMO0002 Reserved for concepts with insufficient information to code with codable children: Secondary | ICD-10-CM | POA: Diagnosis present

## 2020-08-20 DIAGNOSIS — E871 Hypo-osmolality and hyponatremia: Secondary | ICD-10-CM | POA: Diagnosis present

## 2020-08-20 DIAGNOSIS — E1165 Type 2 diabetes mellitus with hyperglycemia: Secondary | ICD-10-CM | POA: Diagnosis present

## 2020-08-20 DIAGNOSIS — M6282 Rhabdomyolysis: Principal | ICD-10-CM | POA: Diagnosis present

## 2020-08-20 DIAGNOSIS — Z9109 Other allergy status, other than to drugs and biological substances: Secondary | ICD-10-CM

## 2020-08-20 DIAGNOSIS — Z5902 Unsheltered homelessness: Secondary | ICD-10-CM | POA: Diagnosis present

## 2020-08-20 DIAGNOSIS — D72829 Elevated white blood cell count, unspecified: Secondary | ICD-10-CM | POA: Diagnosis present

## 2020-08-20 DIAGNOSIS — I10 Essential (primary) hypertension: Secondary | ICD-10-CM | POA: Diagnosis present

## 2020-08-20 DIAGNOSIS — F17210 Nicotine dependence, cigarettes, uncomplicated: Secondary | ICD-10-CM | POA: Diagnosis present

## 2020-08-20 DIAGNOSIS — R45851 Suicidal ideations: Secondary | ICD-10-CM | POA: Diagnosis present

## 2020-08-20 DIAGNOSIS — E860 Dehydration: Secondary | ICD-10-CM | POA: Diagnosis present

## 2020-08-20 DIAGNOSIS — F419 Anxiety disorder, unspecified: Secondary | ICD-10-CM | POA: Diagnosis present

## 2020-08-20 DIAGNOSIS — J449 Chronic obstructive pulmonary disease, unspecified: Secondary | ICD-10-CM | POA: Diagnosis present

## 2020-08-20 DIAGNOSIS — F1510 Other stimulant abuse, uncomplicated: Secondary | ICD-10-CM | POA: Diagnosis present

## 2020-08-20 DIAGNOSIS — K219 Gastro-esophageal reflux disease without esophagitis: Secondary | ICD-10-CM | POA: Diagnosis present

## 2020-08-20 DIAGNOSIS — Z8249 Family history of ischemic heart disease and other diseases of the circulatory system: Secondary | ICD-10-CM

## 2020-08-20 DIAGNOSIS — E7800 Pure hypercholesterolemia, unspecified: Secondary | ICD-10-CM | POA: Diagnosis present

## 2020-08-20 DIAGNOSIS — E876 Hypokalemia: Secondary | ICD-10-CM | POA: Diagnosis present

## 2020-08-20 DIAGNOSIS — Z20822 Contact with and (suspected) exposure to covid-19: Secondary | ICD-10-CM | POA: Diagnosis present

## 2020-08-20 DIAGNOSIS — F1514 Other stimulant abuse with stimulant-induced mood disorder: Secondary | ICD-10-CM | POA: Diagnosis present

## 2020-08-20 LAB — CBC WITH DIFFERENTIAL/PLATELET
Abs Immature Granulocytes: 0.07 10*3/uL (ref 0.00–0.07)
Basophils Absolute: 0 10*3/uL (ref 0.0–0.1)
Basophils Relative: 0 %
Eosinophils Absolute: 0.1 10*3/uL (ref 0.0–0.5)
Eosinophils Relative: 1 %
HCT: 41.8 % (ref 39.0–52.0)
Hemoglobin: 14.3 g/dL (ref 13.0–17.0)
Immature Granulocytes: 1 %
Lymphocytes Relative: 12 %
Lymphs Abs: 1.5 10*3/uL (ref 0.7–4.0)
MCH: 29.9 pg (ref 26.0–34.0)
MCHC: 34.2 g/dL (ref 30.0–36.0)
MCV: 87.4 fL (ref 80.0–100.0)
Monocytes Absolute: 1.4 10*3/uL — ABNORMAL HIGH (ref 0.1–1.0)
Monocytes Relative: 11 %
Neutro Abs: 9.8 10*3/uL — ABNORMAL HIGH (ref 1.7–7.7)
Neutrophils Relative %: 75 %
Platelets: 285 10*3/uL (ref 150–400)
RBC: 4.78 MIL/uL (ref 4.22–5.81)
RDW: 12.1 % (ref 11.5–15.5)
WBC: 12.9 10*3/uL — ABNORMAL HIGH (ref 4.0–10.5)
nRBC: 0 % (ref 0.0–0.2)

## 2020-08-20 LAB — COMPREHENSIVE METABOLIC PANEL
ALT: 71 U/L — ABNORMAL HIGH (ref 0–44)
AST: 156 U/L — ABNORMAL HIGH (ref 15–41)
Albumin: 4.1 g/dL (ref 3.5–5.0)
Alkaline Phosphatase: 66 U/L (ref 38–126)
Anion gap: 8 (ref 5–15)
BUN: 23 mg/dL — ABNORMAL HIGH (ref 6–20)
CO2: 27 mmol/L (ref 22–32)
Calcium: 8.7 mg/dL — ABNORMAL LOW (ref 8.9–10.3)
Chloride: 96 mmol/L — ABNORMAL LOW (ref 98–111)
Creatinine, Ser: 0.9 mg/dL (ref 0.61–1.24)
GFR, Estimated: >60 mL/min (ref >60)
Glucose, Bld: 155 mg/dL — ABNORMAL HIGH (ref 70–99)
Potassium: 3.4 mmol/L — ABNORMAL LOW (ref 3.5–5.1)
Sodium: 131 mmol/L — ABNORMAL LOW (ref 135–145)
Total Bilirubin: 2 mg/dL — ABNORMAL HIGH (ref 0.3–1.2)
Total Protein: 7 g/dL (ref 6.5–8.1)

## 2020-08-20 LAB — RAPID URINE DRUG SCREEN, HOSP PERFORMED
Amphetamines: POSITIVE — AB
Barbiturates: NOT DETECTED
Benzodiazepines: NOT DETECTED
Cocaine: NOT DETECTED
Opiates: NOT DETECTED
Tetrahydrocannabinol: NOT DETECTED

## 2020-08-20 LAB — RESP PANEL BY RT-PCR (FLU A&B, COVID) ARPGX2
Influenza A by PCR: NEGATIVE
Influenza B by PCR: NEGATIVE
SARS Coronavirus 2 by RT PCR: NEGATIVE

## 2020-08-20 LAB — GLUCOSE, CAPILLARY: Glucose-Capillary: 78 mg/dL (ref 70–99)

## 2020-08-20 LAB — CK: Total CK: 7042 U/L — ABNORMAL HIGH (ref 49–397)

## 2020-08-20 MED ORDER — POLYETHYLENE GLYCOL 3350 17 G PO PACK: 17.0000 g | PACK | Freq: Every day | ORAL | Status: DC | PRN

## 2020-08-20 MED ORDER — SODIUM CHLORIDE 0.9 % IV SOLN: 250.0000 mL | INTRAVENOUS | Status: DC | PRN

## 2020-08-20 MED ORDER — SODIUM CHLORIDE 0.9 % IV SOLN
1000.0000 mL | INTRAVENOUS | Status: DC
  Administered 2020-08-20: 1000 mL via INTRAVENOUS

## 2020-08-20 MED ORDER — METHOCARBAMOL 500 MG PO TABS
750.0000 mg | ORAL_TABLET | Freq: Four times a day (QID) | ORAL | Status: DC
  Administered 2020-08-21 (×2): 750 mg via ORAL
  Filled 2020-08-20 (×2): qty 2

## 2020-08-20 MED ORDER — SENNOSIDES-DOCUSATE SODIUM 8.6-50 MG PO TABS
2.0000 | ORAL_TABLET | Freq: Every day | ORAL | Status: DC
  Administered 2020-08-20 – 2020-08-21 (×2): 2 via ORAL
  Filled 2020-08-20 (×2): qty 2

## 2020-08-20 MED ORDER — SODIUM CHLORIDE 0.9% FLUSH: 3.0000 mL | Freq: Two times a day (BID) | INTRAVENOUS | Status: DC

## 2020-08-20 MED ORDER — ONDANSETRON HCL 4 MG PO TABS: 4.0000 mg | ORAL_TABLET | Freq: Four times a day (QID) | ORAL | Status: DC | PRN

## 2020-08-20 MED ORDER — SODIUM CHLORIDE 0.9% FLUSH
3.0000 mL | Freq: Two times a day (BID) | INTRAVENOUS | Status: DC
  Administered 2020-08-21 – 2020-08-22 (×2): 3 mL via INTRAVENOUS

## 2020-08-20 MED ORDER — INSULIN ASPART 100 UNIT/ML IJ SOLN: 0.0000 [IU] | Freq: Three times a day (TID) | INTRAMUSCULAR | Status: DC

## 2020-08-20 MED ORDER — ACETAMINOPHEN 650 MG RE SUPP: 650.0000 mg | Freq: Four times a day (QID) | RECTAL | Status: DC | PRN

## 2020-08-20 MED ORDER — ACETAMINOPHEN 325 MG PO TABS: 650.0000 mg | ORAL_TABLET | Freq: Four times a day (QID) | ORAL | Status: DC | PRN

## 2020-08-20 MED ORDER — SODIUM CHLORIDE 0.9 % IV BOLUS (SEPSIS)
1000.0000 mL | Freq: Once | INTRAVENOUS | Status: AC
  Administered 2020-08-20: 1000 mL via INTRAVENOUS

## 2020-08-20 MED ORDER — BISACODYL 10 MG RE SUPP: 10.0000 mg | Freq: Every day | RECTAL | Status: DC | PRN

## 2020-08-20 MED ORDER — NICOTINE 21 MG/24HR TD PT24
21.0000 mg | MEDICATED_PATCH | Freq: Every day | TRANSDERMAL | Status: DC
  Filled 2020-08-20 (×2): qty 1

## 2020-08-20 MED ORDER — SODIUM CHLORIDE 0.9% FLUSH
3.0000 mL | INTRAVENOUS | Status: DC | PRN
  Administered 2020-08-20: 3 mL via INTRAVENOUS

## 2020-08-20 MED ORDER — INSULIN ASPART 100 UNIT/ML IJ SOLN: 0.0000 [IU] | Freq: Every day | INTRAMUSCULAR | Status: DC

## 2020-08-20 MED ORDER — PANTOPRAZOLE SODIUM 40 MG PO TBEC
40.0000 mg | DELAYED_RELEASE_TABLET | Freq: Every day | ORAL | Status: DC
  Administered 2020-08-21 – 2020-08-22 (×2): 40 mg via ORAL
  Filled 2020-08-20 (×2): qty 1

## 2020-08-20 MED ORDER — SODIUM CHLORIDE 0.9 % IV SOLN: INTRAVENOUS | Status: DC

## 2020-08-20 MED ORDER — LORAZEPAM 2 MG/ML IJ SOLN: 1.0000 mg | Freq: Four times a day (QID) | INTRAMUSCULAR | Status: DC | PRN

## 2020-08-20 MED ORDER — HEPARIN SODIUM (PORCINE) 5000 UNIT/ML IJ SOLN
5000.0000 [IU] | Freq: Three times a day (TID) | INTRAMUSCULAR | Status: DC
  Administered 2020-08-20 – 2020-08-22 (×5): 5000 [IU] via SUBCUTANEOUS
  Filled 2020-08-20 (×6): qty 1

## 2020-08-20 MED ORDER — METFORMIN HCL 500 MG PO TABS: 500.0000 mg | ORAL_TABLET | Freq: Every day | ORAL | Status: DC

## 2020-08-20 MED ORDER — ONDANSETRON HCL 4 MG/2ML IJ SOLN: 4.0000 mg | Freq: Four times a day (QID) | INTRAMUSCULAR | Status: DC | PRN

## 2020-08-20 MED ORDER — TRAZODONE HCL 50 MG PO TABS
150.0000 mg | ORAL_TABLET | Freq: Every day | ORAL | Status: DC
  Administered 2020-08-20 – 2020-08-21 (×2): 150 mg via ORAL
  Filled 2020-08-20 (×2): qty 3

## 2020-08-20 MED ORDER — SODIUM CHLORIDE 0.9 % IV BOLUS
1000.0000 mL | Freq: Once | INTRAVENOUS | Status: AC
  Administered 2020-08-20: 1000 mL via INTRAVENOUS
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
## 2020-08-21 DIAGNOSIS — Z5902 Unsheltered homelessness: Secondary | ICD-10-CM

## 2020-08-21 DIAGNOSIS — F1510 Other stimulant abuse, uncomplicated: Secondary | ICD-10-CM

## 2020-08-21 DIAGNOSIS — Z720 Tobacco use: Secondary | ICD-10-CM

## 2020-08-21 DIAGNOSIS — R748 Abnormal levels of other serum enzymes: Secondary | ICD-10-CM

## 2020-08-21 DIAGNOSIS — E876 Hypokalemia: Secondary | ICD-10-CM

## 2020-08-21 DIAGNOSIS — E871 Hypo-osmolality and hyponatremia: Secondary | ICD-10-CM

## 2020-08-21 LAB — COMPREHENSIVE METABOLIC PANEL
ALT: 49 U/L — ABNORMAL HIGH (ref 0–44)
AST: 86 U/L — ABNORMAL HIGH (ref 15–41)
Albumin: 2.9 g/dL — ABNORMAL LOW (ref 3.5–5.0)
Alkaline Phosphatase: 48 U/L (ref 38–126)
Anion gap: 5 (ref 5–15)
BUN: 10 mg/dL (ref 6–20)
CO2: 25 mmol/L (ref 22–32)
Calcium: 7.6 mg/dL — ABNORMAL LOW (ref 8.9–10.3)
Chloride: 105 mmol/L (ref 98–111)
Creatinine, Ser: 0.75 mg/dL (ref 0.61–1.24)
GFR, Estimated: >60 mL/min (ref >60)
Glucose, Bld: 109 mg/dL — ABNORMAL HIGH (ref 70–99)
Potassium: 3.3 mmol/L — ABNORMAL LOW (ref 3.5–5.1)
Sodium: 135 mmol/L (ref 135–145)
Total Bilirubin: 1.1 mg/dL (ref 0.3–1.2)
Total Protein: 5.1 g/dL — ABNORMAL LOW (ref 6.5–8.1)

## 2020-08-21 LAB — CBC
HCT: 38.1 % — ABNORMAL LOW (ref 39.0–52.0)
Hemoglobin: 12.7 g/dL — ABNORMAL LOW (ref 13.0–17.0)
MCH: 29.8 pg (ref 26.0–34.0)
MCHC: 33.3 g/dL (ref 30.0–36.0)
MCV: 89.4 fL (ref 80.0–100.0)
Platelets: 239 10*3/uL (ref 150–400)
RBC: 4.26 MIL/uL (ref 4.22–5.81)
RDW: 12.2 % (ref 11.5–15.5)
WBC: 9.3 10*3/uL (ref 4.0–10.5)
nRBC: 0 % (ref 0.0–0.2)

## 2020-08-21 LAB — GLUCOSE, CAPILLARY
Glucose-Capillary: 92 mg/dL (ref 70–99)
Glucose-Capillary: 130 mg/dL — ABNORMAL HIGH (ref 70–99)
Glucose-Capillary: 142 mg/dL — ABNORMAL HIGH (ref 70–99)
Glucose-Capillary: 152 mg/dL — ABNORMAL HIGH (ref 70–99)
Glucose-Capillary: 115 mg/dL — ABNORMAL HIGH (ref 70–99)

## 2020-08-21 LAB — CK: Total CK: 3366 U/L — ABNORMAL HIGH (ref 49–397)

## 2020-08-21 MED ORDER — METHOCARBAMOL 500 MG PO TABS
750.0000 mg | ORAL_TABLET | Freq: Three times a day (TID) | ORAL | Status: DC | PRN
  Administered 2020-08-21: 750 mg via ORAL

## 2020-08-21 MED ORDER — POTASSIUM CHLORIDE CRYS ER 20 MEQ PO TBCR
40.0000 meq | EXTENDED_RELEASE_TABLET | Freq: Once | ORAL | Status: AC
  Administered 2020-08-21: 40 meq via ORAL
  Filled 2020-08-21: qty 2
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
## 2020-08-21 NOTE — Plan of Care (Signed)
  Problem: Education: Goal: Knowledge of General Education information will improve Description Including pain rating scale, medication(s)/side effects and non-pharmacologic comfort measures Outcome: Progressing   

## 2020-08-22 ENCOUNTER — Emergency Department (HOSPITAL_COMMUNITY)
Admission: EM | Admit: 2020-08-22 | Discharge: 2020-08-23 | Disposition: A | Discharge status: Home / Self Care | Payer: Self-pay | Attending: Emergency Medicine | Admitting: Emergency Medicine

## 2020-08-22 ENCOUNTER — Other Ambulatory Visit: Payer: Self-pay

## 2020-08-22 ENCOUNTER — Encounter (HOSPITAL_COMMUNITY): Payer: Self-pay

## 2020-08-22 DIAGNOSIS — F332 Major depressive disorder, recurrent severe without psychotic features: Secondary | ICD-10-CM | POA: Insufficient documentation

## 2020-08-22 DIAGNOSIS — F17210 Nicotine dependence, cigarettes, uncomplicated: Secondary | ICD-10-CM | POA: Insufficient documentation

## 2020-08-22 DIAGNOSIS — J449 Chronic obstructive pulmonary disease, unspecified: Secondary | ICD-10-CM | POA: Insufficient documentation

## 2020-08-22 DIAGNOSIS — M6282 Rhabdomyolysis: Principal | ICD-10-CM

## 2020-08-22 DIAGNOSIS — Z7982 Long term (current) use of aspirin: Secondary | ICD-10-CM | POA: Insufficient documentation

## 2020-08-22 DIAGNOSIS — F1510 Other stimulant abuse, uncomplicated: Secondary | ICD-10-CM | POA: Diagnosis present

## 2020-08-22 DIAGNOSIS — R45851 Suicidal ideations: Secondary | ICD-10-CM

## 2020-08-22 DIAGNOSIS — I10 Essential (primary) hypertension: Secondary | ICD-10-CM | POA: Insufficient documentation

## 2020-08-22 LAB — COMPREHENSIVE METABOLIC PANEL
ALT: 34 U/L (ref 0–44)
ALT: 40 U/L (ref 0–44)
AST: 47 U/L — ABNORMAL HIGH (ref 15–41)
AST: 47 U/L — ABNORMAL HIGH (ref 15–41)
Albumin: 2.6 g/dL — ABNORMAL LOW (ref 3.5–5.0)
Albumin: 2.8 g/dL — ABNORMAL LOW (ref 3.5–5.0)
Alkaline Phosphatase: 42 U/L (ref 38–126)
Alkaline Phosphatase: 47 U/L (ref 38–126)
Anion gap: 5 (ref 5–15)
Anion gap: 4 — ABNORMAL LOW (ref 5–15)
BUN: 6 mg/dL (ref 6–20)
BUN: 7 mg/dL (ref 6–20)
CO2: 23 mmol/L (ref 22–32)
CO2: 27 mmol/L (ref 22–32)
Calcium: 7.6 mg/dL — ABNORMAL LOW (ref 8.9–10.3)
Calcium: 7.7 mg/dL — ABNORMAL LOW (ref 8.9–10.3)
Chloride: 109 mmol/L (ref 98–111)
Chloride: 102 mmol/L (ref 98–111)
Creatinine, Ser: 0.71 mg/dL (ref 0.61–1.24)
Creatinine, Ser: 0.72 mg/dL (ref 0.61–1.24)
GFR, Estimated: >60 mL/min (ref >60)
GFR, Estimated: >60 mL/min (ref >60)
Glucose, Bld: 99 mg/dL (ref 70–99)
Glucose, Bld: 100 mg/dL — ABNORMAL HIGH (ref 70–99)
Potassium: 3.9 mmol/L (ref 3.5–5.1)
Potassium: 3.5 mmol/L (ref 3.5–5.1)
Sodium: 137 mmol/L (ref 135–145)
Sodium: 133 mmol/L — ABNORMAL LOW (ref 135–145)
Total Bilirubin: 0.6 mg/dL (ref 0.3–1.2)
Total Bilirubin: 0.5 mg/dL (ref 0.3–1.2)
Total Protein: 4.8 g/dL — ABNORMAL LOW (ref 6.5–8.1)
Total Protein: 5.6 g/dL — ABNORMAL LOW (ref 6.5–8.1)

## 2020-08-22 LAB — GLUCOSE, CAPILLARY
Glucose-Capillary: 108 mg/dL — ABNORMAL HIGH (ref 70–99)
Glucose-Capillary: 127 mg/dL — ABNORMAL HIGH (ref 70–99)
Glucose-Capillary: 140 mg/dL — ABNORMAL HIGH (ref 70–99)
Glucose-Capillary: 134 mg/dL — ABNORMAL HIGH (ref 70–99)

## 2020-08-22 LAB — CBC
HCT: 37 % — ABNORMAL LOW (ref 39.0–52.0)
Hemoglobin: 12.3 g/dL — ABNORMAL LOW (ref 13.0–17.0)
MCH: 30.3 pg (ref 26.0–34.0)
MCHC: 33.2 g/dL (ref 30.0–36.0)
MCV: 91.1 fL (ref 80.0–100.0)
Platelets: 233 10*3/uL (ref 150–400)
RBC: 4.06 MIL/uL — ABNORMAL LOW (ref 4.22–5.81)
RDW: 11.9 % (ref 11.5–15.5)
WBC: 7.2 10*3/uL (ref 4.0–10.5)
nRBC: 0 % (ref 0.0–0.2)

## 2020-08-22 LAB — RAPID URINE DRUG SCREEN, HOSP PERFORMED
Amphetamines: POSITIVE — AB
Barbiturates: NOT DETECTED
Benzodiazepines: NOT DETECTED
Cocaine: NOT DETECTED
Opiates: NOT DETECTED
Tetrahydrocannabinol: NOT DETECTED

## 2020-08-22 LAB — ACETAMINOPHEN LEVEL: Acetaminophen (Tylenol), Serum: <10 ug/mL — ABNORMAL LOW (ref 10–30)

## 2020-08-22 LAB — ETHANOL: Alcohol, Ethyl (B): <10 mg/dL (ref <10)

## 2020-08-22 LAB — CK
Total CK: 1435 U/L — ABNORMAL HIGH (ref 49–397)
Total CK: 1773 U/L — ABNORMAL HIGH (ref 49–397)

## 2020-08-22 LAB — MAGNESIUM: Magnesium: 1.8 mg/dL (ref 1.7–2.4)

## 2020-08-22 LAB — SALICYLATE LEVEL: Salicylate Lvl: <7 mg/dL — ABNORMAL LOW (ref 7.0–30.0)

## 2020-08-22 MED ORDER — METFORMIN HCL 500 MG PO TABS
500.0000 mg | ORAL_TABLET | Freq: Two times a day (BID) | ORAL | Status: DC
  Administered 2020-08-23 (×2): 500 mg via ORAL
  Filled 2020-08-22 (×2): qty 1

## 2020-08-22 MED ORDER — ONDANSETRON HCL 4 MG/2ML IJ SOLN: 4.0000 mg | Freq: Four times a day (QID) | INTRAMUSCULAR | Status: DC | PRN

## 2020-08-22 MED ORDER — ACETAMINOPHEN 650 MG RE SUPP: 650.0000 mg | Freq: Four times a day (QID) | RECTAL | Status: DC | PRN

## 2020-08-22 MED ORDER — ACETAMINOPHEN 325 MG PO TABS: 650.0000 mg | ORAL_TABLET | Freq: Four times a day (QID) | ORAL | Status: DC | PRN

## 2020-08-22 MED ORDER — NICOTINE 21 MG/24HR TD PT24
21.0000 mg | MEDICATED_PATCH | Freq: Every day | TRANSDERMAL | Status: DC
  Administered 2020-08-23: 21 mg via TRANSDERMAL
  Filled 2020-08-22: qty 1

## 2020-08-22 MED ORDER — ONDANSETRON HCL 4 MG PO TABS: 4.0000 mg | ORAL_TABLET | Freq: Four times a day (QID) | ORAL | Status: DC | PRN

## 2020-08-22 MED ORDER — ESCITALOPRAM OXALATE 10 MG PO TABS
10.0000 mg | ORAL_TABLET | Freq: Every day | ORAL | Status: DC
  Administered 2020-08-22: 10 mg via ORAL
  Filled 2020-08-22: qty 1

## 2020-08-22 NOTE — Plan of Care (Signed)
  Problem: Education: Goal: Knowledge of General Education information will improve Description: Including pain rating scale, medication(s)/side effects and non-pharmacologic comfort measures Outcome: Adequate for Discharge   Problem: Health Behavior/Discharge Planning: Goal: Ability to manage health-related needs will improve Outcome: Adequate for Discharge   Problem: Clinical Measurements: Goal: Ability to maintain clinical measurements within normal limits will improve Outcome: Adequate for Discharge Goal: Will remain free from infection Outcome: Adequate for Discharge Goal: Diagnostic test results will improve Outcome: Adequate for Discharge Goal: Respiratory complications will improve Outcome: Adequate for Discharge Goal: Cardiovascular complication will be avoided Outcome: Adequate for Discharge   Problem: Activity: Goal: Risk for activity intolerance will decrease Outcome: Adequate for Discharge   Problem: Nutrition: Goal: Adequate nutrition will be maintained Outcome: Adequate for Discharge   Problem: Coping: Goal: Level of anxiety will decrease Outcome: Adequate for Discharge   Problem: Pain Managment: Goal: General experience of comfort will improve Outcome: Adequate for Discharge   Problem: Safety: Goal: Ability to remain free from injury will improve Outcome: Adequate for Discharge   Problem: Skin Integrity: Goal: Risk for impaired skin integrity will decrease Outcome: Adequate for Discharge   

## 2020-08-22 NOTE — ED Notes (Signed)
Pt came in wearing a hospital paperscrub shirt and a pair of blue jeans. Pt also came with a back pack. Backpack placed in locker room. wanded by security. Standing orders placed. Will put in psych scrubs when a room is available.
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
## 2020-08-22 NOTE — Discharge Instructions (Signed)
Rhabdomyolysis Rhabdomyolysis is a condition that happens when muscle cells break down and release substances into the blood that can damage the kidneys. This condition happens because of damage to the muscles that move bones (skeletal muscles). When the skeletal muscles are damaged, substances inside the muscle cells go into the blood. One of these substances is a protein called myoglobin. Large amounts of myoglobin can cause kidney damage or kidney failure. Other substances that are released by muscle cells may upset the balance of the minerals (electrolytes) in the blood. This imbalance causes the blood to have too much acid (acidosis). What are the causes? This condition is caused by muscle damage. There are common causes and possible causes. Common causes of the condition Common causes of muscle damage include:  Using the muscles too much.  Getting an injury that crushes or squeezes a muscle too tightly.  Using drugs, mainly cocaine.  Drinking too much alcohol. Possible causes of the condition Other possible causes of this condition include:  Some prescription medicines, such as: ? Medicines that lower cholesterol (statins). ? Amphetamines, which are used to treat attention deficit hyperactivity disorder (ADHD) or to help with weight loss. ? Certain pain medicines (opiates).  Infections.  Muscle diseases that are passed down from parent to child (inherited).  High fever.  Heatstroke.  Dehydration.  Seizures.  Surgery. What increases the risk? The following factors may make you more likely to develop this condition:  A family history of muscle disease.  Taking part in extreme sports, such as running in marathons.  Diabetes.  Being an older adult.  Heavy drug or alcohol use. What are the signs or symptoms? A person can have the symptoms of rhabdomyolysis or complications that come from the symptoms. Symptoms of the disease Symptoms of this condition vary. Some  people have very few symptoms and other people have many symptoms.  Common symptoms include:  Muscle pain and swelling.  Weak muscles.  Dark urine.  Feeling weak and tired. Other symptoms include:  Nausea and vomiting.  A fever.  Pain in the abdomen.  Pain in the joints. Complications from the disease Complications from this condition include:  A heart rhythm that is not normal (arrhythmia).  Seizures.  Not urinating enough because of kidney failure.  Very low blood pressure (hypotension) and shock. Signs of shock include dizziness, blurry vision, and clammy skin.  Bleeding that is hard to stop or control.  Compartment syndrome. This is when there is too much pressure in the space surrounding muscles. How is this diagnosed? This condition is diagnosed based on:  Your symptoms and medical history.  A physical exam.  Tests, including: ? Blood tests. ? Urine tests to check for myoglobin. You may also have other tests to check for causes of muscle damage and to check for complications. How is this treated? This condition may be treated with:  Fluids and medicines given through an IV that is inserted into one of your veins.  Medicines to lower acidosis or to restore the balance of electrolytes in your blood.  Hemodialysis. This treatment uses an artificial kidney machine to filter your blood while you recover. You may have this if other treatments are not helping.   Follow these instructions at home: Activity  Rest at home as told by your health care provider.  Return to your normal activities as told by your health care provider. Ask your health care provider what activities are safe for you.  Do not do activities that take  a lot of effort. Ask your health care provider what level of exercise is safe for you. Alcohol use  Do not drink alcohol if: ? Your health care provider tells you not to drink. ? You are pregnant, may be pregnant, or are planning to  become pregnant.  If you drink alcohol: ? Limit how much you use to:  0-1 drink a day for women.  0-2 drinks a day for men. ? Be aware of how much alcohol is in your drink. In the [place] one drink equals one 12 oz bottle of beer (355 mL), one 5 oz glass of wine (148 mL), or one 1 oz glass of hard liquor (44 mL). General instructions  Take over-the-counter and prescription medicines only as told by your health care provider.  Drink enough fluid to keep your urine pale yellow.  Do not use drugs.  If you are having problems with drug or alcohol use, ask your health care provider for help.  Keep all follow-up visits as told by your health care provider. This is important.   Contact a health care provider if:  You start having symptoms of this condition after treatment. Get help right away if you:  Have a seizure.  Bleed easily or cannot control bleeding.  Cannot urinate.  Have chest pain.  Have trouble breathing. These symptoms may represent a serious problem that is an emergency. Do not wait to see if the symptoms will go away. Get medical help right away. Call your local emergency services (911 in the [place] Do not drive yourself to the hospital.  Summary  Rhabdomyolysis is a condition that happens when muscle cells break down and release substances into the blood that can damage the kidneys.  This condition happens because of damage to the muscles that move bones (skeletal muscle). It can also be caused by too much alcohol and drug use.  Treatment may include fluids, medicines, and helping the kidneys filter blood (hemodialysis).  Contact your health care provider if you start having symptoms of this condition after treatment.  Get help right away if you have a seizure, chest pain, or trouble breathing, or if you bleed easily, cannot control bleeding, or cannot urinate. This information is not intended to replace advice given to you by your health care provider. Make sure  you discuss any questions you have with your health care provider. Document Revised: 12/18/2018 Document Reviewed: 12/10/2018 Elsevier Patient Education  2021 Elsevier Inc.    IMPORTANT INFORMATION: PAY CLOSE ATTENTION   PHYSICIAN DISCHARGE INSTRUCTIONS  Follow with Primary care provider  Patient, No Pcp Per (Inactive)  and other consultants as instructed by your Hospitalist Physician  SEEK MEDICAL CARE OR RETURN TO EMERGENCY ROOM IF SYMPTOMS COME BACK, WORSEN OR NEW PROBLEM DEVELOPS   Please note: You were cared for by a hospitalist during your hospital stay. Every effort will be made to forward records to your primary care provider.  You can request that your primary care provider send for your hospital records if they have not received them.  Once you are discharged, your primary care physician will handle any further medical issues. Please note that NO REFILLS for any discharge medications will be authorized once you are discharged, as it is imperative that you return to your primary care physician (or establish a relationship with a primary care physician if you do not have one) for your post hospital discharge needs so that they can reassess your need for medications and monitor your lab values.  Please get a complete blood count and chemistry panel checked by your Primary MD at your next visit, and again as instructed by your Primary MD.  Get Medicines reviewed and adjusted: Please take all your medications with you for your next visit with your Primary MD  Laboratory/radiological data: Please request your Primary MD to go over all hospital tests and procedure/radiological results at the follow up, please ask your primary care provider to get all Hospital records sent to his/her office.  In some cases, they will be blood work, cultures and biopsy results pending at the time of your discharge. Please request that your primary care provider follow up on these results.  If you are  diabetic, please bring your blood sugar readings with you to your follow up appointment with primary care.    Please call and make your follow up appointments as soon as possible.    Also Note the following: If you experience worsening of your admission symptoms, develop shortness of breath, life threatening emergency, suicidal or homicidal thoughts you must seek medical attention immediately by calling 911 or calling your MD immediately  if symptoms less severe.  You must read complete instructions/literature along with all the possible adverse reactions/side effects for all the Medicines you take and that have been prescribed to you. Take any new Medicines after you have completely understood and accpet all the possible adverse reactions/side effects.   Do not drive when taking Pain medications or sleeping medications (Benzodiazepines)  Do not take more than prescribed Pain, Sleep and Anxiety Medications. It is not advisable to combine anxiety,sleep and pain medications without talking with your primary care practitioner  Special Instructions: If you have smoked or chewed Tobacco  in the last 2 yrs please stop smoking, stop any regular Alcohol  and or any Recreational drug use.  Wear Seat belts while driving.  Do not drive if taking any narcotic, mind altering or controlled substances or recreational drugs or alcohol.

## 2020-08-22 NOTE — ED Notes (Signed)
One bag of belongings placed on top shelf in locker room. One backpack placed in locked locker

## 2020-08-22 NOTE — Progress Notes (Signed)
Nsg Discharge Note  Admit Date:  08/20/2020 Discharge date: 08/22/2020   Willie Herman to be D/C'd Home per MD order.  AVS completed.  Copy for chart, and copy for patient signed, and dated. Patient/caregiver able to verbalize understanding. Removed IV-CDI. Patient was angry when I told him he was being d/cd. He said he did not have anywhere to go and that he was going to just go to the ED when he got d/cd. Stable patient and belongings wheeled to ED entrance. Discharge Medication: Allergies as of 08/22/2020      Reactions   Pineapple Shortness Of Breath, Rash      Medication List    STOP taking these medications   amLODipine 10 MG tablet Commonly known as: NORVASC   FLUoxetine 20 MG capsule Commonly known as: PROZAC   pantoprazole 40 MG tablet Commonly known as: PROTONIX   sucralfate 1 GM/10ML suspension Commonly known as: CARAFATE     TAKE these medications   BC HEADACHE POWDER PO Take 1 Package by mouth daily as needed (headache).   blood glucose meter kit and supplies Kit Dispense based on patient and insurance preference. Use up to four times daily as directed. (FOR ICD-9 250.00, 250.01).   escitalopram 10 MG tablet Commonly known as: LEXAPRO Take by mouth.   metFORMIN 500 MG tablet Commonly known as: GLUCOPHAGE Take 1 tablet (500 mg total) by mouth daily with breakfast. What changed: when to take this   traZODone 150 MG tablet Commonly known as: DESYREL Take 150 mg by mouth at bedtime.       Discharge Assessment: Vitals:   08/22/20 0440 08/22/20 1337  BP: 136/85 116/71  Pulse: 80 65  Resp: 19 18  Temp: 98.2 F (36.8 C) 98.4 F (36.9 C)  SpO2: 98% 100%   Skin clean, dry and intact without evidence of skin break down, no evidence of skin tears noted. IV catheter discontinued intact. Site without signs and symptoms of complications - no redness or edema noted at insertion site, patient denies c/o pain - only slight tenderness at site.  Dressing with  slight pressure applied.  D/c Instructions-Education: Discharge instructions given to patient/family with verbalized understanding. D/c education completed with patient/family including follow up instructions, medication list, d/c activities limitations if indicated, with other d/c instructions as indicated by MD - patient able to verbalize understanding, all questions fully answered. Patient instructed to return to ED, call 911, or call MD for any changes in condition.  Patient escorted via [place], and D/C home via private auto.  Santa Lighter, RN 08/22/2020 6:11 PM

## 2020-08-22 NOTE — ED Notes (Signed)
Pt drinking water 

## 2020-08-22 NOTE — ED Triage Notes (Signed)
Pt here from home with cc of suicidal thoughts. Off and on for a while. Has a plan of taking his meds. Has not acted it out.

## 2020-08-22 NOTE — ED Provider Notes (Signed)
[facility] EMERGENCY DEPARTMENT Provider Note   CSN: [id number] Arrival date & time: 08/22/20  1836     History Chief Complaint  Patient presents with  . Suicidal    Willie Herman is a 45 y.o. male.  HPI Patient is a 45 year old male presented today with complaints of suicidal thoughts he states he plans to take all of his trazodone sent home.  He states he is actively suicidal.  Patient is not interested in any additional history giving.  Is reticent and unwilling to provide additional story however states he would like to see his psychiatrist.  Level 5 caveat psychiatric illness  He denies any other complaints at this time.  No other associate symptoms.  Denies any homicidal thoughts or AVH.  Notably patient was just recently hospitalized for rhabdomyolysis    Past Medical History:  Diagnosis Date  . Anxiety   . COPD (chronic obstructive pulmonary disease) ([place])   . Depression   . diet controlled diabetes    former  . GERD (gastroesophageal reflux disease)   . High cholesterol   . Hypertension   . Suicidal ideation     Patient Active Problem List   Diagnosis Date Noted  . Elevated CPK/Rhabdo 08/20/2020  . 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  . Unsheltered homelessness--[place] the Streets  08/20/2020  . Diabetes mellitus type 2, uncontrolled ([place]) 08/20/2020  . Suicidal ideation 05/18/2020  . Hyponatremia 05/18/2020  . Hypokalemia 05/18/2020  . Substance induced mood disorder ([place])   . Nicotine abuse 05/10/2020  . Stimulant abuse ([place]) 05/09/2020  . MDD (major depressive disorder) 05/07/2020  . Rhabdomyolysis 10/21/2019    Past Surgical History:  Procedure Laterality Date  . ABDOMINAL SURGERY    . APPENDECTOMY    . LEG SURGERY     when 12. Patient reports he was in tractor accident and leg had to be repaired  . MENISCUS REPAIR    . VASCULAR SURGERY         History reviewed. No pertinent family history.  Social History   Tobacco Use   . Smoking status: Current Every Day Smoker    Packs/day: 1.50    Types: Cigarettes  . Smokeless tobacco: Never Used  Vaping Use  . Vaping Use: Never used  Substance Use Topics  . Alcohol use: Not Currently  . Drug use: Yes    Types: Methamphetamines    Comment: crystal meth x 1 week ago    Home Medications Prior to Admission medications   Medication Sig Start Date End Date Taking? Authorizing Provider  Aspirin-Salicylamide-Caffeine (BC HEADACHE POWDER PO) Take 1 Package by mouth daily as needed (headache).    [provider]  blood glucose meter kit and supplies KIT Dispense based on patient and insurance preference. Use up to four times daily as directed. (FOR ICD-9 250.00, 250.01). 05/14/20   Ethelene Hal, NP  escitalopram (LEXAPRO) 10 MG tablet Take by mouth. 06/24/20   [provider]  metFORMIN (GLUCOPHAGE) 500 MG tablet Take 1 tablet (500 mg total) by mouth daily with breakfast. Patient taking differently: Take 500 mg by mouth 2 (two) times daily with a meal. 05/15/20   Ethelene Hal, NP  traZODone (DESYREL) 150 MG tablet Take 150 mg by mouth at bedtime.    [provider]    Allergies    Pineapple  Review of Systems   Review of Systems  Constitutional: Negative for chills and fever.  HENT: Negative for congestion.   Respiratory: Negative  for shortness of breath.   Cardiovascular: Negative for chest pain.  Gastrointestinal: Negative for abdominal pain.  Musculoskeletal: Negative for neck pain.  Psychiatric/Behavioral: Positive for suicidal ideas.    Physical Exam Updated Vital Signs BP 137/86 (BP Location: Right Arm)   Pulse (!) 55   Temp 98.4 F (36.9 C) (Oral)   Resp 20   Ht '5\' 11"'  (1.803 m)   Wt 88 kg   SpO2 97%   BMI 27.06 kg/m   Physical Exam Vitals and nursing note reviewed.  Constitutional:      General: He is not in acute distress.    Appearance: He is obese.     Comments: Chronically ill-appearing  45 year old male in no acute distress  HENT:     Head: Normocephalic and atraumatic.     Nose: Nose normal.  Eyes:     General: No scleral icterus. Cardiovascular:     Rate and Rhythm: Normal rate and regular rhythm.     Pulses: Normal pulses.     Heart sounds: Normal heart sounds.  Pulmonary:     Effort: Pulmonary effort is normal. No respiratory distress.     Breath sounds: No wheezing.     Comments: Coughing occasionally but normal effort.  Lungs are clear to auscultation all fields Abdominal:     Palpations: Abdomen is soft.     Tenderness: There is no abdominal tenderness.  Musculoskeletal:     Cervical back: Normal range of motion.     Right lower leg: No edema.     Left lower leg: No edema.  Skin:    General: Skin is warm and dry.     Capillary Refill: Capillary refill takes less than 2 seconds.  Neurological:     Mental Status: He is alert. Mental status is at baseline.  Psychiatric:        Mood and Affect: Mood normal.        Behavior: Behavior normal.     ED Results / Procedures / Treatments   Labs (all labs ordered are listed, but only abnormal results are displayed) Labs Reviewed  COMPREHENSIVE METABOLIC PANEL - Abnormal; Notable for the following components:      Result Value   Sodium 133 (*)    Glucose, Bld 100 (*)    Calcium 7.7 (*)    Total Protein 5.6 (*)    Albumin 2.8 (*)    AST 47 (*)    Anion gap 4 (*)    All other components within normal limits  SALICYLATE LEVEL - Abnormal; Notable for the following components:   Salicylate Lvl <8.5 (*)    All other components within normal limits  ACETAMINOPHEN LEVEL - Abnormal; Notable for the following components:   Acetaminophen (Tylenol), Serum <10 (*)    All other components within normal limits  CBC - Abnormal; Notable for the following components:   RBC 4.06 (*)    Hemoglobin 12.3 (*)    HCT 37.0 (*)    All other components within normal limits  RAPID URINE DRUG SCREEN, HOSP PERFORMED - Abnormal;  Notable for the following components:   Amphetamines POSITIVE (*)    All other components within normal limits  RESP PANEL BY RT-PCR (FLU A&B, COVID) [id number]  ETHANOL  CK    EKG None  Radiology No results found.  Procedures Procedures   Medications Ordered in ED Medications - No data to display  ED Course  I have reviewed the triage vital signs and the nursing notes.  Pertinent labs & imaging results that were available during my care of the patient were reviewed by me and considered in my medical decision making (see chart for details).    MDM Rules/Calculators/A&P                          Patient has history of similar presented today with suicidal thoughts.  Patient seems in no acute distress. Seems to be chronically using amphetamines.  Stable homicidal thoughts however given his risk factors male, drug abuse, etc. Will consult psychiatry.  Suspect patient will likely be able to be discharged home once evaluated by psych.  CBC without clinically significant anemia, no leukocytosis.  CMP unremarkable salicylate and Tylenol levels undetectable.  Ethanol undetectable.  Urine drug screen positive for amphetamines.  CK pending at this time if negative will medically clear.  COVID test ordered.  Not obtained yet. Patient seems to have chronically elevated CK.  CK is less than 5000. P.o. challenge successful.  Patient is tolerating oral fluids.  Will encourage fluids.  Nursing staff aware of this plan.  Patient is medically cleared at this time awaiting psychiatric evaluation. No IVC placed at this time.  Diet order placed.  Home medications ordered and PRN medication orders placed.  TTS consultation ordered as well.  Disposition pending psychiatry.  Willie Herman was evaluated in Emergency Department on 08/22/2020 for the symptoms described in the history of present illness. He was evaluated in the context of the global COVID-19 pandemic, which necessitated consideration that  the patient might be at risk for infection with the SARS-CoV-2 virus that causes COVID-19. Institutional protocols and algorithms that pertain to the evaluation of patients at risk for COVID-19 are in a state of rapid change based on information released by regulatory bodies including the CDC and federal and state organizations. These policies and algorithms were followed during the patient's care in the ED.   Final Clinical Impression(s) / ED Diagnoses Final diagnoses:  Suicidal thoughts    Rx / DC Orders ED Discharge Orders    None       Tedd Sias, [place] 08/22/20 2343    Hayden Rasmussen, MD 08/23/20 1047

## 2020-08-22 NOTE — TOC Transition Note (Signed)
Transition of Care [facility]) - CM/SW Discharge Note   Patient Details  Name: Willie Herman MRN: [id number] Date of Birth: 1975-12-23  [facility] CM/SW Contact:  Barry Brunner, LCSW Phone Number: 08/22/2020, 12:47 PM   Clinical Narrative:    CSW notified of patient's readiness for discharge. CSW received consult for SA and Housing. CSW observed patient's insurance status. CSW provided housing and SA resources. Patient agreeable to [facility] referral. CSW referred patient to [facility]. Patient reported that he has already applied for medicaid and is medicaid pending. TOC signing off.          Patient Goals and CMS Choice        Discharge Placement                       Discharge Plan and Services                                     Social Determinants of Health (SDOH) Interventions     Readmission Risk Interventions No flowsheet data found.

## 2020-08-23 DIAGNOSIS — R45851 Suicidal ideations: Secondary | ICD-10-CM

## 2020-08-23 DIAGNOSIS — F1510 Other stimulant abuse, uncomplicated: Secondary | ICD-10-CM

## 2020-08-23 DIAGNOSIS — F32A Depression, unspecified: Secondary | ICD-10-CM

## 2020-08-23 LAB — HEMOGLOBIN A1C
Hgb A1c MFr Bld: 6.1 % — ABNORMAL HIGH (ref 4.8–5.6)
Mean Plasma Glucose: 128 mg/dL

## 2020-08-23 NOTE — Progress Notes (Signed)
Patient meets inpatient criteria per Melbourne Abts ,NP. Patient referred to the following facilities:  [facility]  [address] [place] [address] [phone]  [facility]  [address] [place] [address] [phone]  [facility]  [address] [place] [address] [phone]  [facility]  [address], [place] [address] [phone]  [facility]  [address] [place] [address] [phone]  [facility]  [address] [place], [place] [address] [phone]  [facility]  [address] [place], [place] [address] [phone]  [facility]  [address], [place] [address] [phone]  [facility]  [address], [place] [address] [phone]    CSW will continue to monitor disposition.   Damita Dunnings, MSW, LCSW-A  7:51 AM 08/23/2020

## 2020-08-23 NOTE — Progress Notes (Addendum)
CSW updated that pt is being psych cleared and that he has been staying at the [facility] the [facility] in [place]. CSW reached out to shelter, they state they know pt and that he can return as they are not sure why he left. CSW updated that we will work on transportation and he will be arriving this evening. CSW spoke with pt about his interest in SA resources. Pt states that he has "been doing so well" because he can attend groups at the [facility]. Pt would like to return to [facility], CSW informed that transportation will be set up to get him back there this evening. Pt is very appreciative and states he does not need more resources because he will go to groups at the shelter. CSW reached out to [employer] who took pts information and will set pt up with a ride. TOC signing off.

## 2020-08-23 NOTE — Progress Notes (Signed)
Patient was reassessed by psychiatry. Patient is psychiatrically  cleared. Contacted SW for detox resources.

## 2020-08-23 NOTE — ED Notes (Signed)
Pt is being evaluated by TTS at this time

## 2020-08-23 NOTE — Discharge Instructions (Signed)
You were seen in the emergency department for suicidal thoughts.  You were evaluated by psychiatry who felt you did not need inpatient psychiatry.  We are giving you resources for detox facilities.  Social work will help assist in getting back to your shelter.  Return to the emergency department if any worsening or concerning symptoms

## 2020-08-23 NOTE — Progress Notes (Signed)
Patient information has been sent to [facility] via secure chat to review for potential admission. Patient meets inpatient criteria per Melbourne Abts, PA .   Situation ongoing, CSW will continue to monitor progress.    Signed:  Damita Dunnings, MSW, LCSW-A  08/23/2020 7:56 AM
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
## 2020-08-23 NOTE — ED Provider Notes (Signed)
Emergency Medicine Observation Re-evaluation Note  [facility] is a 45 y.o. male, seen on rounds today.  Pt initially presented to the ED for complaints of Suicidal Currently, the patient is cooperative.  Physical Exam  BP 119/83 (BP Location: Left Arm)   Pulse (!) 55   Temp 97.7 F (36.5 C) (Oral)   Resp 15   Ht 5\' 11"  (1.803 m)   Wt 88 kg   SpO2 100%   BMI 27.06 kg/m  Physical Exam General: No acute distress Cardiac: Well-perfused Lungs: Nonlabored Psych: Cooperative  ED Course / MDM  EKG:   I have reviewed the labs performed to date as well as medications administered while in observation.  Recent changes in the last 24 hours include psychiatric evaluation.  Plan  Current plan is for discharge back to shelter. Patient is not under full IVC at this time.  Patient was psychiatrically cleared.  Social work to give resources for detox.  Social work also to help return to his facility.   , MD 08/23/20 [phone]

## 2020-09-21 ENCOUNTER — Emergency Department (HOSPITAL_COMMUNITY)
Admission: EM | Admit: 2020-09-21 | Discharge: 2020-09-22 | Disposition: A | Discharge status: Other Acute Inpatient Hospital | Payer: Self-pay | Attending: Emergency Medicine | Admitting: Emergency Medicine

## 2020-09-21 ENCOUNTER — Encounter (HOSPITAL_COMMUNITY): Payer: Self-pay

## 2020-09-21 ENCOUNTER — Other Ambulatory Visit: Payer: Self-pay

## 2020-09-21 ENCOUNTER — Emergency Department (HOSPITAL_COMMUNITY): Payer: Self-pay

## 2020-09-21 DIAGNOSIS — E119 Type 2 diabetes mellitus without complications: Secondary | ICD-10-CM | POA: Insufficient documentation

## 2020-09-21 DIAGNOSIS — F17210 Nicotine dependence, cigarettes, uncomplicated: Secondary | ICD-10-CM | POA: Insufficient documentation

## 2020-09-21 DIAGNOSIS — Y900 Blood alcohol level of less than 20 mg/100 ml: Secondary | ICD-10-CM | POA: Insufficient documentation

## 2020-09-21 DIAGNOSIS — R45851 Suicidal ideations: Secondary | ICD-10-CM

## 2020-09-21 DIAGNOSIS — F1524 Other stimulant dependence with stimulant-induced mood disorder: Secondary | ICD-10-CM | POA: Insufficient documentation

## 2020-09-21 DIAGNOSIS — J449 Chronic obstructive pulmonary disease, unspecified: Secondary | ICD-10-CM | POA: Insufficient documentation

## 2020-09-21 DIAGNOSIS — Z7984 Long term (current) use of oral hypoglycemic drugs: Secondary | ICD-10-CM | POA: Insufficient documentation

## 2020-09-21 DIAGNOSIS — I10 Essential (primary) hypertension: Secondary | ICD-10-CM | POA: Insufficient documentation

## 2020-09-21 DIAGNOSIS — Z79899 Other long term (current) drug therapy: Secondary | ICD-10-CM | POA: Insufficient documentation

## 2020-09-21 DIAGNOSIS — T43292A Poisoning by other antidepressants, intentional self-harm, initial encounter: Secondary | ICD-10-CM | POA: Insufficient documentation

## 2020-09-21 DIAGNOSIS — X58XXXA Exposure to other specified factors, initial encounter: Secondary | ICD-10-CM | POA: Insufficient documentation

## 2020-09-21 DIAGNOSIS — Z20822 Contact with and (suspected) exposure to covid-19: Secondary | ICD-10-CM | POA: Insufficient documentation

## 2020-09-21 LAB — COMPREHENSIVE METABOLIC PANEL
ALT: 76 U/L — ABNORMAL HIGH (ref 0–44)
AST: 113 U/L — ABNORMAL HIGH (ref 15–41)
Albumin: 3.7 g/dL (ref 3.5–5.0)
Alkaline Phosphatase: 54 U/L (ref 38–126)
Anion gap: 4 — ABNORMAL LOW (ref 5–15)
BUN: 11 mg/dL (ref 6–20)
CO2: 28 mmol/L (ref 22–32)
Calcium: 8.3 mg/dL — ABNORMAL LOW (ref 8.9–10.3)
Chloride: 101 mmol/L (ref 98–111)
Creatinine, Ser: 0.84 mg/dL (ref 0.61–1.24)
GFR, Estimated: >60 mL/min (ref >60)
Glucose, Bld: 139 mg/dL — ABNORMAL HIGH (ref 70–99)
Potassium: 3.6 mmol/L (ref 3.5–5.1)
Sodium: 133 mmol/L — ABNORMAL LOW (ref 135–145)
Total Bilirubin: 0.7 mg/dL (ref 0.3–1.2)
Total Protein: 6.3 g/dL — ABNORMAL LOW (ref 6.5–8.1)

## 2020-09-21 LAB — CBC WITH DIFFERENTIAL/PLATELET
Abs Immature Granulocytes: 0.04 10*3/uL (ref 0.00–0.07)
Basophils Absolute: 0 10*3/uL (ref 0.0–0.1)
Basophils Relative: 1 %
Eosinophils Absolute: 0.4 10*3/uL (ref 0.0–0.5)
Eosinophils Relative: 5 %
HCT: 42.5 % (ref 39.0–52.0)
Hemoglobin: 14.7 g/dL (ref 13.0–17.0)
Immature Granulocytes: 1 %
Lymphocytes Relative: 15 %
Lymphs Abs: 1.2 10*3/uL (ref 0.7–4.0)
MCH: 30.4 pg (ref 26.0–34.0)
MCHC: 34.6 g/dL (ref 30.0–36.0)
MCV: 88 fL (ref 80.0–100.0)
Monocytes Absolute: 0.6 10*3/uL (ref 0.1–1.0)
Monocytes Relative: 7 %
Neutro Abs: 6 10*3/uL (ref 1.7–7.7)
Neutrophils Relative %: 71 %
Platelets: 200 10*3/uL (ref 150–400)
RBC: 4.83 MIL/uL (ref 4.22–5.81)
RDW: 12.1 % (ref 11.5–15.5)
WBC: 8.3 10*3/uL (ref 4.0–10.5)
nRBC: 0 % (ref 0.0–0.2)

## 2020-09-21 LAB — MAGNESIUM: Magnesium: 1.8 mg/dL (ref 1.7–2.4)

## 2020-09-21 LAB — CK
Total CK: 1616 U/L — ABNORMAL HIGH (ref 49–397)
Total CK: 912 U/L — ABNORMAL HIGH (ref 49–397)

## 2020-09-21 LAB — ETHANOL: Alcohol, Ethyl (B): <10 mg/dL (ref <10)

## 2020-09-21 LAB — RAPID URINE DRUG SCREEN, HOSP PERFORMED
Amphetamines: POSITIVE — AB
Barbiturates: NOT DETECTED
Benzodiazepines: NOT DETECTED
Cocaine: NOT DETECTED
Opiates: NOT DETECTED
Tetrahydrocannabinol: NOT DETECTED

## 2020-09-21 LAB — ACETAMINOPHEN LEVEL
Acetaminophen (Tylenol), Serum: <10 ug/mL — ABNORMAL LOW (ref 10–30)
Acetaminophen (Tylenol), Serum: <10 ug/mL — ABNORMAL LOW (ref 10–30)

## 2020-09-21 LAB — SALICYLATE LEVEL: Salicylate Lvl: <7 mg/dL — ABNORMAL LOW (ref 7.0–30.0)

## 2020-09-21 MED ORDER — ESCITALOPRAM OXALATE 10 MG PO TABS
10.0000 mg | ORAL_TABLET | Freq: Every day | ORAL | Status: DC
  Administered 2020-09-22: 10 mg via ORAL
  Filled 2020-09-21: qty 1

## 2020-09-21 MED ORDER — CHARCOAL ACTIVATED PO LIQD
75.0000 g | Freq: Once | ORAL | Status: AC
  Administered 2020-09-21: 75 g via ORAL
  Filled 2020-09-21: qty 480

## 2020-09-21 MED ORDER — METFORMIN HCL 500 MG PO TABS
500.0000 mg | ORAL_TABLET | Freq: Two times a day (BID) | ORAL | Status: DC
  Administered 2020-09-22: 500 mg via ORAL
  Filled 2020-09-21: qty 1

## 2020-09-21 MED ORDER — SODIUM CHLORIDE 0.9 % IV BOLUS
1000.0000 mL | Freq: Once | INTRAVENOUS | Status: AC
  Administered 2020-09-21: 1000 mL via INTRAVENOUS
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
## 2020-09-21 NOTE — ED Notes (Signed)
Patient drank 50g of activated charcoal at this time. Patient refused to drink another 25g of charcoal. [place] and emesis bag placed within patient reach.

## 2020-09-21 NOTE — ED Notes (Addendum)
Willie Herman with poison control called at this time. Patient information given at this time. 1gram/kg of activated charcoal up to 75g as Willie Herman as patient protects his airway. Further recommended 4 hour tylenol level. Add magnesium level. QTc above 500 optimize K and Mg to high normal level. Repeat EKG in 4 hours. Minimum 4 hours monitoring post ingestion.

## 2020-09-22 LAB — RESP PANEL BY RT-PCR (FLU A&B, COVID) ARPGX2
Influenza A by PCR: NEGATIVE
Influenza B by PCR: NEGATIVE
SARS Coronavirus 2 by RT PCR: NEGATIVE
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
## 2020-09-22 NOTE — BH Assessment (Signed)
Per Hassie Bruce, RN, at [phone], [facility] does not currently have an appropriate bed for this pt. SW will fax pt's referral information to multiple hospitals for potential placement.

## 2020-09-22 NOTE — Progress Notes (Signed)
Pt accepted to [facility]     Patient meets inpatient criteria per Liborio Nixon, NP  Dr.Buttar is the attending provider.    Call report to [phone]  Glenford Peers, RN @ [facility] notified.     Pt scheduled  to arrive at [facility] after 1300.    Damita Dunnings, MSW, LCSW-A  10:37 AM 09/22/2020

## 2020-09-22 NOTE — ED Provider Notes (Signed)
Emergency Medicine Observation Re-evaluation Note  Willie Herman is a 45 y.o. male, seen on rounds today.  Pt initially presented to the ED for complaints of Drug Overdose Currently, the patient is awaiting inpatient placement.  Physical Exam  BP 128/81   Pulse (!) 55   Temp 98.3 F (36.8 C) (Oral)   Resp 11   Ht 5\' 11"  (1.803 m)   Wt 92.1 kg   SpO2 96%   BMI 28.31 kg/m  Physical Exam General: Calm and cooperative  Cardiac: Well perfused  Lungs: Even unlabored respirations  Psych: Calm   ED Course / MDM  EKG:EKG Interpretation  Date/Time:  Wednesday September 21 2020 15:24:37 EDT Ventricular Rate:  61 PR Interval:  138 QRS Duration: 76 QT Interval:  448 QTC Calculation: 452 R Axis:   -3 Text Interpretation: Sinus rhythm Abnormal R-wave progression, early transition Borderline T abnormalities, inferior leads When compared with ECG of 05/18/2020, T wave abnormality Inferior leads is now present T wave abnormality Anterolateral leads has imroved Confirmed by 05/20/2020 (Dione Booze) on 09/22/2020 4:48:12 AM  I have reviewed the labs performed to date as well as medications administered while in observation.  Recent changes in the last 24 hours include medical clearance per EDP note and TTS evaluation recommending inpatient placement. CK checked twice yesterday and mildly elevated but down-trending.   11:00 AM  Patient accepted to [facility] by Dr. [facility] Stable for transfer.   Plan  Current plan is for inpatient psychiatry placement. Patient is not under full IVC at this time.   Forrestine Him, MD 09/22/20 1104

## 2020-09-22 NOTE — Progress Notes (Signed)
Patient has been faxed out due to no bed availability at [facility]. Patient meets inpatient criteria per Liborio Nixon ,NP. Patient referred to the following facilities:  [facility]  [address] [place] [address] [phone]  [facility]  [address] [place] [address] [phone]  [facility]  [address], [place] [address] [phone]  [facility]  [address], [place] [address] [phone]  [facility]  [address] [place] [address] [phone]  [facility]  [address] [place] [address] [phone]  [facility]  [address] [place], [place] [address] [phone]  [facility]  [address] [place], [place] [address] [phone]  [facility]  [address] [place] [address] [phone]  [facility]  [address], [place] [address] [phone]  [facility]  [address], [place] [address] [phone]     CSW will continue to monitor disposition.    Damita Dunnings, MSW, LCSW-A  9:32 AM 09/22/2020

## 2020-09-22 NOTE — ED Notes (Signed)
Pt tay at bedside

## 2020-09-22 NOTE — ED Notes (Signed)
Pt lunch is at bedside 

## 2020-09-22 NOTE — BH Assessment (Signed)
Clinician made contact w/ pt's team requesting documentation of medical clearance so pt can complete his MH Assessment. Documentation was provided at 2153; prior to that time, clinician had to see other pts that were waiting to be seen. TTS will attempt assessment at a later time.

## 2020-11-26 ENCOUNTER — Encounter (HOSPITAL_COMMUNITY): Payer: Self-pay

## 2020-11-26 ENCOUNTER — Other Ambulatory Visit: Payer: Self-pay

## 2020-11-26 ENCOUNTER — Emergency Department (HOSPITAL_COMMUNITY)
Admission: EM | Admit: 2020-11-26 | Discharge: 2020-11-29 | Disposition: A | Discharge status: Home / Self Care | Payer: Self-pay | Attending: Emergency Medicine | Admitting: Emergency Medicine

## 2020-11-26 ENCOUNTER — Emergency Department (HOSPITAL_COMMUNITY): Payer: Self-pay

## 2020-11-26 ENCOUNTER — Emergency Department (HOSPITAL_COMMUNITY)
Admission: EM | Admit: 2020-11-26 | Discharge: 2020-11-26 | Disposition: A | Discharge status: Home / Self Care | Payer: Self-pay | Attending: Emergency Medicine | Admitting: Emergency Medicine

## 2020-11-26 DIAGNOSIS — Z7984 Long term (current) use of oral hypoglycemic drugs: Secondary | ICD-10-CM | POA: Insufficient documentation

## 2020-11-26 DIAGNOSIS — F1924 Other psychoactive substance dependence with psychoactive substance-induced mood disorder: Secondary | ICD-10-CM | POA: Insufficient documentation

## 2020-11-26 DIAGNOSIS — S3991XA Unspecified injury of abdomen, initial encounter: Secondary | ICD-10-CM | POA: Insufficient documentation

## 2020-11-26 DIAGNOSIS — S0083XA Contusion of other part of head, initial encounter: Secondary | ICD-10-CM | POA: Insufficient documentation

## 2020-11-26 DIAGNOSIS — S90412A Abrasion, left great toe, initial encounter: Secondary | ICD-10-CM | POA: Insufficient documentation

## 2020-11-26 DIAGNOSIS — R000 Tachycardia, unspecified: Secondary | ICD-10-CM | POA: Insufficient documentation

## 2020-11-26 DIAGNOSIS — M542 Cervicalgia: Secondary | ICD-10-CM | POA: Insufficient documentation

## 2020-11-26 DIAGNOSIS — I10 Essential (primary) hypertension: Secondary | ICD-10-CM | POA: Insufficient documentation

## 2020-11-26 DIAGNOSIS — J449 Chronic obstructive pulmonary disease, unspecified: Secondary | ICD-10-CM | POA: Insufficient documentation

## 2020-11-26 DIAGNOSIS — Y900 Blood alcohol level of less than 20 mg/100 ml: Secondary | ICD-10-CM | POA: Insufficient documentation

## 2020-11-26 DIAGNOSIS — S90415A Abrasion, left lesser toe(s), initial encounter: Secondary | ICD-10-CM | POA: Insufficient documentation

## 2020-11-26 DIAGNOSIS — R0682 Tachypnea, not elsewhere classified: Secondary | ICD-10-CM | POA: Insufficient documentation

## 2020-11-26 DIAGNOSIS — Z20822 Contact with and (suspected) exposure to covid-19: Secondary | ICD-10-CM | POA: Insufficient documentation

## 2020-11-26 DIAGNOSIS — R45851 Suicidal ideations: Secondary | ICD-10-CM

## 2020-11-26 DIAGNOSIS — E119 Type 2 diabetes mellitus without complications: Secondary | ICD-10-CM | POA: Insufficient documentation

## 2020-11-26 DIAGNOSIS — S90411A Abrasion, right great toe, initial encounter: Secondary | ICD-10-CM | POA: Insufficient documentation

## 2020-11-26 DIAGNOSIS — F329 Major depressive disorder, single episode, unspecified: Secondary | ICD-10-CM | POA: Insufficient documentation

## 2020-11-26 DIAGNOSIS — F17210 Nicotine dependence, cigarettes, uncomplicated: Secondary | ICD-10-CM | POA: Insufficient documentation

## 2020-11-26 DIAGNOSIS — Z046 Encounter for general psychiatric examination, requested by authority: Secondary | ICD-10-CM | POA: Insufficient documentation

## 2020-11-26 DIAGNOSIS — Y9301 Activity, walking, marching and hiking: Secondary | ICD-10-CM | POA: Insufficient documentation

## 2020-11-26 DIAGNOSIS — Z5902 Unsheltered homelessness: Secondary | ICD-10-CM | POA: Diagnosis present

## 2020-11-26 DIAGNOSIS — T07XXXA Unspecified multiple injuries, initial encounter: Secondary | ICD-10-CM

## 2020-11-26 DIAGNOSIS — M545 Low back pain, unspecified: Secondary | ICD-10-CM | POA: Insufficient documentation

## 2020-11-26 DIAGNOSIS — Y92480 Sidewalk as the place of occurrence of the external cause: Secondary | ICD-10-CM | POA: Insufficient documentation

## 2020-11-26 DIAGNOSIS — S50312A Abrasion of left elbow, initial encounter: Secondary | ICD-10-CM | POA: Insufficient documentation

## 2020-11-26 LAB — CBC
HCT: 40.4 % (ref 39.0–52.0)
Hemoglobin: 14.3 g/dL (ref 13.0–17.0)
MCH: 30.4 pg (ref 26.0–34.0)
MCHC: 35.4 g/dL (ref 30.0–36.0)
MCV: 86 fL (ref 80.0–100.0)
Platelets: 231 10*3/uL (ref 150–400)
RBC: 4.7 MIL/uL (ref 4.22–5.81)
RDW: 12.6 % (ref 11.5–15.5)
WBC: 10.1 10*3/uL (ref 4.0–10.5)
nRBC: 0 % (ref 0.0–0.2)

## 2020-11-26 LAB — COMPREHENSIVE METABOLIC PANEL
ALT: 24 U/L (ref 0–44)
AST: 83 U/L — ABNORMAL HIGH (ref 15–41)
Albumin: 3.9 g/dL (ref 3.5–5.0)
Alkaline Phosphatase: 38 U/L (ref 38–126)
Anion gap: 8 (ref 5–15)
BUN: 11 mg/dL (ref 6–20)
CO2: 27 mmol/L (ref 22–32)
Calcium: 8.6 mg/dL — ABNORMAL LOW (ref 8.9–10.3)
Chloride: 102 mmol/L (ref 98–111)
Creatinine, Ser: 0.83 mg/dL (ref 0.61–1.24)
GFR, Estimated: >60 mL/min (ref >60)
Glucose, Bld: 90 mg/dL (ref 70–99)
Potassium: 3 mmol/L — ABNORMAL LOW (ref 3.5–5.1)
Sodium: 137 mmol/L (ref 135–145)
Total Bilirubin: 1.7 mg/dL — ABNORMAL HIGH (ref 0.3–1.2)
Total Protein: 5.7 g/dL — ABNORMAL LOW (ref 6.5–8.1)

## 2020-11-26 LAB — CBC WITH DIFFERENTIAL/PLATELET
Abs Immature Granulocytes: 0.05 10*3/uL (ref 0.00–0.07)
Basophils Absolute: 0 10*3/uL (ref 0.0–0.1)
Basophils Relative: 0 %
Eosinophils Absolute: 0 10*3/uL (ref 0.0–0.5)
Eosinophils Relative: 0 %
HCT: 41.8 % (ref 39.0–52.0)
Hemoglobin: 15.2 g/dL (ref 13.0–17.0)
Immature Granulocytes: 0 %
Lymphocytes Relative: 11 %
Lymphs Abs: 1.3 10*3/uL (ref 0.7–4.0)
MCH: 30.8 pg (ref 26.0–34.0)
MCHC: 36.4 g/dL — ABNORMAL HIGH (ref 30.0–36.0)
MCV: 84.6 fL (ref 80.0–100.0)
Monocytes Absolute: 0.8 10*3/uL (ref 0.1–1.0)
Monocytes Relative: 7 %
Neutro Abs: 9.6 10*3/uL — ABNORMAL HIGH (ref 1.7–7.7)
Neutrophils Relative %: 82 %
Platelets: 270 10*3/uL (ref 150–400)
RBC: 4.94 MIL/uL (ref 4.22–5.81)
RDW: 12.8 % (ref 11.5–15.5)
WBC: 11.8 10*3/uL — ABNORMAL HIGH (ref 4.0–10.5)
nRBC: 0 % (ref 0.0–0.2)

## 2020-11-26 LAB — ETHANOL: Alcohol, Ethyl (B): <10 mg/dL (ref <10)

## 2020-11-26 LAB — BASIC METABOLIC PANEL WITH GFR
Anion gap: 6 (ref 5–15)
BUN: 13 mg/dL (ref 6–20)
CO2: 23 mmol/L (ref 22–32)
Calcium: 8.6 mg/dL — ABNORMAL LOW (ref 8.9–10.3)
Chloride: 104 mmol/L (ref 98–111)
Creatinine, Ser: 1.21 mg/dL (ref 0.61–1.24)
GFR, Estimated: >60 mL/min (ref >60)
Glucose, Bld: 209 mg/dL — ABNORMAL HIGH (ref 70–99)
Potassium: 3.4 mmol/L — ABNORMAL LOW (ref 3.5–5.1)
Sodium: 133 mmol/L — ABNORMAL LOW (ref 135–145)

## 2020-11-26 LAB — SALICYLATE LEVEL: Salicylate Lvl: <7 mg/dL — ABNORMAL LOW (ref 7.0–30.0)

## 2020-11-26 LAB — RESP PANEL BY RT-PCR (FLU A&B, COVID) ARPGX2
Influenza A by PCR: NEGATIVE
Influenza B by PCR: NEGATIVE
SARS Coronavirus 2 by RT PCR: NEGATIVE

## 2020-11-26 LAB — ACETAMINOPHEN LEVEL: Acetaminophen (Tylenol), Serum: <10 ug/mL — ABNORMAL LOW (ref 10–30)

## 2020-11-26 MED ORDER — GABAPENTIN 300 MG PO CAPS
600.0000 mg | ORAL_CAPSULE | Freq: Three times a day (TID) | ORAL | Status: DC
  Administered 2020-11-26 – 2020-11-29 (×8): 600 mg via ORAL
  Filled 2020-11-26 (×8): qty 2

## 2020-11-26 MED ORDER — METFORMIN HCL 500 MG PO TABS
500.0000 mg | ORAL_TABLET | Freq: Every day | ORAL | Status: DC
  Administered 2020-11-27 – 2020-11-29 (×3): 500 mg via ORAL
  Filled 2020-11-26 (×3): qty 1

## 2020-11-26 MED ORDER — FENTANYL CITRATE PF 50 MCG/ML IJ SOSY
50.0000 ug | PREFILLED_SYRINGE | Freq: Once | INTRAMUSCULAR | Status: AC
  Administered 2020-11-26: 50 ug via INTRAVENOUS
  Filled 2020-11-26: qty 1

## 2020-11-26 MED ORDER — IBUPROFEN 400 MG PO TABS
400.0000 mg | ORAL_TABLET | Freq: Three times a day (TID) | ORAL | Status: DC
  Administered 2020-11-26 – 2020-11-29 (×8): 400 mg via ORAL
  Filled 2020-11-26 (×8): qty 1

## 2020-11-26 MED ORDER — ACETAMINOPHEN 325 MG PO TABS
650.0000 mg | ORAL_TABLET | Freq: Once | ORAL | Status: AC
  Administered 2020-11-26: 650 mg via ORAL
  Filled 2020-11-26: qty 2

## 2020-11-26 MED ORDER — ESCITALOPRAM OXALATE 10 MG PO TABS
10.0000 mg | ORAL_TABLET | Freq: Every day | ORAL | Status: DC
  Administered 2020-11-26 – 2020-11-29 (×4): 10 mg via ORAL
  Filled 2020-11-26 (×4): qty 1

## 2020-11-26 MED ORDER — ONDANSETRON HCL 4 MG/2ML IJ SOLN
4.0000 mg | Freq: Once | INTRAMUSCULAR | Status: AC
  Administered 2020-11-26: 4 mg via INTRAVENOUS
  Filled 2020-11-26: qty 2

## 2020-11-26 MED ORDER — IOHEXOL 350 MG/ML SOLN
80.0000 mL | Freq: Once | INTRAVENOUS | Status: AC | PRN
  Administered 2020-11-26: 80 mL via INTRAVENOUS

## 2020-11-26 MED ORDER — IBUPROFEN 800 MG PO TABS: 800.0000 mg | ORAL_TABLET | Freq: Three times a day (TID) | ORAL | 0 refills | Status: DC | PRN

## 2020-11-26 MED ORDER — TRAZODONE HCL 50 MG PO TABS
50.0000 mg | ORAL_TABLET | Freq: Every day | ORAL | Status: DC
  Administered 2020-11-26 – 2020-11-28 (×3): 50 mg via ORAL
  Filled 2020-11-26 (×3): qty 1

## 2020-11-26 MED ORDER — ARIPIPRAZOLE 5 MG PO TABS
15.0000 mg | ORAL_TABLET | Freq: Every day | ORAL | Status: DC
  Administered 2020-11-26 – 2020-11-29 (×4): 15 mg via ORAL
  Filled 2020-11-26 (×4): qty 3

## 2020-11-26 MED ORDER — POTASSIUM CHLORIDE CRYS ER 20 MEQ PO TBCR
40.0000 meq | EXTENDED_RELEASE_TABLET | Freq: Once | ORAL | Status: AC
  Administered 2020-11-26: 40 meq via ORAL
  Filled 2020-11-26: qty 2

## 2020-11-26 MED ORDER — NICOTINE 7 MG/24HR TD PT24
7.0000 mg | MEDICATED_PATCH | Freq: Every day | TRANSDERMAL | Status: DC
  Administered 2020-11-26 – 2020-11-29 (×4): 7 mg via TRANSDERMAL
  Filled 2020-11-26 (×4): qty 1
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
## 2020-11-26 NOTE — Discharge Instructions (Addendum)
Your x-rays and CT scans are negative for serious traumatic injury.  Take the anti-inflammatories as needed for aches and pains.  Follow-up with your doctor.  Keep your wounds clean and dry.  Return to the ED with new or worsening symptoms.

## 2020-11-26 NOTE — ED Notes (Signed)
Pt to family room for TTS w/ sitter

## 2020-11-26 NOTE — ED Notes (Signed)
Per Liborio Nixon, NP, she recommended pt be observed and monintored doe safety overnight with re-assessment by psychiatry tomorrow.
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
## 2020-11-26 NOTE — ED Notes (Signed)
Pt ambulated well around the nurses' station et drank a full cup of H2O with zero issues.
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
## 2020-11-28 NOTE — Progress Notes (Signed)
CSW contacted the Pt's former homeless shelter in [place], [place], who stated that the Pt would not be able to return due to him being absent from the program for too many days. CSW has contacted NP regarding new disposition.    Damita Dunnings, MSW, LCSW-A  1:09 PM 11/28/2020

## 2020-11-28 NOTE — Progress Notes (Signed)
Patient has been faxed out under the recommendation of Dr. Lucianne Muss due to [facility] being under COVID restrictions. Patient meets inpatient criteria per Dorena Bodo ,NP. Patient referred to the following facilities:  [facility]  [address] [place] [address] [phone]  [facility]  [address] [place] [address] [phone]  [facility]  [address], [place] [address] [phone]  [facility]  [address] [place] [address] [phone]  [facility]  [address] [place], [place] [address] [phone]  [facility]  [address] [place], [place] [address] [phone]  [facility]  [address] [place] [address] [phone]  [facility]  [address] [place] [address] [phone]  [facility]  [address], [place] [address] [phone]  [facility]  [address], [place] [address] [phone]    CSW will continue to monitor disposition.    Damita Dunnings, MSW, LCSW-A  1:20 PM 11/28/2020
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
## 2020-11-28 NOTE — ED Notes (Signed)
Patient being telepsyched in the room at this time.

## 2020-11-28 NOTE — ED Notes (Signed)
Patient's right lateral knee cleaned and wrapped with telfa and kling at this time per patient request.

## 2020-11-29 DIAGNOSIS — R45851 Suicidal ideations: Secondary | ICD-10-CM

## 2020-11-29 NOTE — ED Notes (Signed)
Lunch tray given to pt.

## 2020-11-29 NOTE — Progress Notes (Signed)
TOC consulted for homelessness resources. CSW reached out to the [facility] in [place] to confirm if pt could return to their center. CSW informed the director will look into it and call back in an hour with a final answer. CSW provided pt with a homelessness resource list and informed him that if he cannot return to the [facility] he will need to work on finding a shelter from [facility]. CSW updated pts RN. TOC signing off.

## 2020-11-29 NOTE — ED Provider Notes (Addendum)
  Physical Exam  BP 101/64 (BP Location: Left Arm)   Pulse (!) 59   Temp 98 F (36.7 C)   Resp 18   Ht 5\' 11"  (1.803 m)   Wt 83 kg   SpO2 95%   BMI 25.52 kg/m   Physical Exam  ED Course/Procedures     Procedures  MDM  Patient still pending placement.  Suicidal.  As of yesterday could not contract for placement.  No issues overnight.  Sleeping comfortably at this time.       , MD 11/29/20 0730  Patient has been seen by psychiatry and cleared for discharge.  Seen by social work and they have arranged for him to go back where he was staying previously.  Reportedly him being homeless was a big factor in his suicidal thoughts    01/29/21, MD 11/29/20 1344
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
## 2020-11-29 NOTE — Progress Notes (Signed)
CSW received call back from [employer] with [facility] who states that pt can return to the shelter in [place]. CSW spoke with pt about this and he is agreeable and would like to return there. CSW spoke with [employer] and they are working on getting pt a ride. TOC signing off.

## 2021-02-13 ENCOUNTER — Emergency Department (HOSPITAL_COMMUNITY)
Admission: EM | Admit: 2021-02-13 | Discharge: 2021-02-13 | Disposition: A | Discharge status: Home / Self Care | Payer: Self-pay | Attending: Emergency Medicine | Admitting: Emergency Medicine

## 2021-02-13 ENCOUNTER — Other Ambulatory Visit: Payer: Self-pay

## 2021-02-13 ENCOUNTER — Encounter (HOSPITAL_COMMUNITY): Payer: Self-pay | Admitting: Emergency Medicine

## 2021-02-13 DIAGNOSIS — Z79899 Other long term (current) drug therapy: Secondary | ICD-10-CM | POA: Insufficient documentation

## 2021-02-13 DIAGNOSIS — I10 Essential (primary) hypertension: Secondary | ICD-10-CM | POA: Insufficient documentation

## 2021-02-13 DIAGNOSIS — Z5900 Homelessness unspecified: Secondary | ICD-10-CM | POA: Insufficient documentation

## 2021-02-13 DIAGNOSIS — Z7984 Long term (current) use of oral hypoglycemic drugs: Secondary | ICD-10-CM | POA: Insufficient documentation

## 2021-02-13 DIAGNOSIS — J449 Chronic obstructive pulmonary disease, unspecified: Secondary | ICD-10-CM | POA: Insufficient documentation

## 2021-02-13 DIAGNOSIS — E119 Type 2 diabetes mellitus without complications: Secondary | ICD-10-CM | POA: Insufficient documentation

## 2021-02-13 DIAGNOSIS — R45851 Suicidal ideations: Secondary | ICD-10-CM | POA: Insufficient documentation

## 2021-02-13 DIAGNOSIS — F1510 Other stimulant abuse, uncomplicated: Secondary | ICD-10-CM | POA: Insufficient documentation

## 2021-02-13 DIAGNOSIS — F17210 Nicotine dependence, cigarettes, uncomplicated: Secondary | ICD-10-CM | POA: Insufficient documentation
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
## 2021-02-13 NOTE — ED Notes (Signed)
A lunch tray given to pt .

## 2021-02-13 NOTE — ED Notes (Signed)
Snack given.

## 2021-02-13 NOTE — ED Notes (Signed)
Belongings returned to pt. Pt to bathroom to get dressed. Pt to be discharged with list of shelter resources.

## 2021-02-13 NOTE — ED Triage Notes (Signed)
Pt called EMS to bring him in for SI. Pt also states he has been feeling SOB. Sats 100% on RA.   Security called to wand pt.

## 2021-02-13 NOTE — ED Notes (Signed)
TOC consulted for homelessness issues. Pt is well known to TOC. This CSW has worked to get pt back to [facility] for [facility] in [place] multiple times. Pt states that they would not let him in the last time he showed up. CSW explained that per phone call with them before sending him they stated they would accept.   CSW inquired about how pt returns to [place] after being in [place], pt states that his girlfriend comes to pick him up and brings him to [place] as she lives here with a friend.   CSW provided pt with homelessness resources and an ED phone so that he could call shelters to inquire about availability.  CSW spoke with [facility] supervisor to inquire about [employer] , it has been confirmed that the white flag shelter is not open tonight. CSW update ED provider of this. TOC signing off.

## 2021-02-13 NOTE — ED Notes (Signed)
No sig pad for [facility]

## 2021-02-13 NOTE — Discharge Instructions (Signed)
Substance Abuse Treatment Programs ° °Intensive Outpatient Programs °[facility]     °[address]      °[place], [place]                   °[phone]      ° °[facility] °[address] °[place], [place] °[phone] ° °[facility]     °(Inpatient and outpatient)     °[address]           °[phone]   ° °[facility] °[phone] (Suboxone and Methadone) ° °[address]      °[place], [place] [address]      °336-882-2125      ° °[address] °[place], [place] °852-3033 ° °[facility] (Outpatient/Inpatient, [facility])    °(insurance only) [phone]      °       °[facility] (Groups & Residential) °[place], [place] °336-389-1413 ° °   °[facility]     °[address]     °[place], [place]      °[phone]      ° °[facility] (for caregivers and family) °[address] °[place], [place] °336-299-4655 ° ° ° ° ° °Residential Treatment Programs °[facility]      °[address], [place], [place] [address]  °([phone]      ° °[facility] °[address] [place], [place] [address] °[phone] ° °[facility]        °336-248-8914      ° °[facility] °1-800-659-3381 ° °[facility]             °[address]                                         °[place], [place]                                                °877-615-2722 or 336-784-9470                              ° °[facility] [place] °[address] °[place], [address] °1.877.941.8954 ° °[facility]    °[address]      °[place], [place]     °[phone]      ° °[facility] °[address] °[place], [place] °336-285-9073 ° °[facility]   °[address]     °[place], [place] [address]     °[phone]      °Admissions: 8am-3pm M-F ° °[facility] °[address] °[place],  [place] °336-227-7417 ° °[facility]: Residential Program (90 Days)   °[place], [place]      °336-725-8389 or 800-758-6077    ° °[facility] °[place], [place] °(Walk in Hours over the weekend or by referral) ° °[facility] °[address], [place], [place] [address] °([phone] ° °Crisis Mobile: [facility]:  1-877-626-1772 (for crisis response 24 hours a day) °[facility] Hotline:      1-800-256-2452 °Outpatient Psychiatry and Counseling ° °[facility]: Mobile Crisis   Management 24 hours:  1-877-626-1772 ° °[facility] sliding scale fee and walk in schedule: M-F 8am-12pm/1pm-3pm °[address]  °[place], [place] [address] °[phone] ° °[facility] °[address] °[place], [place] [address] °336-703-9650 ° °[facility] (Former[facility] known [facility])- new patient walk-in appointments available Monday - Friday 8am -3pm.          °[address] °[place], [place] [address] °336-676-6840 or crisis line- 336-676-6905 ° °[facility]/ Intensive Outpatient Therapy Program °[address] °[place], [place] [address] °336-832-9804 ° °[facility]                  °Crisis Services      °[phone]      °[address]     °[place], [place] [address]                ° °[facility]   °[facility] °[phone] °[address] °[place], [place] [address] ° ° °[facility]          °[address],  °[place], [place] [address]       °([phone] ° °[facility] °[address] °[place], [place] [address] °336-292-1510 ° °[facility]    °[address]    °[place], [place] [address]     °[phone]      ° °Parish McKinney, MD     °[address]     °[place] [address]     °336-282-1251     °  °[facility] °[address] °[place] [address] ° °[facility]     °[address]     °[place], [place]      °336-542-2076      ° °[facility] °Shamsher Ahluwalia, MD °[address] °[place], [place] [address] °336-420-9558 ° °[facility]     °[address]     °[place], [place] [address]     °336-274-1237      ° °[facility] °[address] °[place], [place] [address] °336-854-4450 °Resources for Temporary Residential Assistance/Crisis Centers ° °DAY CENTERS °[facility] °M-F 8am-3pm   °[address] [place], [place] [address]   336-332-0824 °Services include: laundry, barbering, support groups, case management, phone  & computer access, showers, AA/NA mtgs, mental health/substance abuse nurse, job skills class, disability information, VA assistance, spiritual classes, etc.  ° °HOMELESS SHELTERS ° °[facility]     °[facility]   °[address], [place]     °[phone]       °       °[facility] (women and children)       °[address] °[place], [place] [address] °336-275-0820 °[email or web address] for application and process °Application Required ° °[facility]   °[address]    °[place] [address]     °[phone]       °             °[facility] °[address] °, [place] [address] °336.273.5572 °336-235-0363(schedule application appt.) °Application Required ° °[facility] (women only)    °[address]     °[place],  [address]     °336-884-1039      °  Intake starts 6pm daily °Need valid ID, SSC, & Police report °[facility] [place] °[address] °[place], [place] °336-881-5420 °Application Required ° °[facility] (men only)     °[address]      °[place], [place]     °[phone]      ° °[facility] °(Pregnant women only) °[address] °[place], [place] °336-275-0206 ° °[facility]      °[address]      °[place], [place] [address]     °336-722-9951      °       °[facility] °[address] °[place], [place] °336-723-1848 °90 day commitment/SA/Application process ° °[facility](men only)     °[address]     °[place], [place]     °[phone]       °Check-in at 7pm     °       °[facility] °[address] °[place], [place] [address] °336-248-6684 °Men/Women/Women and Children must be there by 7 pm ° °[facility] °[place], [place] °336-722-8721                ° °

## 2021-02-14 ENCOUNTER — Encounter (HOSPITAL_COMMUNITY): Payer: Self-pay | Admitting: *Deleted

## 2021-02-14 ENCOUNTER — Other Ambulatory Visit: Payer: Self-pay

## 2021-02-14 ENCOUNTER — Emergency Department (HOSPITAL_COMMUNITY)
Admission: EM | Admit: 2021-02-14 | Discharge: 2021-02-14 | Disposition: A | Discharge status: Home / Self Care | Payer: Self-pay | Attending: Emergency Medicine | Admitting: Emergency Medicine

## 2021-02-14 DIAGNOSIS — J449 Chronic obstructive pulmonary disease, unspecified: Secondary | ICD-10-CM | POA: Insufficient documentation

## 2021-02-14 DIAGNOSIS — Z7984 Long term (current) use of oral hypoglycemic drugs: Secondary | ICD-10-CM | POA: Insufficient documentation

## 2021-02-14 DIAGNOSIS — R45851 Suicidal ideations: Secondary | ICD-10-CM | POA: Insufficient documentation

## 2021-02-14 DIAGNOSIS — F17210 Nicotine dependence, cigarettes, uncomplicated: Secondary | ICD-10-CM | POA: Insufficient documentation

## 2021-02-14 DIAGNOSIS — E119 Type 2 diabetes mellitus without complications: Secondary | ICD-10-CM | POA: Insufficient documentation

## 2021-02-14 DIAGNOSIS — I10 Essential (primary) hypertension: Secondary | ICD-10-CM | POA: Insufficient documentation

## 2021-02-14 DIAGNOSIS — F339 Major depressive disorder, recurrent, unspecified: Secondary | ICD-10-CM | POA: Insufficient documentation

## 2021-02-14 NOTE — ED Notes (Signed)
PA attempted to talk to patient about discharge and now patient is claiming he took 30 trazadone prior to arrival. Patient has not been sedated or hard to arouse.  Immediately wakes up with voice.

## 2021-02-14 NOTE — ED Notes (Signed)
Security wanded pt in triage.  

## 2021-02-14 NOTE — ED Notes (Signed)
Patient sleeping at this time. Ok'd by provider to not change patient into scrubs
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
## 2021-02-14 NOTE — ED Provider Notes (Signed)
[facility] EMERGENCY DEPARTMENT Provider Note   CSN: [id number] Arrival date & time: 02/14/21  1134     History Chief Complaint  Patient presents with   V70.1    SI x 3 days    Willie Herman is a 45 y.o. male.  Pt complains of suicidal thoughts.  Pt reports he is homeless.  Pt states he is from [place] but can not get back to shelter.  Pt states it is cold outside.    The history is provided by the patient. No language interpreter was used.  Depression This is a new problem. The problem occurs constantly. Nothing aggravates the symptoms. Nothing relieves the symptoms. He has tried nothing for the symptoms. The treatment provided no relief.      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 Patient reports he was in tractor accident and leg had to be repaired   [place]         History reviewed. No pertinent family history.  Social History   Tobacco Use   Smoking status: Every Day    Packs/day: 1.50    Types: Cigarettes   Smokeless tobacco: Never  Vaping Use   Vaping Use: Never used  Substance Use  Topics   Alcohol use: Not Currently   Drug use: Yes    Types: Methamphetamines    Comment: last used 4 days ago    Home Medications Prior to Admission medications   Medication Sig Start Date End Date Taking? Authorizing Provider  ARIPiprazole (ABILIFY) 15 MG tablet Take by mouth. Patient not taking: Reported on 11/26/2020 10/16/20   [provider]  escitalopram (LEXAPRO) 10 MG tablet Take 10 mg by mouth daily. 06/24/20   [provider]  gabapentin (NEURONTIN) 300 MG capsule Take 600 mg by mouth 3 (three) times daily.    [provider]  ibuprofen (ADVIL) 200 MG tablet Take by mouth. Patient not taking: Reported on 11/26/2020    [provider]  ibuprofen (ADVIL) 800 MG tablet Take 1 tablet (800 mg total) by mouth every 8 (eight) hours as needed for moderate pain. 11/26/20   Rancour, Annie Main, MD  metFORMIN (GLUCOPHAGE) 500 MG tablet Take 1 tablet (500 mg total) by mouth daily with breakfast. Patient not taking: No sig reported 05/15/20   Ethelene Hal, NP  metFORMIN (GLUCOPHAGE) 500 MG tablet Take by mouth. Patient not taking: Reported on 11/26/2020 05/15/20   [provider]  nicotine (NICODERM CQ - DOSED IN MG/24 HOURS) 14 mg/24hr patch Place onto  the skin. Patient not taking: Reported on 11/26/2020    [provider]  pantoprazole (PROTONIX) 20 MG tablet Take 1 tablet by mouth daily. Patient not taking: Reported on 11/26/2020    [provider]  traZODone (DESYREL) 50 MG tablet Take 50 mg by mouth at bedtime. 10/16/20   [provider]  blood glucose meter kit and supplies KIT Dispense based on patient and insurance preference. Use up to four times daily as directed. (FOR ICD-9 250.00, 250.01). 05/14/20 09/21/20  Ethelene Hal, NP    Allergies    Pineapple  Review of Systems   Review of Systems  Psychiatric/Behavioral:  Positive for depression.   All other systems reviewed and are negative.  Physical  Exam Updated Vital Signs BP 119/85 (BP Location: Right Arm)   Pulse 80   Temp 98.1 F (36.7 C) (Oral)   Resp 16   Ht '5\' 11"'  (1.803 m)   Wt 83.9 kg   SpO2 100%   BMI 25.80 kg/m   Physical Exam Vitals and nursing note reviewed.  Constitutional:      General: He is not in acute distress.    Appearance: He is well-developed.  HENT:     Head: Normocephalic and atraumatic.  Eyes:     Conjunctiva/sclera: Conjunctivae normal.  Cardiovascular:     Rate and Rhythm: Normal rate and regular rhythm.     Heart sounds: No murmur heard. Pulmonary:     Effort: Pulmonary effort is normal. No respiratory distress.     Breath sounds: Normal breath sounds.  Abdominal:     Palpations: Abdomen is soft.     Tenderness: There is no abdominal tenderness.  Musculoskeletal:        General: No swelling.     Cervical back: Neck supple.  Skin:    General: Skin is warm and dry.     Capillary Refill: Capillary refill takes less than 2 seconds.  Neurological:     General: No focal deficit present.     Mental Status: He is alert.  Psychiatric:        Mood and Affect: Mood normal.    ED Results / Procedures / Treatments   Labs (all labs ordered are listed, but only abnormal results are displayed) Labs Reviewed - No data to display  EKG EKG Interpretation  Date/Time:  Tuesday February 14 2021 16:26:01 EST Ventricular Rate:  89 PR Interval:  160 QRS Duration: 64 QT Interval:  362 QTC Calculation: 440 R Axis:   13 Text Interpretation: Normal sinus rhythm Right atrial enlargement Nonspecific ST abnormality Abnormal ECG Since last tracing rate slower Confirmed by Dorie Rank [phone]) on 02/14/2021 4:37:49 PM  Radiology No results found.  Procedures Procedures   Medications Ordered in ED Medications - No data to display  ED Course  I have reviewed the triage vital signs and the nursing notes.  Pertinent labs & imaging results that were available during my care of the patient were  reviewed by me and considered in my medical decision making (see chart for details).    MDM Rules/Calculators/A&P                           Social work reports pt given resources here yesterday.  Pt is homeless.   Pt has chronic depression and substance abuse.   EKG obtained due to pt reporting he took trazadone.  Pt observed.  Pt awakes easily.  No sign of sedation.  I doubt overdose.  Pt has chronic suicidal thoughts.  I Lion't think pt is acutely suicidal.   Final Clinical Impression(s) / ED Diagnoses Final diagnoses:  Episode of recurrent major depressive disorder, unspecified depression episode severity ([place])    Rx / DC Orders ED Discharge Orders     None     An After Visit Summary was printed and given to the patient.    Fransico Meadow, [place] 02/14/21 1717    Wyvonnia Dusky, MD 02/15/21 [phone]

## 2021-02-14 NOTE — ED Notes (Signed)
TOC consulted for homeless needing shelter and trying to get to [facility] for shelter. CSW updated MD, PA, and RN that pt was provided with homelessness resource list yesterday afternoon and a ED phone so he could call shelters to see availability. CSW made call yesterday to [facility] in [place], CSW informed that pt would need to call back at the end of the week as they do not currently have any beds. TOC unable to provide any further resources to pt. TOC singing off.

## 2021-02-14 NOTE — ED Triage Notes (Signed)
Pt states he has SI x 3 days with plan-cut wrists. Pt c/o being cold.  Pt with sleeping bag and bag with him.

## 2021-11-16 ENCOUNTER — Encounter (HOSPITAL_COMMUNITY): Payer: Self-pay | Admitting: Emergency Medicine

## 2021-11-16 ENCOUNTER — Other Ambulatory Visit: Payer: Self-pay

## 2021-11-16 ENCOUNTER — Emergency Department (HOSPITAL_COMMUNITY): Payer: 59

## 2021-11-16 ENCOUNTER — Inpatient Hospital Stay (HOSPITAL_COMMUNITY)
Admission: EM | Admit: 2021-11-16 | Discharge: 2021-11-19 | DRG: 558 | Discharge status: Against Medical Advice | Payer: 59 | Attending: Family Medicine | Admitting: Family Medicine

## 2021-11-16 DIAGNOSIS — R059 Cough, unspecified: Secondary | ICD-10-CM | POA: Diagnosis not present

## 2021-11-16 DIAGNOSIS — F332 Major depressive disorder, recurrent severe without psychotic features: Secondary | ICD-10-CM | POA: Diagnosis not present

## 2021-11-16 DIAGNOSIS — F1514 Other stimulant abuse with stimulant-induced mood disorder: Secondary | ICD-10-CM | POA: Diagnosis present

## 2021-11-16 DIAGNOSIS — E119 Type 2 diabetes mellitus without complications: Secondary | ICD-10-CM | POA: Diagnosis not present

## 2021-11-16 DIAGNOSIS — F329 Major depressive disorder, single episode, unspecified: Secondary | ICD-10-CM | POA: Diagnosis not present

## 2021-11-16 DIAGNOSIS — Z9151 Personal history of suicidal behavior: Secondary | ICD-10-CM

## 2021-11-16 DIAGNOSIS — M6282 Rhabdomyolysis: Secondary | ICD-10-CM | POA: Diagnosis not present

## 2021-11-16 DIAGNOSIS — K219 Gastro-esophageal reflux disease without esophagitis: Secondary | ICD-10-CM | POA: Diagnosis present

## 2021-11-16 DIAGNOSIS — Z7984 Long term (current) use of oral hypoglycemic drugs: Secondary | ICD-10-CM

## 2021-11-16 DIAGNOSIS — F17210 Nicotine dependence, cigarettes, uncomplicated: Secondary | ICD-10-CM | POA: Diagnosis present

## 2021-11-16 DIAGNOSIS — Z8249 Family history of ischemic heart disease and other diseases of the circulatory system: Secondary | ICD-10-CM | POA: Diagnosis not present

## 2021-11-16 DIAGNOSIS — Z9049 Acquired absence of other specified parts of digestive tract: Secondary | ICD-10-CM | POA: Diagnosis not present

## 2021-11-16 DIAGNOSIS — E7800 Pure hypercholesterolemia, unspecified: Secondary | ICD-10-CM | POA: Diagnosis present

## 2021-11-16 DIAGNOSIS — Z79899 Other long term (current) drug therapy: Secondary | ICD-10-CM | POA: Diagnosis not present

## 2021-11-16 DIAGNOSIS — Z720 Tobacco use: Secondary | ICD-10-CM | POA: Diagnosis present

## 2021-11-16 DIAGNOSIS — Z6830 Body mass index (BMI) 30.0-30.9, adult: Secondary | ICD-10-CM

## 2021-11-16 DIAGNOSIS — Z91199 Patient's noncompliance with other medical treatment and regimen due to unspecified reason: Secondary | ICD-10-CM | POA: Diagnosis not present

## 2021-11-16 DIAGNOSIS — J449 Chronic obstructive pulmonary disease, unspecified: Secondary | ICD-10-CM | POA: Diagnosis not present

## 2021-11-16 DIAGNOSIS — Z91018 Allergy to other foods: Secondary | ICD-10-CM | POA: Diagnosis not present

## 2021-11-16 DIAGNOSIS — I10 Essential (primary) hypertension: Secondary | ICD-10-CM | POA: Diagnosis present

## 2021-11-16 DIAGNOSIS — F419 Anxiety disorder, unspecified: Secondary | ICD-10-CM | POA: Diagnosis present

## 2021-11-16 DIAGNOSIS — E669 Obesity, unspecified: Secondary | ICD-10-CM | POA: Diagnosis present

## 2021-11-16 DIAGNOSIS — Z20822 Contact with and (suspected) exposure to covid-19: Secondary | ICD-10-CM | POA: Diagnosis not present

## 2021-11-16 DIAGNOSIS — R69 Illness, unspecified: Secondary | ICD-10-CM | POA: Diagnosis not present

## 2021-11-16 LAB — BASIC METABOLIC PANEL
Anion gap: 10 (ref 5–15)
BUN: 19 mg/dL (ref 6–20)
CO2: 27 mmol/L (ref 22–32)
Calcium: 9.3 mg/dL (ref 8.9–10.3)
Chloride: 97 mmol/L — ABNORMAL LOW (ref 98–111)
Creatinine, Ser: 1.1 mg/dL (ref 0.61–1.24)
GFR, Estimated: >60 mL/min (ref >60)
Glucose, Bld: 115 mg/dL — ABNORMAL HIGH (ref 70–99)
Potassium: 4.1 mmol/L (ref 3.5–5.1)
Sodium: 134 mmol/L — ABNORMAL LOW (ref 135–145)

## 2021-11-16 LAB — URINALYSIS, ROUTINE W REFLEX MICROSCOPIC
Bilirubin Urine: NEGATIVE
Glucose, UA: 150 mg/dL — AB
Ketones, ur: 5 mg/dL — AB
Leukocytes,Ua: NEGATIVE
Nitrite: NEGATIVE
Protein, ur: 100 mg/dL — AB
Specific Gravity, Urine: 1.033 — ABNORMAL HIGH (ref 1.005–1.030)
pH: 5 (ref 5.0–8.0)

## 2021-11-16 LAB — CBC WITH DIFFERENTIAL/PLATELET
Abs Immature Granulocytes: 0.05 10*3/uL (ref 0.00–0.07)
Basophils Absolute: 0 10*3/uL (ref 0.0–0.1)
Basophils Relative: 0 %
Eosinophils Absolute: 0.2 10*3/uL (ref 0.0–0.5)
Eosinophils Relative: 2 %
HCT: 44.7 % (ref 39.0–52.0)
Hemoglobin: 15.8 g/dL (ref 13.0–17.0)
Immature Granulocytes: 1 %
Lymphocytes Relative: 19 %
Lymphs Abs: 2 10*3/uL (ref 0.7–4.0)
MCH: 31 pg (ref 26.0–34.0)
MCHC: 35.3 g/dL (ref 30.0–36.0)
MCV: 87.6 fL (ref 80.0–100.0)
Monocytes Absolute: 0.7 10*3/uL (ref 0.1–1.0)
Monocytes Relative: 6 %
Neutro Abs: 7.5 10*3/uL (ref 1.7–7.7)
Neutrophils Relative %: 72 %
Platelets: 270 10*3/uL (ref 150–400)
RBC: 5.1 MIL/uL (ref 4.22–5.81)
RDW: 11.9 % (ref 11.5–15.5)
WBC: 10.4 10*3/uL (ref 4.0–10.5)
nRBC: 0 % (ref 0.0–0.2)

## 2021-11-16 LAB — GLUCOSE, CAPILLARY: Glucose-Capillary: 195 mg/dL — ABNORMAL HIGH (ref 70–99)

## 2021-11-16 LAB — SARS CORONAVIRUS 2 BY RT PCR: SARS Coronavirus 2 by RT PCR: NEGATIVE

## 2021-11-16 LAB — RAPID URINE DRUG SCREEN, HOSP PERFORMED
Amphetamines: POSITIVE — AB
Barbiturates: NOT DETECTED
Benzodiazepines: NOT DETECTED
Cocaine: NOT DETECTED
Opiates: NOT DETECTED
Tetrahydrocannabinol: NOT DETECTED

## 2021-11-16 LAB — HEMOGLOBIN A1C
Hgb A1c MFr Bld: 5.9 % — ABNORMAL HIGH (ref 4.8–5.6)
Mean Plasma Glucose: 122.63 mg/dL

## 2021-11-16 LAB — CK: Total CK: >50000 U/L — ABNORMAL HIGH (ref 49–397)

## 2021-11-16 MED ORDER — SODIUM CHLORIDE 0.9 % IV SOLN: INTRAVENOUS | Status: DC

## 2021-11-16 MED ORDER — SODIUM CHLORIDE 0.9 % IV BOLUS
1000.0000 mL | Freq: Once | INTRAVENOUS | Status: AC
  Administered 2021-11-16: 1000 mL via INTRAVENOUS

## 2021-11-16 MED ORDER — NICOTINE 14 MG/24HR TD PT24
14.0000 mg | MEDICATED_PATCH | Freq: Every day | TRANSDERMAL | Status: DC
  Administered 2021-11-16 – 2021-11-19 (×4): 14 mg via TRANSDERMAL
  Filled 2021-11-16 (×4): qty 1

## 2021-11-16 MED ORDER — INSULIN ASPART 100 UNIT/ML IJ SOLN: 0.0000 [IU] | Freq: Every day | INTRAMUSCULAR | Status: DC

## 2021-11-16 MED ORDER — ACETAMINOPHEN 650 MG RE SUPP: 650.0000 mg | Freq: Four times a day (QID) | RECTAL | Status: DC | PRN

## 2021-11-16 MED ORDER — SODIUM BICARBONATE 8.4 % IV SOLN
INTRAVENOUS | Status: AC
  Filled 2021-11-16: qty 100

## 2021-11-16 MED ORDER — POLYETHYLENE GLYCOL 3350 17 G PO PACK
17.0000 g | PACK | Freq: Every day | ORAL | Status: DC | PRN
Start: 2021-11-16 — End: 2021-11-19

## 2021-11-16 MED ORDER — SODIUM CHLORIDE 0.45 % IV SOLN
INTRAVENOUS | Status: DC
  Filled 2021-11-16 (×12): qty 75

## 2021-11-16 MED ORDER — ONDANSETRON 8 MG PO TBDP
8.0000 mg | ORAL_TABLET | Freq: Once | ORAL | Status: AC
  Administered 2021-11-16: 8 mg via ORAL
  Filled 2021-11-16: qty 1

## 2021-11-16 MED ORDER — ONDANSETRON HCL 4 MG/2ML IJ SOLN: 4.0000 mg | Freq: Four times a day (QID) | INTRAMUSCULAR | Status: DC | PRN

## 2021-11-16 MED ORDER — INSULIN ASPART 100 UNIT/ML IJ SOLN
0.0000 [IU] | Freq: Three times a day (TID) | INTRAMUSCULAR | Status: DC
  Administered 2021-11-17: 2 [IU] via SUBCUTANEOUS
  Administered 2021-11-18 (×2): 1 [IU] via SUBCUTANEOUS

## 2021-11-16 MED ORDER — ENOXAPARIN SODIUM 40 MG/0.4ML IJ SOSY
40.0000 mg | PREFILLED_SYRINGE | INTRAMUSCULAR | Status: DC
  Administered 2021-11-16 – 2021-11-18 (×3): 40 mg via SUBCUTANEOUS
  Filled 2021-11-16 (×3): qty 0.4

## 2021-11-16 MED ORDER — ONDANSETRON HCL 4 MG PO TABS: 4.0000 mg | ORAL_TABLET | Freq: Four times a day (QID) | ORAL | Status: DC | PRN

## 2021-11-16 MED ORDER — ACETAMINOPHEN 325 MG PO TABS
650.0000 mg | ORAL_TABLET | Freq: Four times a day (QID) | ORAL | Status: DC | PRN
  Administered 2021-11-17: 650 mg via ORAL
  Filled 2021-11-16: qty 2

## 2021-11-16 NOTE — Assessment & Plan Note (Signed)
Smokes a pack to a pack and a half of cigarettes daily. -Consult to quit -Nicotine patch

## 2021-11-16 NOTE — H&P (Signed)
History and Physical    Willie Herman TKZ:[id number] DOB: [birth year] DOA: 11/16/2021  PCP: Pcp, No   Patient coming from: Home  I have personally briefly reviewed patient's old medical records in [place]  Chief Complaint: Body aches  HPI: Willie Herman is a 46 y.o. male with medical history significant for suicidal ideation, diabetes mellitus.  Patient presented to the ED with complaints of generalized body aches and fatigue of 2 days duration.  He also reports bilateral lower extremity pain.  Reports he is unable to walk because of the pain.  Normally he smokes meth amphetamine and discusses episodes of rhabdomyolysis.  But this time for the first time he injected methamphetamine into his right forearm.  Reports he has never injected before.  Was previously homeless now lives with a friend. Reports brownish discoloration to his urine.  No fevers no chills.  No cough no difficulty breathing.  No chest pains.  Denies use of any other illicit drugs.  Not taking any of his medication in about 8 to 9 months.  Denies alcohol intake.  Smokes a pack to a pack and a half of cigarettes daily.  ED Course: Stable vitals.  CK greater than 50,000.  Creatinine stable at 1.1.  UA not suggestive of infection, shows moderate hemoglobin.  Chest x-ray clear. 2 L bolus given.  Hospitalist to admit for rhabdomyolysis.  Review of Systems: As per HPI all other systems reviewed and negative.  Past Medical History:  Diagnosis Date   Anxiety    COPD (chronic obstructive pulmonary disease) ([place])    Depression    diet controlled diabetes    former   GERD (gastroesophageal reflux disease)    High cholesterol    Hypertension    Suicidal ideation     Past Surgical History:  Procedure Laterality Date   ABDOMINAL SURGERY     APPENDECTOMY     LEG SURGERY     when 12. Patient reports he was in tractor accident and leg had to be repaired   [place]       reports that he has  been smoking cigarettes. He has been smoking an average of 1.5 packs per day. He has never used smokeless tobacco. He reports that he does not currently use alcohol. He reports current drug use. Drug: Methamphetamines.  Allergies  Allergen Reactions   Pineapple Shortness Of Breath and Rash   Family history of hypertension  Prior to Admission medications   Medication Sig Start Date End Date Taking? Authorizing Provider  ARIPiprazole (ABILIFY) 15 MG tablet Take by mouth. Patient not taking: Reported on 11/26/2020 10/16/20   [provider]  escitalopram (LEXAPRO) 10 MG tablet Take 10 mg by mouth daily. 06/24/20   [provider]  gabapentin (NEURONTIN) 300 MG capsule Take 600 mg by mouth 3 (three) times daily.    [provider]  ibuprofen (ADVIL) 200 MG tablet Take by mouth. Patient not taking: Reported on 11/26/2020    [provider]  ibuprofen (ADVIL) 800 MG tablet Take 1 tablet (800 mg total) by mouth every 8 (eight) hours as needed for moderate pain. 11/26/20   Rancour, Annie Main, MD  metFORMIN (GLUCOPHAGE) 500 MG tablet Take 1 tablet (500 mg total) by mouth daily with breakfast. Patient not taking: No sig reported 05/15/20   Ethelene Hal, NP  metFORMIN (GLUCOPHAGE) 500 MG tablet Take by mouth. Patient not taking: Reported on 11/26/2020 05/15/20   [provider]  nicotine (NICODERM CQ - DOSED IN MG/24 HOURS) 14 mg/24hr patch Place onto the skin. Patient not taking: Reported on 11/26/2020    [provider]  pantoprazole (PROTONIX) 20 MG tablet Take 1 tablet by mouth daily. Patient not taking: Reported on 11/26/2020    [provider]  traZODone (DESYREL) 50 MG tablet Take 50 mg by mouth at bedtime. 10/16/20   [provider]  blood glucose meter kit and supplies KIT Dispense based on patient and insurance preference. Use up to four times daily as directed. (FOR ICD-9 250.00, 250.01). 05/14/20 09/21/20  Ethelene Hal, NP     Physical Exam: Vitals:   11/16/21 1149 11/16/21 1150 11/16/21 1509  BP: (!) 140/97  (!) 147/100  Pulse: 85  77  Resp: 20  19  Temp: (!) 97.3 F (36.3 C)  98.3 F (36.8 C)  TempSrc: Oral  Oral  SpO2: 99%  100%  Weight:  102.1 kg   Height:  6' 1" (1.854 m)     Constitutional: NAD, calm, comfortable Vitals:   11/16/21 1149 11/16/21 1150 11/16/21 1509  BP: (!) 140/97  (!) 147/100  Pulse: 85  77  Resp: 20  19  Temp: (!) 97.3 F (36.3 C)  98.3 F (36.8 C)  TempSrc: Oral  Oral  SpO2: 99%  100%  Weight:  102.1 kg   Height:  6' 1" (1.854 m)    Eyes: PERRL, lids and conjunctivae normal ENMT: Mucous membranes are moist.   Neck: normal, supple, no masses, no thyromegaly Respiratory: clear to auscultation bilaterally, no wheezing, no crackles. Normal respiratory effort. No accessory muscle use.  Cardiovascular: Regular rate and rhythm, no murmurs / rubs / gallops. No extremity edema. 2+ pedal pulses. No carotid bruits.  Abdomen: no tenderness, no masses palpated. No hepatosplenomegaly. Bowel sounds positive.  Musculoskeletal: no clubbing / cyanosis. No joint deformity upper and lower extremities.  Skin: Few scratches on bilateral lower extremities from briars, per patient.   Neurologic: No apparent cranial nerve abnormality moving extremity spontaneously Psychiatric: Normal judgment and insight. Alert and oriented x 3. Normal mood.   Labs on Admission: I have personally reviewed following labs and imaging studies  CBC: Recent Labs  Lab 11/16/21 1341  WBC 10.4  NEUTROABS 7.5  HGB 15.8  HCT 44.7  MCV 87.6  PLT 527   Basic Metabolic Panel: Recent Labs  Lab 11/16/21 1341  NA 134*  K 4.1  CL 97*  CO2 27  GLUCOSE 115*  BUN 19  CREATININE 1.10  CALCIUM 9.3   Cardiac Enzymes: Recent Labs  Lab 11/16/21 1341  CKTOTAL >50,000*   Urine analysis:    Component Value Date/Time   COLORURINE YELLOW 11/16/2021 1438   APPEARANCEUR HAZY (A) 11/16/2021 1438   LABSPEC  1.033 (H) 11/16/2021 1438   PHURINE 5.0 11/16/2021 1438   GLUCOSEU 150 (A) 11/16/2021 1438   HGBUR MODERATE (A) 11/16/2021 1438   BILIRUBINUR NEGATIVE 11/16/2021 1438   KETONESUR 5 (A) 11/16/2021 1438   PROTEINUR 100 (A) 11/16/2021 1438   UROBILINOGEN 0.2 04/18/2012 1900   NITRITE NEGATIVE 11/16/2021 1438   LEUKOCYTESUR NEGATIVE 11/16/2021 1438    Radiological Exams on Admission: DG Chest Portable 1 View  Result Date: 11/16/2021 CLINICAL DATA:  Cough. EXAM: PORTABLE CHEST 1 VIEW COMPARISON:  November 26, 2020. FINDINGS: The heart size and mediastinal contours are within normal limits. Both lungs are clear. The visualized skeletal structures are unremarkable. IMPRESSION: No active disease. Electronically Signed   By:  Marijo Conception M.D.   On: 11/16/2021 12:29    EKG: none   Assessment/Plan Principal Problem:   Rhabdomyolysis Active Problems:   Diabetes mellitus (HCC)   MDD (major depressive disorder)   Tobacco abuse   Substance induced mood disorder (HCC)  Assessment and Plan: * Rhabdomyolysis CK severely elevated at > 50,000.  Fortunately his creatinine is stable at 1.1.  Serum bicarb is 27.  Likely secondary to methamphetamine injection.  Patient reports first time injecting meth, on previous occasions he smoked meth.  Patient reports this is his 5th hospitalization for rhabdomyolysis related to his meth use.  Highest CK from prior elevations- 9,598-  04/2020. - 3rd liter of normal saline bolus to be given - Start Bicarb drip @ 125cc/hr to alkalinize urine -UDS -Daily CK -Daily BMP -CK may continue to rise by a.m., may take several days for rhabdo to resolve  Diabetes mellitus ([place]) Supposed to be on metformin has not taken in several months. - SSi- S - HgbA1c  Substance induced mood disorder ([place])    Tobacco abuse Smokes a pack to a pack and a half of cigarettes daily. -Consult to quit -Nicotine patch  MDD (major depressive disorder) Depression and  substance-induced mood disorder.  Has not taken any of his medications in several months. -Hold aripiprazole gabapentin and Lexapro.   DVT prophylaxis:  Lovenox Code Status:  Full Family Communication:  None at bedside Disposition Plan: > 2 days Consults called: None  Admission status: Inpt tele  I certify that at the point of admission it is my clinical judgment that the patient will require inpatient hospital care spanning beyond 2 midnights from the point of admission due to high intensity of service, high risk for further deterioration and high frequency of surveillance required.    Author: Bethena Roys, MD 11/16/2021 6:34 PM  For on call review [email or web address].

## 2021-11-16 NOTE — ED Notes (Signed)
AC called for Sodium bicarbonate in sodium chloride 0.45%--stated she will go to pharmacy and bring it down

## 2021-11-16 NOTE — Assessment & Plan Note (Signed)
Supposed to be on metformin has not taken in several months. - SSi- S - HgbA1c

## 2021-11-16 NOTE — ED Provider Notes (Signed)
[facility] EMERGENCY DEPARTMENT Provider Note   CSN: [id number] Arrival date & time: 11/16/21  1016     History  Chief Complaint  Patient presents with   Generalized Body Aches    HPI Willie Herman is a 46 y.o. male with past medical history of rhabdomyolysis x4 presenting today for generalized body aches.  States that this started this morning after he awoke from sleep.  Endorses lethargy and "sore all over".  Also states that he has had bilateral leg tenderness and pain making it difficult to walk.  Denies shortness of breath, chest pain, fever, sick contacts, neck stiffness, vomiting, and diarrhea.  Does endorse nausea.  Has not taken any medication to make his symptoms better.  Reports that he has had rhabdo myolysis 4 times which required admission to the hospital for treatment.  Also reports history of crystal meth use.  He used crystal meth last night.        Home Medications Prior to Admission medications   Medication Sig Start Date End Date Taking? Authorizing Provider  ARIPiprazole (ABILIFY) 15 MG tablet Take by mouth. Patient not taking: Reported on 11/26/2020 10/16/20   [provider]  escitalopram (LEXAPRO) 10 MG tablet Take 10 mg by mouth daily. 06/24/20   [provider]  gabapentin (NEURONTIN) 300 MG capsule Take 600 mg by mouth 3 (three) times daily.    [provider]  ibuprofen (ADVIL) 200 MG tablet Take by mouth. Patient not taking: Reported on 11/26/2020    [provider]  ibuprofen (ADVIL) 800 MG tablet Take 1 tablet (800 mg total) by mouth every 8 (eight) hours as needed for moderate pain. 11/26/20   Rancour, Annie Main, MD  metFORMIN (GLUCOPHAGE) 500 MG tablet Take 1 tablet (500 mg total) by mouth daily with breakfast. Patient not taking: No sig reported 05/15/20   Ethelene Hal, NP  metFORMIN (GLUCOPHAGE) 500 MG tablet Take by mouth. Patient not taking: Reported on 11/26/2020 05/15/20   [provider]  nicotine  (NICODERM CQ - DOSED IN MG/24 HOURS) 14 mg/24hr patch Place onto the skin. Patient not taking: Reported on 11/26/2020    [provider]  pantoprazole (PROTONIX) 20 MG tablet Take 1 tablet by mouth daily. Patient not taking: Reported on 11/26/2020    [provider]  traZODone (DESYREL) 50 MG tablet Take 50 mg by mouth at bedtime. 10/16/20   [provider]  blood glucose meter kit and supplies KIT Dispense based on patient and insurance preference. Use up to four times daily as directed. (FOR ICD-9 250.00, 250.01). 05/14/20 09/21/20  Ethelene Hal, NP      Allergies    Pineapple    Review of Systems   See HPI for pertinent ROS  Physical Exam Updated Vital Signs BP (!) 147/100 (BP Location: Left Arm)   Pulse 77   Temp 98.3 F (36.8 C) (Oral)   Resp 19   Ht '6\' 1"'  (1.854 m)   Wt 102.1 kg   SpO2 100%   BMI 29.69 kg/m  Physical Exam Vitals and nursing note reviewed.  Constitutional:      Appearance: Normal appearance.  HENT:     Head: Normocephalic and atraumatic.     Mouth/Throat:     Mouth: Mucous membranes are moist.  Eyes:     General:        Right eye: No discharge.        Left eye: No discharge.     Conjunctiva/sclera: Conjunctivae normal.  Cardiovascular:     Rate and Rhythm: Normal rate and regular rhythm.     Pulses: Normal pulses.          Radial pulses are 2+ on the right side and 2+ on the left side.       Dorsalis pedis pulses are 2+ on the right side and 2+ on the left side.     Heart sounds: Normal heart sounds.  Pulmonary:     Effort: Pulmonary effort is normal.     Breath sounds: Normal breath sounds.  Abdominal:     General: Abdomen is flat.     Palpations: Abdomen is soft.  Musculoskeletal:     Right lower leg: Normal. No swelling. No edema.     Left lower leg: Normal. No swelling. No edema.  Skin:    General: Skin is warm and dry.  Neurological:     General: No focal deficit present.     Mental Status: He is alert.   Psychiatric:        Mood and Affect: Mood normal.     ED Results / Procedures / Treatments   Labs (all labs ordered are listed, but only abnormal results are displayed) Labs Reviewed  BASIC METABOLIC PANEL - Abnormal; Notable for the following components:      Result Value   Sodium 134 (*)    Chloride 97 (*)    Glucose, Bld 115 (*)    All other components within normal limits  CK - Abnormal; Notable for the following components:   Total CK >50,000 (*)    All other components within normal limits  URINALYSIS, ROUTINE W REFLEX MICROSCOPIC - Abnormal; Notable for the following components:   APPearance HAZY (*)    Specific Gravity, Urine 1.033 (*)    Glucose, UA 150 (*)    Hgb urine dipstick MODERATE (*)    Ketones, ur 5 (*)    Protein, ur 100 (*)    Bacteria, UA RARE (*)    All other components within normal limits  SARS CORONAVIRUS 2 BY RT PCR  CBC WITH DIFFERENTIAL/PLATELET  RAPID URINE DRUG SCREEN, HOSP PERFORMED  HEMOGLOBIN A1C    EKG None  Radiology DG Chest Portable 1 View  Result Date: 11/16/2021 CLINICAL DATA:  Cough. EXAM: PORTABLE CHEST 1 VIEW COMPARISON:  November 26, 2020. FINDINGS: The heart size and mediastinal contours are within normal limits. Both lungs are clear. The visualized skeletal structures are unremarkable. IMPRESSION: No active disease. Electronically Signed   By: Marijo Conception M.D.   On: 11/16/2021 12:29    Procedures Procedures    Medications Ordered in ED Medications  0.9 %  sodium chloride infusion (has no administration in time range)  sodium chloride 0.9 % bolus 1,000 mL (0 mLs Intravenous Stopped 11/16/21 1437)  ondansetron (ZOFRAN-ODT) disintegrating tablet 8 mg (8 mg Oral Given 11/16/21 1332)  sodium chloride 0.9 % bolus 1,000 mL (1,000 mLs Intravenous Bolus 11/16/21 1735)    ED Course/ Medical Decision Making/ A&P Clinical Course as of 11/16/21 1757  Thu Nov 16, 2021  1531 DG Chest Portable 1 View [JR]    Clinical Course  User Index [JR] Harriet Pho, PA-C                           Medical Decision Making Amount and/or Complexity of Data Reviewed Labs: ordered. Radiology: ordered. Decision-making details documented in ED Course.  Risk Prescription drug management.  This patient presents to the ED for concern of body aches, this involves a number of treatment options, and is a complaint that carries with it a moderate risk of complications and morbidity.  The differential diagnosis includes COVID infection, flu, and rhabdomyolysis.   Co morbidities: Discussed in HPI   EMR reviewed including pt PMHx, past surgical history and past visits to ER.   See HPI for more details   Lab Tests:   I ordered and independently interpreted labs. Labs notable for severely elevated CK and hematuria   Imaging Studies:  NAD. I personally reviewed all imaging studies and no acute abnormality found. I agree with radiology interpretation.    Cardiac Monitoring:  The patient was maintained on a cardiac monitor.  I personally viewed and interpreted the cardiac monitored which showed an underlying rhythm of:NSR NA   Medicines ordered:  I ordered medication including Zofran for nausea and 2 L normal saline for concern for rhabdomyolysis. Reevaluation of the patient after these medicines showed that the patient improved I have reviewed the patients home medicines and have made adjustments as needed    Consults/Attending Physician   I requested consultation with Dr. Denton Brick,  and discussed lab and imaging findings as well as pertinent plan - they recommend: admission to the hospital for treatment of rhabdomyolysis   Reevaluation:  After the interventions noted above I re-evaluated patient and found that they have :improved       Problem List / ED Course: Patient presented for generalized body aches.  Also endorsed bilateral calf tenderness and multiple occurrences of rhabdomyolysis status  post crystal meth use.  This presentation was concerning for rhabdomyolysis. Labs revealed severely elevated CK. Discussed with hospital team who advised to admit to hospital for ongoing management.    Dispostion:  After consideration of the diagnostic results and the patients response to treatment, I feel that the patent would benefit from admission to the hospital for ongoing treatment for active rhabdomyolysis.         Final Clinical Impression(s) / ED Diagnoses Final diagnoses:  Non-traumatic rhabdomyolysis    Rx / DC Orders ED Discharge Orders     None         Harriet Pho, PA-C 11/16/21 Barth Kirks, MD 11/16/21 [phone]

## 2021-11-16 NOTE — Assessment & Plan Note (Addendum)
CK severely elevated at > 50,000.  Fortunately his creatinine is stable at 1.1.  Serum bicarb is 27.  Likely secondary to methamphetamine injection.  Patient reports first time injecting meth, on previous occasions he smoked meth.  Patient reports this is his 5th hospitalization for rhabdomyolysis related to his meth use.  Highest CK from prior elevations- 9,598-  04/2020. - 3rd liter of normal saline bolus to be given - Start Bicarb drip @ 125cc/hr to alkalinize urine -UDS -Daily CK -Daily BMP -CK may continue to rise by a.m., may take several days for rhabdo to resolve

## 2021-11-16 NOTE — ED Triage Notes (Signed)
Pt presents with body aches and fatigue x 2 days, denies being around anyone sick.

## 2021-11-16 NOTE — Assessment & Plan Note (Signed)
Depression and substance-induced mood disorder.  Has not taken any of his medications in several months. -Hold aripiprazole gabapentin and Lexapro.

## 2021-11-17 LAB — BASIC METABOLIC PANEL
Anion gap: 2 — ABNORMAL LOW (ref 5–15)
BUN: 13 mg/dL (ref 6–20)
CO2: 26 mmol/L (ref 22–32)
Calcium: 7.6 mg/dL — ABNORMAL LOW (ref 8.9–10.3)
Chloride: 107 mmol/L (ref 98–111)
Creatinine, Ser: 0.81 mg/dL (ref 0.61–1.24)
GFR, Estimated: >60 mL/min (ref >60)
Glucose, Bld: 148 mg/dL — ABNORMAL HIGH (ref 70–99)
Potassium: 3.7 mmol/L (ref 3.5–5.1)
Sodium: 135 mmol/L (ref 135–145)

## 2021-11-17 LAB — GLUCOSE, CAPILLARY
Glucose-Capillary: 173 mg/dL — ABNORMAL HIGH (ref 70–99)
Glucose-Capillary: 104 mg/dL — ABNORMAL HIGH (ref 70–99)
Glucose-Capillary: 105 mg/dL — ABNORMAL HIGH (ref 70–99)
Glucose-Capillary: 112 mg/dL — ABNORMAL HIGH (ref 70–99)

## 2021-11-17 LAB — CK: Total CK: 29650 U/L — ABNORMAL HIGH (ref 49–397)

## 2021-11-17 LAB — HIV ANTIBODY (ROUTINE TESTING W REFLEX): HIV Screen 4th Generation wRfx: NONREACTIVE

## 2021-11-17 MED ORDER — SODIUM BICARBONATE 8.4 % IV SOLN
INTRAVENOUS | Status: AC
  Filled 2021-11-17: qty 100

## 2021-11-17 MED ORDER — TRAZODONE HCL 50 MG PO TABS
100.0000 mg | ORAL_TABLET | Freq: Every day | ORAL | Status: DC
  Administered 2021-11-17 – 2021-11-18 (×2): 100 mg via ORAL
  Filled 2021-11-17 (×2): qty 2

## 2021-11-17 MED ORDER — GABAPENTIN 300 MG PO CAPS
300.0000 mg | ORAL_CAPSULE | Freq: Three times a day (TID) | ORAL | Status: DC
Start: 2021-11-17 — End: 2021-11-19
  Administered 2021-11-17 – 2021-11-19 (×6): 300 mg via ORAL
  Filled 2021-11-17 (×6): qty 1

## 2021-11-17 MED ORDER — METHOCARBAMOL 500 MG PO TABS
750.0000 mg | ORAL_TABLET | Freq: Four times a day (QID) | ORAL | Status: DC
  Administered 2021-11-17 – 2021-11-19 (×8): 750 mg via ORAL
  Filled 2021-11-17 (×8): qty 2

## 2021-11-17 MED ORDER — HYDROXYZINE HCL 25 MG PO TABS
25.0000 mg | ORAL_TABLET | Freq: Three times a day (TID) | ORAL | Status: DC
  Administered 2021-11-17 – 2021-11-19 (×6): 25 mg via ORAL
  Filled 2021-11-17 (×6): qty 1

## 2021-11-17 MED ORDER — LORAZEPAM 2 MG/ML IJ SOLN
1.0000 mg | Freq: Once | INTRAMUSCULAR | Status: AC
  Administered 2021-11-17: 1 mg via INTRAVENOUS
  Filled 2021-11-17: qty 1

## 2021-11-17 NOTE — Progress Notes (Signed)
PROGRESS NOTE     Willie Herman, is a 46 y.o. male, DOB - Jan 31, 1976, XLK:[id number]  Admit date - 11/16/2021   Admitting Physician Willie Boer, MD  Outpatient Primary MD for the patient is Willie Herman, No  LOS - 1  Chief Complaint  Patient presents with   Generalized Body Aches        Brief Narrative:  46 y.o. male with medical history significant for suicidal ideation, diabetes mellitus and polysubstance abuse including methamphetamine admitted on 11/16/2021 with severely elevated CKs in the setting of methamphetamine use    -Assessment and Plan: 1)Rhabdomyolysis Patient has repeated episodes (at least 5 times) in the past of elevated CKs in the setting of methamphetamine use this is not unusual for him  -Patient use methamphetamine IV on 11/14/2021  -CK on admission where severely elevated at > 50,000. -With aggressive hydration and bicarb drips CK currently down to 29,650 -Bicarb is currently 26 -Continue IV bicarb drip to alkalinize urine urine and help with excretion of CKs -Continue to maintain adequate hydration  Diabetes mellitus (HCC) .-A1c is 5.9 reflecting excellent diabetic control PTA -Was supposed to be on metformin, prior to admission was noncompliant, will not start this now Use Novolog/Humalog Sliding scale insulin with Accu-Cheks/Fingersticks as ordered   Substance induced mood disorder /methamphetamine abuse/history of MDD -Okay to give trazodone nightly, -Hydroxyzine 25 mg 3 times daily, -Gabapentin 300 3 times daily and methocarbamol 750 mg 4 times daily to blunt withdrawal effects -Abilify and Lexapro remains on hold (prior to admission patient was noncompliant with either )  Tobacco abuse -Nicotine patch as advised -Patient is not ready to quit smoking  Class II obesity- -Low calorie diet, portion control and increase physical activity discussed with patient -Body mass index is 30.66 kg/m.  Disposition/Need for in-Hospital Stay- patient unable to be  discharged at this time due to --severe elevation of CK with risk for renal injury... Requiring aggressive IV hydration/bicarb drip  Status is: Inpatient   Disposition: The patient is from: Home              Anticipated d/c is to: Home              Anticipated d/c date is: 2 days              Patient currently is not medically stable to d/c. Barriers: Not Clinically Stable-   Code Status :  -  Code Status: Full Code   Family Communication:    NA (patient is alert, awake and coherent)   DVT Prophylaxis  :   - SCDs enoxaparin (LOVENOX) injection 40 mg Start: 11/16/21 2200   Lab Results  Component Value Date   PLT 270 11/16/2021    Inpatient Medications  Scheduled Meds:  enoxaparin (LOVENOX) injection  40 mg Subcutaneous Q24H   insulin aspart  0-5 Units Subcutaneous QHS   insulin aspart  0-9 Units Subcutaneous TID WC   nicotine  14 mg Transdermal Daily   Continuous Infusions:  sodium bicarbonate 75 mEq in sodium chloride 0.45 % 1,075 mL infusion 125 mL/hr at 11/17/21 0649   PRN Meds:.acetaminophen **OR** acetaminophen, ondansetron **OR** ondansetron (ZOFRAN) IV, polyethylene glycol   Anti-infectives (From admission, onward)    None         Subjective: Willie Herman today has no fevers, no emesis,  No chest pain,  -Threatening to leave AMA -Somewhat anxious and restless -Complains of myalgias  Objective: Vitals:   11/16/21 2010 11/16/21 2030 11/16/21 2131 11/17/21  0610  BP: (!) 141/84  129/74 105/76  Pulse: 69  67 63  Resp: 18  20 18   Temp: 98 F (36.7 C)  98 F (36.7 C) 97.9 F (36.6 C)  TempSrc:    Oral  SpO2: 99% 97% 98% 97%  Weight:   99.7 kg   Height:   5\' 11"  (1.803 m)     Intake/Output Summary (Last 24 hours) at 11/17/2021 1157 Last data filed at 11/17/2021 0900 Gross per 24 hour  Intake 1138.67 ml  Output 1 ml  Net 1137.67 ml   Filed Weights   11/16/21 1150 11/16/21 2131  Weight: 102.1 kg 99.7 kg    Physical Exam  Gen:- Awake Alert, no  acute distress HEENT:- [place].AT, No sclera icterus Neck-Supple Neck,No JVD,.  Lungs-  CTAB , fair symmetrical air movement CV- S1, S2 normal, regular  Abd-  +ve B.Sounds, Abd Soft, No tenderness,    Extremity/Skin:- No  edema, pedal pulses present  Psych-affect is and shortness, oriented x3 Neuro-no new focal deficits, no tremors  Data Reviewed: I have personally reviewed following labs and imaging studies  CBC: Recent Labs  Lab 11/16/21 1341  WBC 10.4  NEUTROABS 7.5  HGB 15.8  HCT 44.7  MCV 87.6  PLT 270   Basic Metabolic Panel: Recent Labs  Lab 11/16/21 1341 11/17/21 0338  NA 134* 135  K 4.1 3.7  CL 97* 107  CO2 27 26  GLUCOSE 115* 148*  BUN 19 13  CREATININE 1.10 0.81  CALCIUM 9.3 7.6*   GFR: Estimated Creatinine Clearance: 137.2 mL/min (by C-G formula based on SCr of 0.81 mg/dL).  Cardiac Enzymes: Recent Labs  Lab 11/16/21 1341 11/17/21 0338  CKTOTAL >50,000* 29,650*   BNP (last 3 results) No results for input(s): "PROBNP" in the last 8760 hours. HbA1C: Recent Labs    11/16/21 1341  HGBA1C 5.9*   Recent Results (from the past 240 hour(s))  SARS Coronavirus 2 by RT PCR (hospital order, performed in [facility] hospital lab) *cepheid single result test* Anterior Nasal Swab     Status: None   Collection Time: 11/16/21 11:57 AM   Specimen: Anterior Nasal Swab  Result Value Ref Range Status   SARS Coronavirus 2 by RT PCR NEGATIVE NEGATIVE Final    Comment: (NOTE) SARS-CoV-2 target nucleic acids are NOT DETECTED.  The SARS-CoV-2 RNA is generally detectable in upper and lower respiratory specimens during the acute phase of infection. The lowest concentration of SARS-CoV-2 viral copies this assay can detect is 250 copies / mL. A negative result does not preclude SARS-CoV-2 infection and should not be used as the sole basis for treatment or other patient management decisions.  A negative result may occur with improper specimen collection / handling,  submission of specimen other than nasopharyngeal swab, presence of viral mutation(s) within the areas targeted by this assay, and inadequate number of viral copies (<250 copies / mL). A negative result must be combined with clinical observations, patient history, and epidemiological information.  Fact Sheet for Patients:   [facility]  Fact Sheet for Healthcare Providers: 11/18/21  This test is not yet approved or  cleared by the [employer] FDA and has been authorized for detection and/or diagnosis of SARS-CoV-2 by FDA under an Emergency Use Authorization (EUA).  This EUA will remain in effect (meaning this test can be used) for the duration of the COVID-19 declaration under Section 564(b)(1) of the Act, 21 U.S.C. section 360bbb-3(b)(1), unless the authorization is terminated or revoked sooner.  Performed at [facility], [address] [place], [place] [address]      Radiology Studies: DG Chest Portable 1 View  Result Date: 11/16/2021 CLINICAL DATA:  Cough. EXAM: PORTABLE CHEST 1 VIEW COMPARISON:  November 26, 2020. FINDINGS: The heart size and mediastinal contours are within normal limits. Both lungs are clear. The visualized skeletal structures are unremarkable. IMPRESSION: No active disease. Electronically Signed   By: Lupita Raider M.D.   On: 11/16/2021 12:29     Scheduled Meds:  enoxaparin (LOVENOX) injection  40 mg Subcutaneous Q24H   insulin aspart  0-5 Units Subcutaneous QHS   insulin aspart  0-9 Units Subcutaneous TID WC   nicotine  14 mg Transdermal Daily   Continuous Infusions:  sodium bicarbonate 75 mEq in sodium chloride 0.45 % 1,075 mL infusion 125 mL/hr at 11/17/21 0649    LOS: 1 day   Shon Hale M.D on 11/17/2021 at 11:57 AM  Go to [email or web address] - for contact info  [facility] - Office  [phone]  If 7PM-7AM, please contact night-coverage [email or web address] 11/17/2021, 11:57  AM

## 2021-11-17 NOTE — Progress Notes (Signed)
Pt asking about leaving AMA. MD made award.

## 2021-11-17 NOTE — Plan of Care (Signed)
  Problem: Health Behavior/Discharge Planning: Goal: Ability to identify and utilize available resources and services will improve Outcome: Progressing Goal: Ability to manage health-related needs will improve Outcome: Progressing

## 2021-11-17 NOTE — Progress Notes (Signed)
Patient admitted this shift to 300 floor. Patient received first bag of sodium bicarbonate in 0.45 sodium chloride. Patient slept the whole night with no complaints of pain. Till this morning this nurse went in to get patient vitals and hang new bag of sodium bicarb, Patient stated he awoke with some stiffness and soreness in both arms. Will let on coming nurse know and MD.

## 2021-11-17 NOTE — Progress Notes (Signed)
  Transition of Care [facility]) Screening Note   Patient Details  Name: Willie Herman Date of Birth: 1975-04-23   Transition of Care [facility]) CM/SW Contact:    Annice Needy, LCSW Phone Number: 11/17/2021, 9:40 AM    Transition of Care Department [facility]) has reviewed patient and no TOC needs have been identified at this time. We will continue to monitor patient advancement through interdisciplinary progression rounds. If new patient transition needs arise, please place a TOC consult.

## 2021-11-18 LAB — GLUCOSE, CAPILLARY
Glucose-Capillary: 146 mg/dL — ABNORMAL HIGH (ref 70–99)
Glucose-Capillary: 150 mg/dL — ABNORMAL HIGH (ref 70–99)
Glucose-Capillary: 87 mg/dL (ref 70–99)
Glucose-Capillary: 125 mg/dL — ABNORMAL HIGH (ref 70–99)

## 2021-11-18 LAB — BASIC METABOLIC PANEL
Anion gap: 3 — ABNORMAL LOW (ref 5–15)
BUN: 8 mg/dL (ref 6–20)
CO2: 26 mmol/L (ref 22–32)
Calcium: 7.9 mg/dL — ABNORMAL LOW (ref 8.9–10.3)
Chloride: 107 mmol/L (ref 98–111)
Creatinine, Ser: 0.74 mg/dL (ref 0.61–1.24)
GFR, Estimated: >60 mL/min (ref >60)
Glucose, Bld: 106 mg/dL — ABNORMAL HIGH (ref 70–99)
Potassium: 3.7 mmol/L (ref 3.5–5.1)
Sodium: 136 mmol/L (ref 135–145)

## 2021-11-18 LAB — CK: Total CK: 14550 U/L — ABNORMAL HIGH (ref 49–397)

## 2021-11-18 NOTE — Plan of Care (Signed)
  Problem: Education: Goal: Ability to describe self-care measures that may prevent or decrease complications (Diabetes Survival Skills Education) will improve Outcome: Progressing Goal: Individualized Educational Video(s) Outcome: Progressing   Problem: Coping: Goal: Ability to adjust to condition or change in health will improve Outcome: Progressing   Problem: Fluid Volume: Goal: Ability to maintain a balanced intake and output will improve Outcome: Progressing   Problem: Health Behavior/Discharge Planning: Goal: Ability to identify and utilize available resources and services will improve Outcome: Progressing Goal: Ability to manage health-related needs will improve Outcome: Progressing   Problem: Metabolic: Goal: Ability to maintain appropriate glucose levels will improve Outcome: Progressing   Problem: Nutritional: Goal: Maintenance of adequate nutrition will improve Outcome: Progressing Goal: Progress toward achieving an optimal weight will improve Outcome: Progressing   Problem: Skin Integrity: Goal: Risk for impaired skin integrity will decrease Outcome: Progressing   Problem: Tissue Perfusion: Goal: Adequacy of tissue perfusion will improve Outcome: Progressing   Problem: Education: Goal: Knowledge of General Education information will improve Description: Including pain rating scale, medication(s)/side effects and non-pharmacologic comfort measures Outcome: Progressing   Problem: Health Behavior/Discharge Planning: Goal: Ability to manage health-related needs will improve Outcome: Progressing   Problem: Clinical Measurements: Goal: Ability to maintain clinical measurements within normal limits will improve Outcome: Progressing

## 2021-11-18 NOTE — Progress Notes (Signed)
PROGRESS NOTE     Willie Herman, is a 46 y.o. male, DOB - 02-01-1976, URK:[id number]  Admit date - 11/16/2021   Admitting Physician Onnie Boer, MD  Outpatient Primary MD for the patient is Pcp, No  LOS - 2  Chief Complaint  Patient presents with   Generalized Body Aches        Brief Narrative:  46 y.o. male with medical history significant for suicidal ideation, diabetes mellitus and polysubstance abuse including methamphetamine admitted on 11/16/2021 with severely elevated CKs in the setting of methamphetamine use    -Assessment and Plan: 1)Rhabdomyolysis Patient has repeated episodes (at least 5 times) in the past of elevated CKs in the setting of methamphetamine use this is not unusual for him  -Patient use methamphetamine IV on 11/14/2021  -CK on admission where severely elevated at > 50,000. -With aggressive hydration and bicarb drips CK currently down to 14,550 (50,000 >>29,650 >>14,550) -Bicarb is currently 26 -Continue IV bicarb drip to alkalinize urine urine and help with excretion of CKs -Continue to maintain adequate hydration  Diabetes mellitus (HCC) .-A1c is 5.9 reflecting excellent diabetic control PTA -Was supposed to be on metformin, prior to admission was noncompliant, will not start this now Use Novolog/Humalog Sliding scale insulin with Accu-Cheks/Fingersticks as ordered   Substance induced mood disorder /methamphetamine abuse/history of MDD -Continue trazodone nightly, -Okay to use-Hydroxyzine 25 mg 3 times daily, -Continue-Gabapentin 300 3 times daily and methocarbamol 750 mg 4 times daily to blunt withdrawal effects -Abilify and Lexapro remains on hold (prior to admission patient was noncompliant with either )  Tobacco abuse -Nicotine patch as advised -Patient is not ready to quit smoking  Class II obesity- -Low calorie diet, portion control and increase physical activity discussed with patient -Body mass index is 30.66  kg/m.  Disposition/Need for in-Hospital Stay- patient unable to be discharged at this time due to --severe elevation of CK with risk for renal injury... Requiring aggressive IV hydration/bicarb drip- -possible discharge home in 1 to 2 days if continues to improve  Status is: Inpatient   Disposition: The patient is from: Home              Anticipated d/c is to: Home              Anticipated d/c date is: 2 days              Patient currently is not medically stable to d/c. Barriers: Not Clinically Stable-   Code Status :  -  Code Status: Full Code   Family Communication:    NA (patient is alert, awake and coherent)   DVT Prophylaxis  :   - SCDs enoxaparin (LOVENOX) injection 40 mg Start: 11/16/21 2200   Lab Results  Component Value Date   PLT 270 11/16/2021   Inpatient Medications  Scheduled Meds:  enoxaparin (LOVENOX) injection  40 mg Subcutaneous Q24H   gabapentin  300 mg Oral TID   hydrOXYzine  25 mg Oral TID   insulin aspart  0-5 Units Subcutaneous QHS   insulin aspart  0-9 Units Subcutaneous TID WC   methocarbamol  750 mg Oral QID   nicotine  14 mg Transdermal Daily   traZODone  100 mg Oral QHS   Continuous Infusions:  sodium bicarbonate 75 mEq in sodium chloride 0.45 % 1,075 mL infusion 125 mL/hr at 11/18/21 1733   PRN Meds:.acetaminophen **OR** acetaminophen, ondansetron **OR** ondansetron (ZOFRAN) IV, polyethylene glycol   Anti-infectives (From admission, onward)  None       Subjective: Willie Herman today has no fevers, no emesis,  No chest pain,  -Much less anxious -Patient states that myalgias are improving   Objective: Vitals:   11/17/21 0610 11/17/21 1352 11/17/21 2216 11/18/21 1215  BP: 105/76 121/87 128/78 (!) 143/79  Pulse: 63 66 72 68  Resp: 18 16 20 20   Temp: 97.9 F (36.6 C) 98.8 F (37.1 C) 98 F (36.7 C) 98.6 F (37 C)  TempSrc: Oral  Oral Oral  SpO2: 97% 100% 97% 91%  Weight:      Height:        Intake/Output Summary (Last 24  hours) at 11/18/2021 1859 Last data filed at 11/18/2021 1700 Gross per 24 hour  Intake 3234.28 ml  Output --  Net 3234.28 ml   Filed Weights   11/16/21 1150 11/16/21 2131  Weight: 102.1 kg 99.7 kg    Physical Exam  Gen:- Awake Alert, no acute distress HEENT:- [place].AT, No sclera icterus Neck-Supple Neck,No JVD,.  Lungs-  CTAB , fair symmetrical air movement CV- S1, S2 normal, regular  Abd-  +ve B.Sounds, Abd Soft, No tenderness,    Extremity/Skin:- No  edema, pedal pulses present  Psych-affect is and shortness, oriented x3 Neuro-generalized weakness, no new focal deficits, no tremors  Data Reviewed: I have personally reviewed following labs and imaging studies  CBC: Recent Labs  Lab 11/16/21 1341  WBC 10.4  NEUTROABS 7.5  HGB 15.8  HCT 44.7  MCV 87.6  PLT 270   Basic Metabolic Panel: Recent Labs  Lab 11/16/21 1341 11/17/21 0338 11/18/21 0605  NA 134* 135 136  K 4.1 3.7 3.7  CL 97* 107 107  CO2 27 26 26   GLUCOSE 115* 148* 106*  BUN 19 13 8   CREATININE 1.10 0.81 0.74  CALCIUM 9.3 7.6* 7.9*   GFR: Estimated Creatinine Clearance: 138.9 mL/min (by C-G formula based on SCr of 0.74 mg/dL).  Cardiac Enzymes: Recent Labs  Lab 11/16/21 1341 11/17/21 0338 11/18/21 0605  CKTOTAL >50,000* 29,650* 14,550*   BNP (last 3 results) No results for input(s): "PROBNP" in the last 8760 hours. HbA1C: Recent Labs    11/16/21 1341  HGBA1C 5.9*   Recent Results (from the past 240 hour(s))  SARS Coronavirus 2 by RT PCR (hospital order, performed in [facility] hospital lab) *cepheid single result test* Anterior Nasal Swab     Status: None   Collection Time: 11/16/21 11:57 AM   Specimen: Anterior Nasal Swab  Result Value Ref Range Status   SARS Coronavirus 2 by RT PCR NEGATIVE NEGATIVE Final    Comment: (NOTE) SARS-CoV-2 target nucleic acids are NOT DETECTED.  The SARS-CoV-2 RNA is generally detectable in upper and lower respiratory specimens during the acute phase of  infection. The lowest concentration of SARS-CoV-2 viral copies this assay can detect is 250 copies / mL. A negative result does not preclude SARS-CoV-2 infection and should not be used as the sole basis for treatment or other patient management decisions.  A negative result may occur with improper specimen collection / handling, submission of specimen other than nasopharyngeal swab, presence of viral mutation(s) within the areas targeted by this assay, and inadequate number of viral copies (<250 copies / mL). A negative result must be combined with clinical observations, patient history, and epidemiological information.  Fact Sheet for Patients:   11/18/21  Fact Sheet for Healthcare Providers: [facility]  This test is not yet approved or  cleared by the 11/18/21 FDA  and has been authorized for detection and/or diagnosis of SARS-CoV-2 by FDA under an Emergency Use Authorization (EUA).  This EUA will remain in effect (meaning this test can be used) for the duration of the COVID-19 declaration under Section 564(b)(1) of the Act, 21 U.S.C. section 360bbb-3(b)(1), unless the authorization is terminated or revoked sooner.  Performed at [facility], [address] [place], [place] [address]      Radiology Studies: No results found.   Scheduled Meds:  enoxaparin (LOVENOX) injection  40 mg Subcutaneous Q24H   gabapentin  300 mg Oral TID   hydrOXYzine  25 mg Oral TID   insulin aspart  0-5 Units Subcutaneous QHS   insulin aspart  0-9 Units Subcutaneous TID WC   methocarbamol  750 mg Oral QID   nicotine  14 mg Transdermal Daily   traZODone  100 mg Oral QHS   Continuous Infusions:  sodium bicarbonate 75 mEq in sodium chloride 0.45 % 1,075 mL infusion 125 mL/hr at 11/18/21 1733    LOS: 2 days   Shon Hale M.D on 11/18/2021 at 6:59 PM  Go to [email or web address] - for contact info  [facility] - Office   [phone]  If 7PM-7AM, please contact night-coverage [email or web address] 11/18/2021, 6:59 PM

## 2021-11-19 LAB — BASIC METABOLIC PANEL
Anion gap: 4 — ABNORMAL LOW (ref 5–15)
BUN: 7 mg/dL (ref 6–20)
CO2: 27 mmol/L (ref 22–32)
Calcium: 8 mg/dL — ABNORMAL LOW (ref 8.9–10.3)
Chloride: 106 mmol/L (ref 98–111)
Creatinine, Ser: 0.88 mg/dL (ref 0.61–1.24)
GFR, Estimated: >60 mL/min (ref >60)
Glucose, Bld: 97 mg/dL (ref 70–99)
Potassium: 3.9 mmol/L (ref 3.5–5.1)
Sodium: 137 mmol/L (ref 135–145)

## 2021-11-19 LAB — GLUCOSE, CAPILLARY: Glucose-Capillary: 99 mg/dL (ref 70–99)

## 2021-11-19 LAB — CK: Total CK: 12610 U/L — ABNORMAL HIGH (ref 49–397)

## 2021-11-19 NOTE — Discharge Summary (Signed)
Willie Herman, is a 46 y.o. male  DOB [birth year]  MRN [id number].  Admission date:  11/16/2021  Admitting Physician  Onnie Boer, MD  Discharge Date:  11/19/2021   Primary MD  Pcp, No  Recommendations for primary care physician for things to follow:  - Patient decided to leave AGAINST MEDICAL ADVICE despite persistently elevated CK levels  Admission Diagnosis  Rhabdomyolysis [M62.82] Non-traumatic rhabdomyolysis [M62.82]   Discharge Diagnosis  Rhabdomyolysis [M62.82] Non-traumatic rhabdomyolysis [M62.82]    Principal Problem:   Rhabdomyolysis Active Problems:   Diabetes mellitus (HCC)   MDD (major depressive disorder)   Tobacco abuse     Past Medical History:  Diagnosis Date   Anxiety    COPD (chronic obstructive pulmonary disease) (HCC)    Depression    diet controlled diabetes    former   GERD (gastroesophageal reflux disease)    High cholesterol    Hypertension    Suicidal ideation     Past Surgical History:  Procedure Laterality Date   ABDOMINAL SURGERY     APPENDECTOMY     LEG SURGERY     when 12. Patient reports he was in tractor accident and leg had to be repaired   MENISCUS REPAIR     VASCULAR SURGERY      HPI  from the history and physical done on the day of admission:   Chief Complaint: Body aches   HPI: [name] is a 46 y.o. male with medical history significant for suicidal ideation, diabetes mellitus.  Patient presented to the ED with complaints of generalized body aches and fatigue of 2 days duration.  He also reports bilateral lower extremity pain.  Reports he is unable to walk because of the pain.  Normally he smokes meth amphetamine and discusses episodes of rhabdomyolysis.  But this time for the first time he injected methamphetamine into his right forearm.  Reports he has never injected before.  Was previously homeless now lives with a friend. Reports  brownish discoloration to his urine.  No fevers no chills.  No cough no difficulty breathing.  No chest pains.  Denies use of any other illicit drugs.  Not taking any of his medication in about 8 to 9 months.  Denies alcohol intake.  Smokes a pack to a pack and a half of cigarettes daily.   ED Course: Stable vitals.  CK greater than 50,000.  Creatinine stable at 1.1.  UA not suggestive of infection, shows moderate hemoglobin.  Chest x-ray clear. 2 L bolus given.  Hospitalist to admit for rhabdomyolysis.   Review of Systems: As per HPI all other systems reviewed and negative.   Hospital Course:   Brief Narrative:  46 y.o. male with medical history significant for suicidal ideation, diabetes mellitus and polysubstance abuse including methamphetamine admitted on 11/16/2021 with severely elevated CKs in the setting of methamphetamine use -Patient decided to leave AGAINST MEDICAL ADVICE despite persistently elevated CK levels  Assessment and Plan: 1)Rhabdomyolysis Patient has repeated episodes (at least 5 times)  in the past of elevated CKs in the setting of methamphetamine use this is not unusual for him  -Patient use methamphetamine IV on 11/14/2021  -CK on admission where severely elevated at > 50,000. -With aggressive hydration and bicarb drips CK currently down to 14,550 (50,000 >>29,650 >>14,550>>12,610) -Bicarb is currently 27 -Treated with bicarb drip and aggressive IV fluid hydration -Patient decided to leave AGAINST MEDICAL ADVICE despite persistently elevated CK levels   Diabetes mellitus (HCC) .-A1c is 5.9 reflecting excellent diabetic control PTA -Was supposed to be on metformin, prior to admission was noncompliant, will not start this now - Substance induced mood disorder /methamphetamine abuse/history of MDD -Treated with  trazodone nightly, Hydroxyzine 25 mg 3 times daily, Gabapentin 300 mg 3 times daily and methocarbamol 750 mg 4 times daily to blunt withdrawal effects -PTA  patient was supposed to be on Abilify and Lexapro -prior to admission patient was noncompliant with either  -Patient decided to leave AGAINST MEDICAL ADVICE despite persistently elevated CK levels   Tobacco abuse -Nicotine patch as advised -Patient is not ready to quit smoking   Class II obesity- -Low calorie diet, portion control and increase physical activity discussed with patient -Body mass index is 30.66 kg/m.  Discharge Condition: Patient decided to leave AGAINST MEDICAL ADVICE despite persistently elevated CK levels  Diet and Activity recommendation:  As advised  Discharge Instructions   Patient decided to leave AGAINST MEDICAL ADVICE despite persistently elevated CK levels   Discharge Medications   Major procedures and Radiology Reports - PLEASE review detailed and final reports for all details, in brief -   DG Chest Portable 1 View  Result Date: 11/16/2021 CLINICAL DATA:  Cough. EXAM: PORTABLE CHEST 1 VIEW COMPARISON:  November 26, 2020. FINDINGS: The heart size and mediastinal contours are within normal limits. Both lungs are clear. The visualized skeletal structures are unremarkable. IMPRESSION: No active disease. Electronically Signed   By: Lupita Raider M.D.   On: 11/16/2021 12:29    Micro Results   Recent Results (from the past 240 hour(s))  SARS Coronavirus 2 by RT PCR (hospital order, performed in [facility] hospital lab) *cepheid single result test* Anterior Nasal Swab     Status: None   Collection Time: 11/16/21 11:57 AM   Specimen: Anterior Nasal Swab  Result Value Ref Range Status   SARS Coronavirus 2 by RT PCR NEGATIVE NEGATIVE Final    Comment: (NOTE) SARS-CoV-2 target nucleic acids are NOT DETECTED.  The SARS-CoV-2 RNA is generally detectable in upper and lower respiratory specimens during the acute phase of infection. The lowest concentration of SARS-CoV-2 viral copies this assay can detect is 250 copies / mL. A negative result does not preclude  SARS-CoV-2 infection and should not be used as the sole basis for treatment or other patient management decisions.  A negative result may occur with improper specimen collection / handling, submission of specimen other than nasopharyngeal swab, presence of viral mutation(s) within the areas targeted by this assay, and inadequate number of viral copies (<250 copies / mL). A negative result must be combined with clinical observations, patient history, and epidemiological information.  Fact Sheet for Patients:   RoadLapTop.co.za  Fact Sheet for Healthcare Providers: http://kim-miller.com/  This test is not yet approved or  cleared by the [place] FDA and has been authorized for detection and/or diagnosis of SARS-CoV-2 by FDA under an Emergency Use Authorization (EUA).  This EUA will remain in effect (meaning this test can be used) for the  duration of the COVID-19 declaration under Section 564(b)(1) of the Act, 21 U.S.C. section 360bbb-3(b)(1), unless the authorization is terminated or revoked sooner.  Performed at [facility], [address], [place], [place] [address]    Today   Subjective    Jeorge Reister today has decided to leave AGAINST MEDICAL ADVICE despite persistently elevated CK levels - Complains of fatigue and myalgias    No fever  Or chills   No Nausea, Vomiting or Diarrhea   Patient has been seen and examined prior to discharge   Objective   Blood pressure 126/82, pulse (!) 57, temperature 97.8 F (36.6 C), temperature source Oral, resp. rate 16, height 5\' 11"  (1.803 m), weight 99.7 kg, SpO2 97 %.   Intake/Output Summary (Last 24 hours) at 11/19/2021 1205 Last data filed at 11/18/2021 1700 Gross per 24 hour  Intake 240 ml  Output --  Net 240 ml   Exam Gen:- Awake Alert, no acute distress  HEENT:- [place].AT, No sclera icterus Neck-Supple Neck,No JVD,.  Lungs-  CTAB , good air movement bilaterally CV- S1, S2  normal, regular Abd-  +ve B.Sounds, Abd Soft, No tenderness,    Extremity/Skin:- No  edema,   good pulses Psych-affect is appropriate, oriented x3 Neuro-generalized weakness, no new focal deficits, no tremors    Data Review   CBC w Diff:  Lab Results  Component Value Date   WBC 10.4 11/16/2021   HGB 15.8 11/16/2021   HCT 44.7 11/16/2021   PLT 270 11/16/2021   LYMPHOPCT 19 11/16/2021   MONOPCT 6 11/16/2021   EOSPCT 2 11/16/2021   BASOPCT 0 11/16/2021   CMP:  Lab Results  Component Value Date   NA 137 11/19/2021   K 3.9 11/19/2021   CL 106 11/19/2021   CO2 27 11/19/2021   BUN 7 11/19/2021   CREATININE 0.88 11/19/2021   PROT 5.7 (L) 11/26/2020   ALBUMIN 3.9 11/26/2020   BILITOT 1.7 (H) 11/26/2020   ALKPHOS 38 11/26/2020   AST 83 (H) 11/26/2020   ALT 24 11/26/2020   Total Discharge time is about 33 minutes  01/26/2021 M.D on 11/19/2021 at 12:05 PM  Go to [email or web address] -  for contact info  [facility] - Office  [phone]

## 2021-11-19 NOTE — Progress Notes (Signed)
Patient request to leave AMA, D.r Courage is aware, given AMA papers, signed by patient and witnessed by this Clinical research associate. IV removed from RFA  without difficulty , patient ambulatory off of unit

## 2021-11-22 ENCOUNTER — Other Ambulatory Visit: Payer: Self-pay

## 2021-11-22 ENCOUNTER — Encounter (HOSPITAL_COMMUNITY): Payer: Self-pay

## 2021-11-22 ENCOUNTER — Emergency Department (HOSPITAL_COMMUNITY)
Admission: EM | Admit: 2021-11-22 | Discharge: 2021-11-23 | Disposition: A | Discharge status: Home / Self Care | Payer: 59 | Attending: Emergency Medicine | Admitting: Emergency Medicine

## 2021-11-22 DIAGNOSIS — R110 Nausea: Secondary | ICD-10-CM | POA: Insufficient documentation

## 2021-11-22 DIAGNOSIS — Z5321 Procedure and treatment not carried out due to patient leaving prior to being seen by health care provider: Secondary | ICD-10-CM | POA: Diagnosis not present

## 2021-11-22 DIAGNOSIS — R531 Weakness: Secondary | ICD-10-CM | POA: Diagnosis not present

## 2021-11-22 DIAGNOSIS — M791 Myalgia, unspecified site: Secondary | ICD-10-CM | POA: Insufficient documentation

## 2021-11-22 LAB — CBC WITH DIFFERENTIAL/PLATELET
Abs Immature Granulocytes: 0.06 10*3/uL (ref 0.00–0.07)
Basophils Absolute: 0 10*3/uL (ref 0.0–0.1)
Basophils Relative: 0 %
Eosinophils Absolute: 0.4 10*3/uL (ref 0.0–0.5)
Eosinophils Relative: 5 %
HCT: 40.9 % (ref 39.0–52.0)
Hemoglobin: 14.6 g/dL (ref 13.0–17.0)
Immature Granulocytes: 1 %
Lymphocytes Relative: 18 %
Lymphs Abs: 1.6 10*3/uL (ref 0.7–4.0)
MCH: 30.7 pg (ref 26.0–34.0)
MCHC: 35.7 g/dL (ref 30.0–36.0)
MCV: 86.1 fL (ref 80.0–100.0)
Monocytes Absolute: 0.8 10*3/uL (ref 0.1–1.0)
Monocytes Relative: 8 %
Neutro Abs: 6.3 10*3/uL (ref 1.7–7.7)
Neutrophils Relative %: 68 %
Platelets: 271 10*3/uL (ref 150–400)
RBC: 4.75 MIL/uL (ref 4.22–5.81)
RDW: 11.9 % (ref 11.5–15.5)
WBC: 9.2 10*3/uL (ref 4.0–10.5)
nRBC: 0 % (ref 0.0–0.2)

## 2021-11-22 MED ORDER — LACTATED RINGERS IV BOLUS
1000.0000 mL | Freq: Once | INTRAVENOUS | Status: AC
  Administered 2021-11-22: 1000 mL via INTRAVENOUS

## 2021-11-22 NOTE — ED Triage Notes (Signed)
Pov from home. Cc of body aches and fatigue. Says he left AMA from being admitted and regrets it. Wants to be checked back in for it.  8/10 pain

## 2021-11-23 LAB — URINALYSIS, ROUTINE W REFLEX MICROSCOPIC
Bilirubin Urine: NEGATIVE
Glucose, UA: NEGATIVE mg/dL
Hgb urine dipstick: NEGATIVE
Ketones, ur: NEGATIVE mg/dL
Leukocytes,Ua: NEGATIVE
Nitrite: NEGATIVE
Protein, ur: NEGATIVE mg/dL
Specific Gravity, Urine: 1.027 (ref 1.005–1.030)
pH: 5 (ref 5.0–8.0)

## 2021-11-23 LAB — COMPREHENSIVE METABOLIC PANEL
ALT: 116 U/L — ABNORMAL HIGH (ref 0–44)
AST: 75 U/L — ABNORMAL HIGH (ref 15–41)
Albumin: 3.9 g/dL (ref 3.5–5.0)
Alkaline Phosphatase: 54 U/L (ref 38–126)
Anion gap: 9 (ref 5–15)
BUN: 19 mg/dL (ref 6–20)
CO2: 23 mmol/L (ref 22–32)
Calcium: 9.1 mg/dL (ref 8.9–10.3)
Chloride: 104 mmol/L (ref 98–111)
Creatinine, Ser: 0.99 mg/dL (ref 0.61–1.24)
GFR, Estimated: >60 mL/min (ref >60)
Glucose, Bld: 163 mg/dL — ABNORMAL HIGH (ref 70–99)
Potassium: 3.8 mmol/L (ref 3.5–5.1)
Sodium: 136 mmol/L (ref 135–145)
Total Bilirubin: 0.6 mg/dL (ref 0.3–1.2)
Total Protein: 6.6 g/dL (ref 6.5–8.1)

## 2021-11-23 LAB — CK: Total CK: 1342 U/L — ABNORMAL HIGH (ref 49–397)

## 2021-11-23 NOTE — ED Provider Notes (Signed)
[facility] EMERGENCY DEPARTMENT Provider Note   CSN: [id number] Arrival date & time: 11/22/21  2227     History  No chief complaint on file.   Willie Herman is a 46 y.o. male.  46 year old male who presents the ER today with body aches.  Patient states he was recently admitted to the hospital for couple days because of rhabdomyolysis.  Patient states he had that multiple times in the past always associated with methamphetamine use as it was this last time as well.  According to him and looking at the records he came in with a CK greater than 50,000 which down trended to around 12,000 on the day he left AMA.  He received a bunch of fluids and pain medicine as well.  His kidney function was stable.  Patient states that after he left he did use meth some more and now has body aches again.  States he thinks he left the hospital too early.  States his urine is brown.  States he has severe allover pain.  No new trauma.  No other new symptoms.  States he does feel a bit nauseous and weak as well.        Home Medications Prior to Admission medications   Medication Sig Start Date End Date Taking? Authorizing Provider  ARIPiprazole (ABILIFY) 15 MG tablet Take by mouth. Patient not taking: Reported on 11/26/2020 10/16/20   [provider]  escitalopram (LEXAPRO) 10 MG tablet Take 10 mg by mouth daily. Patient not taking: Reported on 11/17/2021 06/24/20   [provider]  gabapentin (NEURONTIN) 300 MG capsule Take 600 mg by mouth 3 (three) times daily. Patient not taking: Reported on 11/17/2021    [provider]  ibuprofen (ADVIL) 200 MG tablet Take by mouth. Patient not taking: Reported on 11/26/2020    [provider]  ibuprofen (ADVIL) 800 MG tablet Take 1 tablet (800 mg total) by mouth every 8 (eight) hours as needed for moderate pain. Patient not taking: Reported on 11/17/2021 11/26/20   Ezequiel Essex, MD  metFORMIN (GLUCOPHAGE) 500 MG tablet Take 1 tablet (500  mg total) by mouth daily with breakfast. Patient not taking: Reported on 11/26/2020 05/15/20   Ethelene Hal, NP  metFORMIN (GLUCOPHAGE) 500 MG tablet Take by mouth. Patient not taking: Reported on 11/26/2020 05/15/20   [provider]  nicotine (NICODERM CQ - DOSED IN MG/24 HOURS) 14 mg/24hr patch Place onto the skin. Patient not taking: Reported on 11/26/2020    [provider]  pantoprazole (PROTONIX) 20 MG tablet Take 1 tablet by mouth daily. Patient not taking: Reported on 11/26/2020    [provider]  traZODone (DESYREL) 50 MG tablet Take 50 mg by mouth at bedtime. Patient not taking: Reported on 11/17/2021 10/16/20   [provider]  blood glucose meter kit and supplies KIT Dispense based on patient and insurance preference. Use up to four times daily as directed. (FOR ICD-9 250.00, 250.01). 05/14/20 09/21/20  Ethelene Hal, NP      Allergies    Pineapple    Review of Systems   Review of Systems  Physical Exam Updated Vital Signs BP (!) 149/84 (BP Location: Right Arm)   Pulse 93   Temp 98.5 F (36.9 C) (Oral)   Resp 17   Ht '5\' 11"'  (1.803 m)   Wt 100 kg   SpO2 97%   BMI 30.75 kg/m  Physical Exam Vitals and nursing note reviewed.  Constitutional:  Appearance: He is well-developed.  HENT:     Head: Normocephalic and atraumatic.     Nose: No congestion or rhinorrhea.     Mouth/Throat:     Mouth: Mucous membranes are moist.     Pharynx: Oropharynx is clear.  Eyes:     Pupils: Pupils are equal, round, and reactive to light.  Cardiovascular:     Rate and Rhythm: Normal rate.  Pulmonary:     Effort: Pulmonary effort is normal. No respiratory distress.  Abdominal:     General: Abdomen is flat. There is no distension.  Musculoskeletal:        General: Normal range of motion.     Cervical back: Normal range of motion.  Skin:    General: Skin is warm and dry.  Neurological:     General: No focal deficit present.     Mental  Status: He is alert.     ED Results / Procedures / Treatments   Labs (all labs ordered are listed, but only abnormal results are displayed) Labs Reviewed  COMPREHENSIVE METABOLIC PANEL - Abnormal; Notable for the following components:      Result Value   Glucose, Bld 163 (*)    AST 75 (*)    ALT 116 (*)    All other components within normal limits  CK - Abnormal; Notable for the following components:   Total CK 1,342 (*)    All other components within normal limits  CBC WITH DIFFERENTIAL/PLATELET  URINALYSIS, ROUTINE W REFLEX MICROSCOPIC    EKG None  Radiology No results found.  Procedures Procedures    Medications Ordered in ED Medications  lactated ringers bolus 1,000 mL (0 mLs Intravenous Stopped 11/23/21 0057)    ED Course/ Medical Decision Making/ A&P                           Medical Decision Making Amount and/or Complexity of Data Reviewed Labs: ordered.   Liter of fluids were ordered, CBC, CMP, urinalysis and CK.  Kidney function to return to normal however patient apparently left as he was upset he did not receive any pain medication.  His CK came back after he left and was significantly improved over a couple days ago.  Do not see any reason that he would need to be admitted to the hospital receiving further treatment in the ER.  However patient did elope and did not receive any type of follow-up instructions.   Final Clinical Impression(s) / ED Diagnoses Final diagnoses:  None    Rx / DC Orders ED Discharge Orders     None         Inigo Lantigua, Corene Cornea, MD 11/23/21 [phone]

## 2021-11-23 NOTE — ED Notes (Signed)
Pt walked out yelling at staff, saying he was leaving since we had done nothing for his pain. Pt states he has removed his own IV.

## 2021-11-23 NOTE — ED Notes (Signed)
Patient removed Iv. Patient stated he was not waiting around anymore. Patient walked out of the ED.

## 2021-12-03 ENCOUNTER — Encounter (HOSPITAL_COMMUNITY): Payer: Self-pay

## 2021-12-03 ENCOUNTER — Emergency Department (HOSPITAL_COMMUNITY)
Admission: EM | Admit: 2021-12-03 | Discharge: 2021-12-03 | Disposition: A | Discharge status: Home / Self Care | Payer: 59 | Attending: Emergency Medicine | Admitting: Emergency Medicine

## 2021-12-03 ENCOUNTER — Other Ambulatory Visit: Payer: Self-pay

## 2021-12-03 DIAGNOSIS — F17200 Nicotine dependence, unspecified, uncomplicated: Secondary | ICD-10-CM | POA: Insufficient documentation

## 2021-12-03 DIAGNOSIS — D72829 Elevated white blood cell count, unspecified: Secondary | ICD-10-CM | POA: Insufficient documentation

## 2021-12-03 DIAGNOSIS — I10 Essential (primary) hypertension: Secondary | ICD-10-CM | POA: Diagnosis not present

## 2021-12-03 DIAGNOSIS — M791 Myalgia, unspecified site: Secondary | ICD-10-CM | POA: Diagnosis not present

## 2021-12-03 DIAGNOSIS — J449 Chronic obstructive pulmonary disease, unspecified: Secondary | ICD-10-CM | POA: Insufficient documentation

## 2021-12-03 DIAGNOSIS — Z5900 Homelessness unspecified: Secondary | ICD-10-CM | POA: Diagnosis not present

## 2021-12-03 DIAGNOSIS — F1510 Other stimulant abuse, uncomplicated: Secondary | ICD-10-CM

## 2021-12-03 DIAGNOSIS — Z20822 Contact with and (suspected) exposure to covid-19: Secondary | ICD-10-CM | POA: Diagnosis not present

## 2021-12-03 DIAGNOSIS — R748 Abnormal levels of other serum enzymes: Secondary | ICD-10-CM | POA: Diagnosis not present

## 2021-12-03 DIAGNOSIS — Z7984 Long term (current) use of oral hypoglycemic drugs: Secondary | ICD-10-CM | POA: Diagnosis not present

## 2021-12-03 DIAGNOSIS — R7401 Elevation of levels of liver transaminase levels: Secondary | ICD-10-CM | POA: Diagnosis not present

## 2021-12-03 DIAGNOSIS — Z79899 Other long term (current) drug therapy: Secondary | ICD-10-CM | POA: Insufficient documentation

## 2021-12-03 DIAGNOSIS — R69 Illness, unspecified: Secondary | ICD-10-CM | POA: Diagnosis not present

## 2021-12-03 LAB — URINALYSIS, ROUTINE W REFLEX MICROSCOPIC
Bacteria, UA: NONE SEEN
Bilirubin Urine: NEGATIVE
Glucose, UA: NEGATIVE mg/dL
Hgb urine dipstick: NEGATIVE
Ketones, ur: 5 mg/dL — AB
Leukocytes,Ua: NEGATIVE
Nitrite: NEGATIVE
Protein, ur: 30 mg/dL — AB
Specific Gravity, Urine: 1.028 (ref 1.005–1.030)
pH: 5 (ref 5.0–8.0)

## 2021-12-03 LAB — COMPREHENSIVE METABOLIC PANEL
ALT: 37 U/L (ref 0–44)
AST: 46 U/L — ABNORMAL HIGH (ref 15–41)
Albumin: 4.2 g/dL (ref 3.5–5.0)
Alkaline Phosphatase: 56 U/L (ref 38–126)
Anion gap: 8 (ref 5–15)
BUN: 17 mg/dL (ref 6–20)
CO2: 25 mmol/L (ref 22–32)
Calcium: 9.1 mg/dL (ref 8.9–10.3)
Chloride: 103 mmol/L (ref 98–111)
Creatinine, Ser: 0.99 mg/dL (ref 0.61–1.24)
GFR, Estimated: >60 mL/min (ref >60)
Glucose, Bld: 76 mg/dL (ref 70–99)
Potassium: 3.7 mmol/L (ref 3.5–5.1)
Sodium: 136 mmol/L (ref 135–145)
Total Bilirubin: 0.8 mg/dL (ref 0.3–1.2)
Total Protein: 6.9 g/dL (ref 6.5–8.1)

## 2021-12-03 LAB — RESP PANEL BY RT-PCR (FLU A&B, COVID) ARPGX2
Influenza A by PCR: NEGATIVE
Influenza B by PCR: NEGATIVE
SARS Coronavirus 2 by RT PCR: NEGATIVE

## 2021-12-03 LAB — ETHANOL: Alcohol, Ethyl (B): <10 mg/dL (ref <10)

## 2021-12-03 LAB — CBC
HCT: 42.9 % (ref 39.0–52.0)
Hemoglobin: 15.2 g/dL (ref 13.0–17.0)
MCH: 30.8 pg (ref 26.0–34.0)
MCHC: 35.4 g/dL (ref 30.0–36.0)
MCV: 86.8 fL (ref 80.0–100.0)
Platelets: 326 10*3/uL (ref 150–400)
RBC: 4.94 MIL/uL (ref 4.22–5.81)
RDW: 12.1 % (ref 11.5–15.5)
WBC: 12.3 10*3/uL — ABNORMAL HIGH (ref 4.0–10.5)
nRBC: 0 % (ref 0.0–0.2)

## 2021-12-03 LAB — RAPID URINE DRUG SCREEN, HOSP PERFORMED
Amphetamines: POSITIVE — AB
Barbiturates: NOT DETECTED
Benzodiazepines: NOT DETECTED
Cocaine: NOT DETECTED
Opiates: NOT DETECTED
Tetrahydrocannabinol: NOT DETECTED

## 2021-12-03 LAB — CK: Total CK: 2117 U/L — ABNORMAL HIGH (ref 49–397)

## 2021-12-03 MED ORDER — MORPHINE SULFATE (PF) 2 MG/ML IV SOLN
2.0000 mg | Freq: Once | INTRAVENOUS | Status: AC
  Administered 2021-12-03: 2 mg via INTRAVENOUS
  Filled 2021-12-03: qty 1

## 2021-12-03 MED ORDER — SODIUM CHLORIDE 0.9 % IV BOLUS
1000.0000 mL | Freq: Once | INTRAVENOUS | Status: AC
Start: 2021-12-03 — End: 2021-12-03
  Administered 2021-12-03: 1000 mL via INTRAVENOUS

## 2021-12-03 MED ORDER — SODIUM CHLORIDE 0.9 % IV BOLUS
1000.0000 mL | Freq: Once | INTRAVENOUS | Status: AC
  Administered 2021-12-03: 1000 mL via INTRAVENOUS

## 2021-12-03 NOTE — ED Triage Notes (Signed)
Pt presents to ED with complaints of bilateral leg pain, dark urine and states feels weak. Pt states he thinks he has Rhabdo again.

## 2021-12-03 NOTE — ED Provider Notes (Signed)
[facility] EMERGENCY DEPARTMENT Provider Note   CSN: [id number] Arrival date & time: 12/03/21  1350     History  Chief Complaint  Patient presents with   Generalized Body Aches    Willie Herman is a 46 y.o. male with a past medical history significant for hypertension, acid reflux, COPD, depression, anxiety, hyperlipidemia, substance-induced mood disorder, methamphetamine abuse, homelessness who has been recently seen and evaluated for rhabdomyolysis, last admitted on 11/16/2021, eloped on 8/27, seen and evaluated again on 8/31 in the emergency department and eloped.  On initial assessment on 8/24 patient with CK of 50,000, had decreased to CK of 12,610 prior to leaving AMA.  Per the admission notes patient with at least 5 episodes of elevated CK, rhabdo.  Patient last reports meth use yesterday. No IV use since last episode 8/22. Reports normal route of ingestion is smoking.  He denies any other drug use at this time.  HPI     Home Medications Prior to Admission medications   Medication Sig Start Date End Date Taking? Authorizing Provider  ARIPiprazole (ABILIFY) 15 MG tablet Take by mouth. Patient not taking: Reported on 11/26/2020 10/16/20   [provider]  escitalopram (LEXAPRO) 10 MG tablet Take 10 mg by mouth daily. Patient not taking: Reported on 11/17/2021 06/24/20   [provider]  gabapentin (NEURONTIN) 300 MG capsule Take 600 mg by mouth 3 (three) times daily. Patient not taking: Reported on 11/17/2021    [provider]  ibuprofen (ADVIL) 200 MG tablet Take by mouth. Patient not taking: Reported on 11/26/2020    [provider]  ibuprofen (ADVIL) 800 MG tablet Take 1 tablet (800 mg total) by mouth every 8 (eight) hours as needed for moderate pain. Patient not taking: Reported on 11/17/2021 11/26/20   Ezequiel Essex, MD  metFORMIN (GLUCOPHAGE) 500 MG tablet Take 1 tablet (500 mg total) by mouth daily with breakfast. Patient not taking: Reported  on 11/26/2020 05/15/20   Ethelene Hal, NP  metFORMIN (GLUCOPHAGE) 500 MG tablet Take by mouth. Patient not taking: Reported on 11/26/2020 05/15/20   [provider]  nicotine (NICODERM CQ - DOSED IN MG/24 HOURS) 14 mg/24hr patch Place onto the skin. Patient not taking: Reported on 11/26/2020    [provider]  pantoprazole (PROTONIX) 20 MG tablet Take 1 tablet by mouth daily. Patient not taking: Reported on 11/26/2020    [provider]  traZODone (DESYREL) 50 MG tablet Take 50 mg by mouth at bedtime. Patient not taking: Reported on 11/17/2021 10/16/20   [provider]  blood glucose meter kit and supplies KIT Dispense based on patient and insurance preference. Use up to four times daily as directed. (FOR ICD-9 250.00, 250.01). 05/14/20 09/21/20  Ethelene Hal, NP      Allergies    Pineapple    Review of Systems   Review of Systems  All other systems reviewed and are negative.   Physical Exam Updated Vital Signs BP 132/86   Pulse 61   Temp 98.2 F (36.8 C) (Oral)   Resp 15   Ht '5\' 11"'  (1.803 m)   Wt 94.8 kg   SpO2 98%   BMI 29.15 kg/m  Physical Exam Vitals and nursing note reviewed.  Constitutional:      General: He is not in acute distress.    Appearance: Normal appearance.     Comments: Chronically ill-appearing, disheveled  HENT:     Head: Normocephalic and atraumatic.     Mouth/Throat:  Mouth: Mucous membranes are dry.  Eyes:     General:        Right eye: No discharge.        Left eye: No discharge.  Cardiovascular:     Rate and Rhythm: Normal rate and regular rhythm.     Heart sounds: No murmur heard.    No friction rub. No gallop.  Pulmonary:     Effort: Pulmonary effort is normal.     Breath sounds: Normal breath sounds.  Abdominal:     General: Bowel sounds are normal.     Palpations: Abdomen is soft.  Skin:    General: Skin is warm and dry.     Capillary Refill: Capillary refill takes less than 2 seconds.      Comments: No evidence of significant cellulitis, or other skin breakdown from meth and IV drug use.  Patient has a red macular rash bilateral ankles which he reports is not itchy.  Neurological:     Mental Status: He is alert and oriented to person, place, and time.  Psychiatric:        Mood and Affect: Mood normal.        Behavior: Behavior normal.     ED Results / Procedures / Treatments   Labs (all labs ordered are listed, but only abnormal results are displayed) Labs Reviewed  CBC - Abnormal; Notable for the following components:      Result Value   WBC 12.3 (*)    All other components within normal limits  CK - Abnormal; Notable for the following components:   Total CK 2,117 (*)    All other components within normal limits  COMPREHENSIVE METABOLIC PANEL - Abnormal; Notable for the following components:   AST 46 (*)    All other components within normal limits  URINALYSIS, ROUTINE W REFLEX MICROSCOPIC - Abnormal; Notable for the following components:   Ketones, ur 5 (*)    Protein, ur 30 (*)    All other components within normal limits  RAPID URINE DRUG SCREEN, HOSP PERFORMED - Abnormal; Notable for the following components:   Amphetamines POSITIVE (*)    All other components within normal limits  RESP PANEL BY RT-PCR (FLU A&B, COVID) [id number]  ETHANOL    EKG None  Radiology No results found.  Procedures Procedures    Medications Ordered in ED Medications  sodium chloride 0.9 % bolus 1,000 mL (0 mLs Intravenous Stopped 12/03/21 1745)  morphine (PF) 2 MG/ML injection 2 mg (2 mg Intravenous Given 12/03/21 1523)  sodium chloride 0.9 % bolus 1,000 mL (1,000 mLs Intravenous New Bag/Given 12/03/21 1715)    ED Course/ Medical Decision Making/ A&P                           Medical Decision Making Amount and/or Complexity of Data Reviewed Labs: ordered.  Risk Prescription drug management.   This is a 46 year old male with chronic disheveled, chronically ill  appearance who is in no acute distress who presents with concern for leg cramping, dehydration, dark urine.  Patient is worried that he may be in acute rhabdomyolysis at this time.  Patient was recently hospitalized less than a month ago with CK greater than 50,000, he left AMA with CK levels still at 12,000.  He was seen and evaluated in the emergency department, and again left/eloped prior to treatment, but at that time had had decreased CK around 1300.  Patient reports that since then  he has had ongoing meth use, dark urine, concern for dehydration.  He denies any chest pain, shortness of breath, abdominal pain, nausea, vomiting, or other complaints today.  I independently interpreted lab work which showed CMP is overall unremarkable, mild elevation of AST at 46, creatinine is stable at 0.99.  Patient has not have any alcohol on board today.  CBC notable for mild leukocytosis at 12.3.  His CK is elevated at 2100, which is clinically very elevated but overall significantly less than at his hospitalization last month.  His RVP is negative for COVID, flu, UDS is positive for amphetamines.  I administered morphine for his leg cramping, and patient received 2 L fluids of normal saline.  Discussed with patient that although his CK is significantly elevated compared to normal individual think that this is likely representative of mild dehydration and CK bump from his chronic meth use.  As he does not have any acute kidney injury today do not think that he needs to be hospitalized or admitted at this time, discussed that I recommend plenty of oral rehydration, as well as for him to seek resources for drug abuse and counseling.  Patient understands and agrees with this plan, and is discharged in stable condition after completion of 2 L normal saline.  Patient reports pain and cramping in legs is improved after fluid bolus. Final Clinical Impression(s) / ED Diagnoses Final diagnoses:  Elevated CK  Methamphetamine  abuse [facility])    Rx / DC Orders ED Discharge Orders     None         Dorien Chihuahua 12/03/21 Minda Meo, MD 12/04/21 1301

## 2021-12-03 NOTE — Discharge Instructions (Signed)
Please refer to the outpatient counseling and substance abuse resources that I provided.  I recommend that you check yourself into a rehab facility in order to quit your addiction to methamphetamines, as you will continue to have health issues, and may develop kidney failure, or other complications from continued use.

## 2021-12-08 ENCOUNTER — Emergency Department (HOSPITAL_COMMUNITY)
Admission: EM | Admit: 2021-12-08 | Discharge: 2021-12-08 | Disposition: A | Discharge status: Home / Self Care | Payer: 59 | Attending: Emergency Medicine | Admitting: Emergency Medicine

## 2021-12-08 ENCOUNTER — Encounter (HOSPITAL_COMMUNITY): Payer: Self-pay | Admitting: Emergency Medicine

## 2021-12-08 DIAGNOSIS — R45851 Suicidal ideations: Secondary | ICD-10-CM | POA: Insufficient documentation

## 2021-12-08 DIAGNOSIS — F1914 Other psychoactive substance abuse with psychoactive substance-induced mood disorder: Secondary | ICD-10-CM | POA: Insufficient documentation

## 2021-12-08 DIAGNOSIS — Z5902 Unsheltered homelessness: Secondary | ICD-10-CM | POA: Insufficient documentation

## 2021-12-08 DIAGNOSIS — F1510 Other stimulant abuse, uncomplicated: Secondary | ICD-10-CM | POA: Insufficient documentation

## 2021-12-08 DIAGNOSIS — R69 Illness, unspecified: Secondary | ICD-10-CM | POA: Diagnosis not present

## 2021-12-08 DIAGNOSIS — E119 Type 2 diabetes mellitus without complications: Secondary | ICD-10-CM | POA: Insufficient documentation

## 2021-12-08 DIAGNOSIS — F332 Major depressive disorder, recurrent severe without psychotic features: Secondary | ICD-10-CM | POA: Insufficient documentation

## 2021-12-08 DIAGNOSIS — F329 Major depressive disorder, single episode, unspecified: Secondary | ICD-10-CM | POA: Diagnosis present

## 2021-12-08 DIAGNOSIS — F1994 Other psychoactive substance use, unspecified with psychoactive substance-induced mood disorder: Secondary | ICD-10-CM | POA: Diagnosis present

## 2021-12-08 DIAGNOSIS — Z20822 Contact with and (suspected) exposure to covid-19: Secondary | ICD-10-CM | POA: Insufficient documentation

## 2021-12-08 DIAGNOSIS — I10 Essential (primary) hypertension: Secondary | ICD-10-CM | POA: Insufficient documentation

## 2021-12-08 LAB — CBC
HCT: 43 % (ref 39.0–52.0)
Hemoglobin: 15.4 g/dL (ref 13.0–17.0)
MCH: 31.1 pg (ref 26.0–34.0)
MCHC: 35.8 g/dL (ref 30.0–36.0)
MCV: 86.9 fL (ref 80.0–100.0)
Platelets: 283 10*3/uL (ref 150–400)
RBC: 4.95 MIL/uL (ref 4.22–5.81)
RDW: 12.1 % (ref 11.5–15.5)
WBC: 10.2 10*3/uL (ref 4.0–10.5)
nRBC: 0 % (ref 0.0–0.2)

## 2021-12-08 LAB — COMPREHENSIVE METABOLIC PANEL
ALT: 49 U/L — ABNORMAL HIGH (ref 0–44)
AST: 77 U/L — ABNORMAL HIGH (ref 15–41)
Albumin: 4.1 g/dL (ref 3.5–5.0)
Alkaline Phosphatase: 51 U/L (ref 38–126)
Anion gap: 9 (ref 5–15)
BUN: 12 mg/dL (ref 6–20)
CO2: 23 mmol/L (ref 22–32)
Calcium: 8.9 mg/dL (ref 8.9–10.3)
Chloride: 104 mmol/L (ref 98–111)
Creatinine, Ser: 0.92 mg/dL (ref 0.61–1.24)
GFR, Estimated: >60 mL/min (ref >60)
Glucose, Bld: 90 mg/dL (ref 70–99)
Potassium: 3.6 mmol/L (ref 3.5–5.1)
Sodium: 136 mmol/L (ref 135–145)
Total Bilirubin: 1.3 mg/dL — ABNORMAL HIGH (ref 0.3–1.2)
Total Protein: 6.8 g/dL (ref 6.5–8.1)

## 2021-12-08 LAB — RESP PANEL BY RT-PCR (FLU A&B, COVID) ARPGX2
Influenza A by PCR: NEGATIVE
Influenza B by PCR: NEGATIVE
SARS Coronavirus 2 by RT PCR: NEGATIVE

## 2021-12-08 LAB — CBG MONITORING, ED
Glucose-Capillary: 110 mg/dL — ABNORMAL HIGH (ref 70–99)
Glucose-Capillary: 120 mg/dL — ABNORMAL HIGH (ref 70–99)

## 2021-12-08 LAB — SALICYLATE LEVEL: Salicylate Lvl: <7 mg/dL — ABNORMAL LOW (ref 7.0–30.0)

## 2021-12-08 LAB — ACETAMINOPHEN LEVEL: Acetaminophen (Tylenol), Serum: <10 ug/mL — ABNORMAL LOW (ref 10–30)

## 2021-12-08 LAB — RAPID URINE DRUG SCREEN, HOSP PERFORMED
Amphetamines: POSITIVE — AB
Barbiturates: NOT DETECTED
Benzodiazepines: NOT DETECTED
Cocaine: NOT DETECTED
Opiates: NOT DETECTED
Tetrahydrocannabinol: NOT DETECTED

## 2021-12-08 LAB — ETHANOL: Alcohol, Ethyl (B): <10 mg/dL (ref <10)

## 2021-12-08 MED ORDER — NICOTINE 21 MG/24HR TD PT24: 21.0000 mg | MEDICATED_PATCH | Freq: Every day | TRANSDERMAL | Status: DC

## 2021-12-08 MED ORDER — ACETAMINOPHEN 325 MG PO TABS
650.0000 mg | ORAL_TABLET | ORAL | Status: DC | PRN
  Administered 2021-12-08: 650 mg via ORAL
  Filled 2021-12-08: qty 2

## 2021-12-08 MED ORDER — ALUM & MAG HYDROXIDE-SIMETH 200-200-20 MG/5ML PO SUSP: 30.0000 mL | Freq: Four times a day (QID) | ORAL | Status: DC | PRN

## 2021-12-08 MED ORDER — ONDANSETRON HCL 4 MG PO TABS: 4.0000 mg | ORAL_TABLET | Freq: Three times a day (TID) | ORAL | Status: DC | PRN

## 2021-12-08 NOTE — ED Triage Notes (Signed)
Pt c/o suicidal thoughts since about noon today. Pt states he was prescribed medications for this in the past but has been off of them for about 9 months. Pt is currently homeless, denies any illegal drug used today.

## 2021-12-08 NOTE — ED Notes (Signed)
Pt alert, NAD, calm, c/o thirst and aching, denies nausea or indigestion/ reflux.
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
## 2021-12-08 NOTE — ED Provider Notes (Signed)
On my assessment, patient is not suicidal.  Psychiatry has cleared patient.  Seems to be drug related which she endorses as well.  Will give resources and discharge with return precautions.   Pricilla Loveless, MD 12/08/21 [phone]

## 2021-12-08 NOTE — ED Notes (Signed)
TTS assessment underway with safety sitter outside room

## 2021-12-08 NOTE — ED Notes (Signed)
Pt dressed out into wine scrubs, 2 backpacks and 1 patient belonging bag placed in lockers. Wanded by security
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
## 2021-12-08 NOTE — Consult Note (Signed)
Telepsych Consultation   Reason for Consult:  psych consult Referring Physician:  Dione Booze, MD Location of [facility]: [facility] Location of Provider: Behavioral Health TTS Department  Patient Identification: JAQUILLE KAU MRN:  [id number] Principal Diagnosis: <principal problem not specified> Diagnosis:  Active Problems:   MDD (major depressive disorder)   Stimulant abuse (HCC)   Suicidal ideation   Substance induced mood disorder (HCC)   Amphetamine (Stimulant) Abuse --   Unsheltered homelessness--[place] the Streets    Total Time spent with patient: 20 minutes  Subjective:   TREVER STREATER is a 46 y.o. male patient admitted with suicidal ideation.  Patient presents alert and oriented, calm and cooperative. Logical, goal oriented. "Had a little suicidal thoughts. I'm coming off drugs (meth)". States this is his first time coming off drugs. Reports ongoing use x4 years, last use of methamphetamine x3 days ago. Says increase in hospitalization from rhabdomyolysis prompted him to seek sobriety. Currently living with friend on [address] [place], [place]. Reports lack of sleep "for a few days and was having suicidal thoughts. Now I've been in here and gotten some sleep I Christoffer't have them anymore". He denies any active suicidal/homicidal ideations, auditory/visual hallucinations, safety concerns or social needs at this time. Reports plan to begin work in the next week and continue treatment on outpatient/as needed basis for mental and sobriety. Provider discussed [facility] and other local options for outpatient needs; information placed in AVS.   HPI:  ZAYLAN KISSOON is a 46 year old male patient with past psychiatric history of MDD, polysubstance abuse, suicidal ideation who presented to [facility] voluntarily for suicidal ideations Per chart review patient has presented to ED 11/16/21 (Non-Traumatic rhabdomyolysis), 11/22/21 (body aches) eloped, 12/03/21 (Elevated CK); 19 ED encounters in 2022.  UDS+amphetamines, BAL<10. PDMP reviewed, no prescriptions noted.   Past Psychiatric History: MDD, polysubstance abuse, suicidal ideation,   Risk to Self:   Risk to Others:   Prior Inpatient Therapy:   Prior Outpatient Therapy:    Past Medical History:  Past Medical History:  Diagnosis Date   Anxiety    COPD (chronic obstructive pulmonary disease) (HCC)    Depression    diet controlled diabetes    former   GERD (gastroesophageal reflux disease)    High cholesterol    Hypertension    Suicidal ideation     Past Surgical History:  Procedure Laterality Date   ABDOMINAL SURGERY     APPENDECTOMY     LEG SURGERY     when 12. Patient reports he was in tractor accident and leg had to be repaired   MENISCUS REPAIR     VASCULAR SURGERY     Family History: History reviewed. No pertinent family history. Family Psychiatric  History: not noted Social History:  Social History   Substance and Sexual Activity  Alcohol Use Not Currently     Social History   Substance and Sexual Activity  Drug Use Yes   Types: Methamphetamines   Comment: last used 4 days ago    Social History   Socioeconomic History   Marital status: Divorced    Spouse name: Not on file   Number of children: Not on file   Years of education: Not on file   Highest education level: Not on file  Occupational History   Not on file  Tobacco Use   Smoking status: Every Day    Packs/day: 1.50    Types: Cigarettes   Smokeless tobacco: Never  Vaping Use  Vaping Use: Never used  Substance and Sexual Activity   Alcohol use: Not Currently   Drug use: Yes    Types: Methamphetamines    Comment: last used 4 days ago   Sexual activity: Not Currently  Other Topics Concern   Not on file  Social History Narrative   Not on file   Social Determinants of Health   Financial Resource Strain: Not on file  Food Insecurity: Not on file  Transportation Needs: Not on file  Physical Activity: Not on file  Stress: Not on  file  Social Connections: Not on file   Additional Social History:    Allergies:   Allergies  Allergen Reactions   Pineapple Shortness Of Breath and Rash    Labs:  Results for orders placed or performed during the hospital encounter of 12/08/21 (from the past 48 hour(s))  Rapid urine drug screen (hospital performed)     Status: Abnormal   Collection Time: 12/08/21  1:10 AM  Result Value Ref Range   Opiates NONE DETECTED NONE DETECTED   Cocaine NONE DETECTED NONE DETECTED   Benzodiazepines NONE DETECTED NONE DETECTED   Amphetamines POSITIVE (A) NONE DETECTED   Tetrahydrocannabinol NONE DETECTED NONE DETECTED   Barbiturates NONE DETECTED NONE DETECTED    Comment: (NOTE) DRUG SCREEN FOR MEDICAL PURPOSES ONLY.  IF CONFIRMATION IS NEEDED FOR ANY PURPOSE, NOTIFY LAB WITHIN 5 DAYS.  LOWEST DETECTABLE LIMITS FOR URINE DRUG SCREEN Drug Class                     Cutoff (ng/mL) Amphetamine and metabolites    1000 Barbiturate and metabolites    200 Benzodiazepine                 200 Tricyclics and metabolites     300 Opiates and metabolites        300 Cocaine and metabolites        300 THC                            50 Performed at [facility], [address], [place], [place] [address]   Comprehensive metabolic panel     Status: Abnormal   Collection Time: 12/08/21  1:29 AM  Result Value Ref Range   Sodium 136 135 - 145 mmol/L   Potassium 3.6 3.5 - 5.1 mmol/L   Chloride 104 98 - 111 mmol/L   CO2 23 22 - 32 mmol/L   Glucose, Bld 90 70 - 99 mg/dL    Comment: Glucose reference range applies only to samples taken after fasting for at least 8 hours.   BUN 12 6 - 20 mg/dL   Creatinine, Ser 4.31 0.61 - 1.24 mg/dL   Calcium 8.9 8.9 - 54.0 mg/dL   Total Protein 6.8 6.5 - 8.1 g/dL   Albumin 4.1 3.5 - 5.0 g/dL   AST 77 (H) 15 - 41 U/L   ALT 49 (H) 0 - 44 U/L   Alkaline Phosphatase 51 38 - 126 U/L   Total Bilirubin 1.3 (H) 0.3 - 1.2 mg/dL   GFR, Estimated >08 >67 mL/min     Comment: (NOTE) Calculated using the CKD-EPI Creatinine Equation (2021)    Anion gap 9 5 - 15    Comment: Performed at [facility], [address], [place], [place] [address]  Ethanol     Status: None   Collection Time: 12/08/21  1:29 AM  Result Value Ref  Range   Alcohol, Ethyl (B) <10 <10 mg/dL    Comment: (NOTE) Lowest detectable limit for serum alcohol is 10 mg/dL.  For medical purposes only. Performed at [facility], [address] [place], [place] [address]   Salicylate level     Status: Abnormal   Collection Time: 12/08/21  1:29 AM  Result Value Ref Range   Salicylate Lvl <7.0 (L) 7.0 - 30.0 mg/dL    Comment: Performed at [facility], [address] [place], [place] [address]  Acetaminophen level     Status: Abnormal   Collection Time: 12/08/21  1:29 AM  Result Value Ref Range   Acetaminophen (Tylenol), Serum <10 (L) 10 - 30 ug/mL    Comment: (NOTE) Therapeutic concentrations vary significantly. A range of 10-30 ug/mL  may be an effective concentration for many patients. However, some  are best treated at concentrations outside of this range. Acetaminophen concentrations >150 ug/mL at 4 hours after ingestion  and >50 ug/mL at 12 hours after ingestion are often associated with  toxic reactions.  Performed at [facility], [address] [place], [place] [address]   cbc     Status: None   Collection Time: 12/08/21  1:29 AM  Result Value Ref Range   WBC 10.2 4.0 - 10.5 K/uL   RBC 4.95 4.22 - 5.81 MIL/uL   Hemoglobin 15.4 13.0 - 17.0 g/dL   HCT 62.943.0 52.839.0 - 41.352.0 %   MCV 86.9 80.0 - 100.0 fL   MCH 31.1 26.0 - 34.0 pg   MCHC 35.8 30.0 - 36.0 g/dL   RDW 24.412.1 01.011.5 - 27.215.5 %   Platelets 283 150 - 400 K/uL   nRBC 0.0 0.0 - 0.2 %    Comment: Performed at [facility], [address] [place], [place] [address]  Resp Panel by RT-PCR (Flu A&B, Covid) Anterior Nasal Swab     Status: None   Collection Time: 12/08/21  2:47 AM   Specimen: Anterior Nasal Swab  Result Value Ref  Range   SARS Coronavirus 2 by RT PCR NEGATIVE NEGATIVE    Comment: (NOTE) SARS-CoV-2 target nucleic acids are NOT DETECTED.  The SARS-CoV-2 RNA is generally detectable in upper respiratory specimens during the acute phase of infection. The lowest concentration of SARS-CoV-2 viral copies this assay can detect is 138 copies/mL. A negative result does not preclude SARS-Cov-2 infection and should not be used as the sole basis for treatment or other patient management decisions. A negative result may occur with  improper specimen collection/handling, submission of specimen other than nasopharyngeal swab, presence of viral mutation(s) within the areas targeted by this assay, and inadequate number of viral copies(<138 copies/mL). A negative result must be combined with clinical observations, patient history, and epidemiological information. The expected result is Negative.  Fact Sheet for Patients:  BloggerCourse.comhttps://www.fda.gov/media/152166/download  Fact Sheet for Healthcare Providers:  SeriousBroker.ithttps://www.fda.gov/media/152162/download  This test is no t yet approved or cleared by the [place] FDA and  has been authorized for detection and/or diagnosis of SARS-CoV-2 by FDA under an Emergency Use Authorization (EUA). This EUA will remain  in effect (meaning this test can be used) for the duration of the COVID-19 declaration under Section 564(b)(1) of the Act, 21 U.S.C.section 360bbb-3(b)(1), unless the authorization is terminated  or revoked sooner.       Influenza A by PCR NEGATIVE NEGATIVE   Influenza B by PCR NEGATIVE NEGATIVE    Comment: (NOTE) The Xpert Xpress SARS-CoV-2/FLU/RSV plus assay is intended as an aid  in the diagnosis of influenza from Nasopharyngeal swab specimens and should not be used as a sole basis for treatment. Nasal washings and aspirates are unacceptable for Xpert Xpress SARS-CoV-2/FLU/RSV testing.  Fact Sheet for  Patients: BloggerCourse.com  Fact Sheet for Healthcare Providers: SeriousBroker.it  This test is not yet approved or cleared by the [place] FDA and has been authorized for detection and/or diagnosis of SARS-CoV-2 by FDA under an Emergency Use Authorization (EUA). This EUA will remain in effect (meaning this test can be used) for the duration of the COVID-19 declaration under Section 564(b)(1) of the Act, 21 U.S.C. section 360bbb-3(b)(1), unless the authorization is terminated or revoked.  Performed at [facility], [address] [place], [place] [address]   CBG monitoring, ED     Status: Abnormal   Collection Time: 12/08/21  8:24 AM  Result Value Ref Range   Glucose-Capillary 110 (H) 70 - 99 mg/dL    Comment: Glucose reference range applies only to samples taken after fasting for at least 8 hours.  CBG monitoring, ED     Status: Abnormal   Collection Time: 12/08/21 12:09 PM  Result Value Ref Range   Glucose-Capillary 120 (H) 70 - 99 mg/dL    Comment: Glucose reference range applies only to samples taken after fasting for at least 8 hours.    Medications:  Current Facility-Administered Medications  Medication Dose Route Frequency Provider Last Rate Last Admin   acetaminophen (TYLENOL) tablet 650 mg  650 mg Oral Q4H PRN Dione Booze, MD   650 mg at 12/08/21 0823   alum & mag hydroxide-simeth (MAALOX/MYLANTA) 200-200-20 MG/5ML suspension 30 mL  30 mL Oral Q6H PRN Dione Booze, MD       nicotine (NICODERM CQ - dosed in mg/24 hours) patch 21 mg  21 mg Transdermal Daily Dione Booze, MD       ondansetron [facility]) tablet 4 mg  4 mg Oral Q8H PRN Dione Booze, MD       Current Outpatient Medications  Medication Sig Dispense Refill   ARIPiprazole (ABILIFY) 15 MG tablet Take by mouth. (Patient not taking: Reported on 11/26/2020)     escitalopram (LEXAPRO) 10 MG tablet Take 10 mg by mouth daily. (Patient not taking: Reported on  11/17/2021)     gabapentin (NEURONTIN) 300 MG capsule Take 600 mg by mouth 3 (three) times daily. (Patient not taking: Reported on 11/17/2021)     metFORMIN (GLUCOPHAGE) 500 MG tablet Take 1 tablet (500 mg total) by mouth daily with breakfast. (Patient not taking: Reported on 11/26/2020) 30 tablet 0   nicotine (NICODERM CQ - DOSED IN MG/24 HOURS) 14 mg/24hr patch Place onto the skin. (Patient not taking: Reported on 11/26/2020)     pantoprazole (PROTONIX) 20 MG tablet Take 1 tablet by mouth daily. (Patient not taking: Reported on 11/26/2020)     traZODone (DESYREL) 50 MG tablet Take 50 mg by mouth at bedtime. (Patient not taking: Reported on 11/17/2021)      Musculoskeletal: Strength & Muscle Tone: within normal limits Gait & Station: normal Patient leans: N/A  Psychiatric Specialty Exam:  Presentation  General Appearance: Appropriate for Environment; Casual Eye Contact:Good Speech:Clear and Coherent; Normal Rate Speech Volume:Normal Handedness:No data recorded  Mood and Affect  Mood:Euthymic Affect:Congruent  Thought Process  Thought Processes:Coherent; Goal Directed Descriptions of Associations:Intact  Orientation:Full (Time, Place and Person)  Thought Content:Logical  History of Schizophrenia/Schizoaffective disorder:No  Duration of Psychotic Symptoms:No data recorded Hallucinations:Hallucinations: None  Ideas of Reference:None  Suicidal Thoughts:Suicidal Thoughts: No  Homicidal Thoughts:Homicidal Thoughts: No   Sensorium  Memory:Immediate Good; Recent Good; Remote Good Judgment:Good Insight:Good  Executive Functions  Concentration:Good Attention Span:Good Recall:Good Fund of Knowledge:Good Language:Good  Psychomotor Activity  Psychomotor Activity:Psychomotor Activity: Normal  Assets  Assets:Communication Skills; Desire for Improvement; Housing; Physical Health; Resilience  Sleep  Sleep:Sleep: Good   Physical Exam: Physical Exam Vitals and nursing note  reviewed.  Constitutional:      General: He is not in acute distress.    Appearance: He is normal weight. He is not ill-appearing or toxic-appearing.  HENT:     Head: Normocephalic.     Nose: Nose normal.     Mouth/Throat:     Mouth: Mucous membranes are moist.     Pharynx: Oropharynx is clear.  Eyes:     Pupils: Pupils are equal, round, and reactive to light.  Cardiovascular:     Rate and Rhythm: Normal rate.     Pulses: Normal pulses.  Pulmonary:     Effort: Pulmonary effort is normal.  Abdominal:     Palpations: Abdomen is soft.  Musculoskeletal:        General: Normal range of motion.     Cervical back: Normal range of motion.  Skin:    General: Skin is warm and dry.  Neurological:     Mental Status: He is alert and oriented to person, place, and time.  Psychiatric:        Attention and Perception: Attention and perception normal.        Mood and Affect: Mood and affect normal.        Speech: Speech normal.        Behavior: Behavior is cooperative.        Thought Content: Thought content normal. Thought content is not paranoid or delusional. Thought content does not include homicidal or suicidal ideation. Thought content does not include homicidal or suicidal plan.        Cognition and Memory: Cognition and memory normal.        Judgment: Judgment normal.    Review of Systems  Psychiatric/Behavioral:  Positive for substance abuse. Negative for depression and suicidal ideas.   All other systems reviewed and are negative.  Blood pressure (!) 146/93, pulse 64, temperature 97.8 F (36.6 C), temperature source Oral, resp. rate 18, height 5\' 11"  (1.803 m), weight 94.8 kg, SpO2 97 %. Body mass index is 29.15 kg/m.  Treatment Plan Summary: Plan Patient requesting discharge; denies any active suicidal or homicidal ideations. Is not actively psychotic and declines assistance with substance abuse treatment facilities at this time. Resources for local mental health and substance  abuse treatment centers placed in AVS.   Disposition: No evidence of imminent risk to self or others at present.   Patient does not meet criteria for psychiatric inpatient admission. Supportive therapy provided about ongoing stressors. Discussed crisis plan, support from social network, calling 911, coming to the Emergency Department, and calling Suicide Hotline.  This service was provided via telemedicine using a 2-way, interactive audio and video technology.  Names of all persons participating in this telemedicine service and their role in this encounter. Name: Role: PMHNP  Name: Maxie Barb Role: Attending MD  Name: Phineas Inches Role: patient  Name:  Role:     [name], NP 12/08/2021 1:55 PM

## 2021-12-09 ENCOUNTER — Inpatient Hospital Stay (HOSPITAL_COMMUNITY)
Admission: AD | Admit: 2021-12-09 | Discharge: 2021-12-14 | DRG: 885 | Disposition: A | Discharge status: Home / Self Care | Payer: 59 | Source: Intra-hospital | Attending: Emergency Medicine | Admitting: Emergency Medicine

## 2021-12-09 ENCOUNTER — Encounter (HOSPITAL_COMMUNITY): Payer: Self-pay

## 2021-12-09 ENCOUNTER — Encounter (HOSPITAL_COMMUNITY): Payer: Self-pay | Admitting: Psychiatry

## 2021-12-09 ENCOUNTER — Other Ambulatory Visit: Payer: Self-pay

## 2021-12-09 ENCOUNTER — Emergency Department (HOSPITAL_COMMUNITY)
Admission: EM | Admit: 2021-12-09 | Discharge: 2021-12-09 | Disposition: A | Discharge status: Other Acute Inpatient Hospital | Payer: 59 | Attending: Emergency Medicine | Admitting: Emergency Medicine

## 2021-12-09 ENCOUNTER — Other Ambulatory Visit: Payer: Self-pay | Admitting: Psychiatry

## 2021-12-09 DIAGNOSIS — I10 Essential (primary) hypertension: Secondary | ICD-10-CM | POA: Diagnosis not present

## 2021-12-09 DIAGNOSIS — E7800 Pure hypercholesterolemia, unspecified: Secondary | ICD-10-CM | POA: Diagnosis present

## 2021-12-09 DIAGNOSIS — J449 Chronic obstructive pulmonary disease, unspecified: Secondary | ICD-10-CM | POA: Diagnosis present

## 2021-12-09 DIAGNOSIS — G4700 Insomnia, unspecified: Secondary | ICD-10-CM | POA: Diagnosis present

## 2021-12-09 DIAGNOSIS — Z833 Family history of diabetes mellitus: Secondary | ICD-10-CM

## 2021-12-09 DIAGNOSIS — E119 Type 2 diabetes mellitus without complications: Secondary | ICD-10-CM | POA: Insufficient documentation

## 2021-12-09 DIAGNOSIS — Z1339 Encounter for screening examination for other mental health and behavioral disorders: Secondary | ICD-10-CM | POA: Diagnosis not present

## 2021-12-09 DIAGNOSIS — F332 Major depressive disorder, recurrent severe without psychotic features: Secondary | ICD-10-CM | POA: Diagnosis not present

## 2021-12-09 DIAGNOSIS — Z560 Unemployment, unspecified: Secondary | ICD-10-CM | POA: Diagnosis not present

## 2021-12-09 DIAGNOSIS — Z9151 Personal history of suicidal behavior: Secondary | ICD-10-CM | POA: Diagnosis not present

## 2021-12-09 DIAGNOSIS — Z5900 Homelessness unspecified: Secondary | ICD-10-CM

## 2021-12-09 DIAGNOSIS — F419 Anxiety disorder, unspecified: Secondary | ICD-10-CM | POA: Diagnosis not present

## 2021-12-09 DIAGNOSIS — Z91018 Allergy to other foods: Secondary | ICD-10-CM | POA: Diagnosis not present

## 2021-12-09 DIAGNOSIS — Z8249 Family history of ischemic heart disease and other diseases of the circulatory system: Secondary | ICD-10-CM

## 2021-12-09 DIAGNOSIS — R45850 Homicidal ideations: Secondary | ICD-10-CM | POA: Diagnosis not present

## 2021-12-09 DIAGNOSIS — R45851 Suicidal ideations: Secondary | ICD-10-CM | POA: Diagnosis present

## 2021-12-09 DIAGNOSIS — Z7984 Long term (current) use of oral hypoglycemic drugs: Secondary | ICD-10-CM | POA: Diagnosis not present

## 2021-12-09 DIAGNOSIS — F17210 Nicotine dependence, cigarettes, uncomplicated: Secondary | ICD-10-CM | POA: Diagnosis present

## 2021-12-09 DIAGNOSIS — K219 Gastro-esophageal reflux disease without esophagitis: Secondary | ICD-10-CM | POA: Diagnosis not present

## 2021-12-09 DIAGNOSIS — F1510 Other stimulant abuse, uncomplicated: Secondary | ICD-10-CM | POA: Diagnosis not present

## 2021-12-09 DIAGNOSIS — R69 Illness, unspecified: Secondary | ICD-10-CM | POA: Diagnosis not present

## 2021-12-09 LAB — RAPID URINE DRUG SCREEN, HOSP PERFORMED
Amphetamines: POSITIVE — AB
Barbiturates: NOT DETECTED
Benzodiazepines: NOT DETECTED
Cocaine: NOT DETECTED
Opiates: NOT DETECTED
Tetrahydrocannabinol: NOT DETECTED

## 2021-12-09 MED ORDER — ACETAMINOPHEN 325 MG PO TABS
650.0000 mg | ORAL_TABLET | Freq: Four times a day (QID) | ORAL | Status: DC | PRN
  Administered 2021-12-10: 650 mg via ORAL
  Filled 2021-12-09: qty 2

## 2021-12-09 MED ORDER — ALUM & MAG HYDROXIDE-SIMETH 200-200-20 MG/5ML PO SUSP: 30.0000 mL | ORAL | Status: DC | PRN

## 2021-12-09 MED ORDER — ACETAMINOPHEN 325 MG PO TABS
650.0000 mg | ORAL_TABLET | Freq: Four times a day (QID) | ORAL | Status: DC | PRN
  Administered 2021-12-09: 650 mg via ORAL
  Filled 2021-12-09: qty 2

## 2021-12-09 MED ORDER — HYDROXYZINE HCL 25 MG PO TABS
25.0000 mg | ORAL_TABLET | Freq: Four times a day (QID) | ORAL | Status: DC | PRN
  Administered 2021-12-11 – 2021-12-13 (×3): 25 mg via ORAL
  Filled 2021-12-09: qty 10
  Filled 2021-12-09 (×3): qty 1

## 2021-12-09 MED ORDER — TRAZODONE HCL 50 MG PO TABS
50.0000 mg | ORAL_TABLET | Freq: Every evening | ORAL | Status: DC | PRN
  Administered 2021-12-13: 50 mg via ORAL
  Filled 2021-12-09 (×2): qty 1

## 2021-12-09 MED ORDER — MAGNESIUM HYDROXIDE 400 MG/5ML PO SUSP: 30.0000 mL | Freq: Every day | ORAL | Status: DC | PRN

## 2021-12-09 NOTE — Progress Notes (Deleted)
Adult Psychoeducational Group Note  Date:  12/09/2021 Time:  8:20 PM  Group Topic/Focus:  Recovery Goals:   The focus of this group is to identify appropriate goals for recovery and establish a plan to achieve them.  Participation Level:  Active  Participation Quality:  Appropriate  Affect:  Appropriate  Cognitive:  Appropriate  Insight: Appropriate  Engagement in Group:  Engaged  Modes of Intervention:  Discussion  Additional Comments:  [name] said the one thing that describe his day kindness.  Willie Herman 12/09/2021, 8:20 PM

## 2021-12-09 NOTE — ED Provider Notes (Signed)
[facility] EMERGENCY DEPARTMENT Provider Note   CSN: [id number] Arrival date & time: 12/09/21  0759     History  Chief Complaint  Patient presents with   Psychiatric Evaluation    Willie Herman is a 46 y.o. male with history of substance induced mood disorders, amphetamine use, diabetes, suicidal ideation who presents again to the emergency department with recurrence of suicidal ideation and homicidal ideation.  Patient was discharged from the hospital yesterday after being cleared by psych for suicidal ideation.  He states that he went home and was unable to find his girlfriend.  He then stayed outside all night trying to find her.  When he went back to her house, she saw another truck with a man in her driveway.  He now endorsing homicidal ideation with a plan to "beat that guided death".  He also endorses suicidal ideation without an active plan to hurt himself.  Denies auditory visual hallucinations and all other physical complaints.  HPI     Home Medications Prior to Admission medications   Medication Sig Start Date End Date Taking? Authorizing Provider  ARIPiprazole (ABILIFY) 15 MG tablet Take by mouth. Patient not taking: Reported on 11/26/2020 10/16/20   [provider]  escitalopram (LEXAPRO) 10 MG tablet Take 10 mg by mouth daily. Patient not taking: Reported on 11/17/2021 06/24/20   [provider]  gabapentin (NEURONTIN) 300 MG capsule Take 600 mg by mouth 3 (three) times daily. Patient not taking: Reported on 11/17/2021    [provider]  metFORMIN (GLUCOPHAGE) 500 MG tablet Take 1 tablet (500 mg total) by mouth daily with breakfast. Patient not taking: Reported on 11/26/2020 05/15/20   Ethelene Hal, NP  nicotine (NICODERM CQ - DOSED IN MG/24 HOURS) 14 mg/24hr patch Place onto the skin. Patient not taking: Reported on 11/26/2020    [provider]  pantoprazole (PROTONIX) 20 MG tablet Take 1 tablet by mouth daily. Patient not taking:  Reported on 11/26/2020    [provider]  traZODone (DESYREL) 50 MG tablet Take 50 mg by mouth at bedtime. Patient not taking: Reported on 11/17/2021 10/16/20   [provider]  blood glucose meter kit and supplies KIT Dispense based on patient and insurance preference. Use up to four times daily as directed. (FOR ICD-9 250.00, 250.01). 05/14/20 09/21/20  Ethelene Hal, NP      Allergies    Pineapple    Review of Systems   Review of Systems  Constitutional:  Negative for fever.  Respiratory:  Negative for shortness of breath.   Cardiovascular:  Negative for chest pain.  Gastrointestinal:  Negative for abdominal pain.  Psychiatric/Behavioral:  Positive for suicidal ideas.     Physical Exam Updated Vital Signs BP (!) 143/84 (BP Location: Right Arm)   Pulse (!) 57   Temp 98.6 F (37 C) (Oral)   Resp 12   Ht 5' 11" (1.803 m)   Wt 93 kg   SpO2 100%   BMI 28.59 kg/m  Physical Exam Vitals and nursing note reviewed.  Constitutional:      General: He is not in acute distress.    Appearance: He is not ill-appearing.  HENT:     Head: Atraumatic.  Eyes:     Conjunctiva/sclera: Conjunctivae normal.  Cardiovascular:     Rate and Rhythm: Normal rate and regular rhythm.     Pulses: Normal pulses.     Heart sounds: No murmur heard. Pulmonary:     Effort: Pulmonary effort  is normal. No respiratory distress.     Breath sounds: Normal breath sounds.  Abdominal:     General: Abdomen is flat. There is no distension.     Palpations: Abdomen is soft.     Tenderness: There is no abdominal tenderness.  Musculoskeletal:        General: Normal range of motion.     Cervical back: Normal range of motion.  Skin:    General: Skin is warm and dry.     Capillary Refill: Capillary refill takes less than 2 seconds.  Neurological:     General: No focal deficit present.     Mental Status: He is alert.  Psychiatric:        Attention and Perception: He does not perceive  auditory or visual hallucinations.        Mood and Affect: Mood normal.        Thought Content: Thought content is not paranoid. Thought content includes homicidal and suicidal ideation.     ED Results / Procedures / Treatments   Labs (all labs ordered are listed, but only abnormal results are displayed) Labs Reviewed  RAPID URINE DRUG SCREEN, HOSP PERFORMED - Abnormal; Notable for the following components:      Result Value   Amphetamines POSITIVE (*)    All other components within normal limits    EKG None  Radiology No results found.  Procedures Procedures    Medications Ordered in ED Medications - No data to display  ED Course/ Medical Decision Making/ A&P                           Medical Decision Making Amount and/or Complexity of Data Reviewed Labs: ordered.   46 year old male presents emergency department for complaints of homicidal ideation with a plan and suicidal ideation.  Vitals are without significant abnormality.  On exam, he is labile and withdrawn.  Physical exam overall benign.  He did have psychiatric clearance yesterday, so I will forego additional labs and work-up aside from UDS and have consult with TTS again for new complaint of homicidal ideation.  UDS positive for amphetamines.  Pending TTS consult. Final Clinical Impression(s) / ED Diagnoses Final diagnoses:  Suicidal ideation  Homicidal ideation  Amphetamine abuse [facility])    Rx / DC Orders ED Discharge Orders     None         Tonye Pearson, PA-C 12/09/21 1113    Wynona Dove A, DO 12/10/21 1406

## 2021-12-09 NOTE — Progress Notes (Signed)
   12/09/21 2219  Psych Admission Type (Psych Patients Only)  Admission Status Voluntary  Psychosocial Assessment  Patient Complaints Anxiety  Eye Contact Fair  Facial Expression Animated  Affect Appropriate to circumstance  Speech Logical/coherent  Interaction Minimal  Motor Activity Other (Comment) (WDL)  Appearance/Hygiene Unremarkable  Behavior Characteristics Appropriate to situation  Mood Depressed  Thought Process  Coherency WDL  Content WDL  Delusions None reported or observed  Perception WDL  Hallucination None reported or observed  Judgment Poor  Confusion None  Danger to Self  Current suicidal ideation? Denies  Self-Injurious Behavior No self-injurious ideation or behavior indicators observed or expressed   Agreement Not to Harm Self Yes  Description of Agreement verbal  Danger to Others  Danger to Others None reported or observed
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
## 2021-12-09 NOTE — Progress Notes (Signed)
Willie Herman did not attend wrap up group. 

## 2021-12-09 NOTE — Tx Team (Signed)
Initial Treatment Plan 12/09/2021 8:43 PM Willie Herman [id number]    PATIENT STRESSORS: Financial difficulties   Health problems   Occupational concerns   Substance abuse     PATIENT STRENGTHS: Motivation for treatment/growth  Supportive family/friends    PATIENT IDENTIFIED PROBLEMS: Suicide ideation  Depression  Anxiety  "Find housing"  "Stop using drugs"             DISCHARGE CRITERIA:  Ability to meet basic life and health needs Adequate post-discharge living arrangements Improved stabilization in mood, thinking, and/or behavior Medical problems require only outpatient monitoring Verbal commitment to aftercare and medication compliance  PRELIMINARY DISCHARGE PLAN: Attend aftercare/continuing care group Attend PHP/IOP Outpatient therapy Placement in alternative living arrangements  PATIENT/FAMILY INVOLVEMENT: This treatment plan has been presented to and reviewed with the patient, Willie Herman, and/or family member.  The patient and family have been given the opportunity to ask questions and make suggestions.  Wolfgang Phoenix, RN 12/09/2021, 8:43 PM

## 2021-12-10 DIAGNOSIS — F332 Major depressive disorder, recurrent severe without psychotic features: Secondary | ICD-10-CM | POA: Diagnosis not present

## 2021-12-10 MED ORDER — ACETAMINOPHEN 500 MG PO TABS
500.0000 mg | ORAL_TABLET | Freq: Four times a day (QID) | ORAL | Status: DC | PRN
  Administered 2021-12-13: 500 mg via ORAL
  Filled 2021-12-10: qty 1

## 2021-12-10 MED ORDER — NICOTINE 21 MG/24HR TD PT24
21.0000 mg | MEDICATED_PATCH | Freq: Every day | TRANSDERMAL | Status: DC
  Administered 2021-12-10 – 2021-12-14 (×5): 21 mg via TRANSDERMAL
  Filled 2021-12-10 (×7): qty 1
  Filled 2021-12-10: qty 7

## 2021-12-10 MED ORDER — ESCITALOPRAM OXALATE 10 MG PO TABS
10.0000 mg | ORAL_TABLET | Freq: Every day | ORAL | Status: DC
  Administered 2021-12-10 – 2021-12-14 (×5): 10 mg via ORAL
  Filled 2021-12-10 (×4): qty 1
  Filled 2021-12-10: qty 7
  Filled 2021-12-10 (×3): qty 1

## 2021-12-10 MED ORDER — ARIPIPRAZOLE 5 MG PO TABS
5.0000 mg | ORAL_TABLET | Freq: Every day | ORAL | Status: DC
  Administered 2021-12-10 – 2021-12-11 (×2): 5 mg via ORAL
  Filled 2021-12-10 (×5): qty 1

## 2021-12-10 MED ORDER — METHOCARBAMOL 500 MG PO TABS: 500.0000 mg | ORAL_TABLET | Freq: Three times a day (TID) | ORAL | Status: DC | PRN

## 2021-12-10 MED ORDER — NAPROXEN 500 MG PO TABS: 500.0000 mg | ORAL_TABLET | Freq: Two times a day (BID) | ORAL | Status: DC | PRN

## 2021-12-10 MED ORDER — PANTOPRAZOLE SODIUM 20 MG PO TBEC
20.0000 mg | DELAYED_RELEASE_TABLET | Freq: Every day | ORAL | Status: DC
  Administered 2021-12-10 – 2021-12-14 (×5): 20 mg via ORAL
  Filled 2021-12-10 (×2): qty 1
  Filled 2021-12-10: qty 7
  Filled 2021-12-10 (×5): qty 1

## 2021-12-10 MED ORDER — LOPERAMIDE HCL 2 MG PO CAPS: 2.0000 mg | ORAL_CAPSULE | ORAL | Status: DC | PRN

## 2021-12-10 MED ORDER — DICYCLOMINE HCL 20 MG PO TABS: 20.0000 mg | ORAL_TABLET | Freq: Four times a day (QID) | ORAL | Status: DC | PRN

## 2021-12-10 MED ORDER — ONDANSETRON 4 MG PO TBDP: 4.0000 mg | ORAL_TABLET | Freq: Four times a day (QID) | ORAL | Status: DC | PRN

## 2021-12-10 NOTE — B[facility] Group Notes (Signed)
Adult Psychoeducational Group  Date:  12/10/2021 Time:  1300-1400  Group Topic/Focus: Continuation of the group from Saturday. Looking at the lists that were created and talking about what needs to be done with the homework of 30 positives about themselves.                                     Talking about taking their power back and helping themselves to develop a positive self esteem.      Participation Quality:  did not attend  Markise Haymer A   
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
## 2021-12-10 NOTE — [facility] Group Notes (Signed)
Adult Psychoeducational Group Note Date:  12/10/2021 Time:  0900-1045 Group Topic/Focus: PROGRESSIVE RELAXATION. A group where deep breathing is taught and tensing and relaxation muscle groups is used. Imagery is used as well.  Pts are asked to imagine 3 pillars that hold them up when they are not able to hold themselves up and to share that with the group.  Participation Level:  did not attend  Willie Herman A   

## 2021-12-10 NOTE — Group Note (Deleted)
LCSW Group Therapy Note  Group Date: 12/10/2021 Start Time: 1000 End Time: 1001   Type of Therapy and Topic:  Group Therapy: Positive Affirmations  Participation Level:  {[facility] PARTICIPATION LEVEL:22264}   Description of Group:   This group addressed positive affirmation towards self and others.  Patients went around the room and identified two positive things about themselves and two positive things about a peer in the room.  Patients reflected on how it felt to share something positive with others, to identify positive things about themselves, and to hear positive things from others/ Patients were encouraged to have a daily reflection of positive characteristics or circumstances.   Therapeutic Goals: 1. Patients will verbalize two of their positive qualities 2. Patients will demonstrate empathy for others by stating two positive qualities about a peer in the group 3. Patients will verbalize their feelings when voicing positive self affirmations and when voicing positive affirmations of others 4. Patients will discuss the potential positive impact on their wellness/recovery of focusing on positive traits of self and others.  Summary of Patient Progress:  *** actively engaged in the discussion and . S*** was able***or not able to identify positive affirmations about ***self as well as other group members. Patient demonstrated *** insight into the subject matter, was respectful of peers, participated throughout the entire session.  Therapeutic Modalities:   Cognitive Behavioral Therapy Motivational Interviewing    Tyrion Glaude J Grossman-Orr, LCSWA 12/10/2021  8:49 AM    

## 2021-12-10 NOTE — Group Note (Signed)
LCSW Group Therapy Note  12/10/2021      Type of Therapy and Topic:  Group Therapy: Positive Affirmations   Description:   Group could not be held by social worker, but licensed RN did provide group.    CSW provided a handout for each patient, with the following information:   Positive Affirmation Worksheet  Programming your subconscious by repeating positive statements with focus, intention  and belief is a technique called positive affirmations. This Worksheet will walk you  through the process of creating your own positive affirmations.   Releasing Negative Feelings  It is believed that this process is more effective when incorporating and understanding the negative  feelings, or mental programs that you harbor within your subconscious regarding yourself. This first part  will help you identify your own negative beliefs. When you shine your conscious light on your negative  beliefs and understand that they are merely beliefs and not based on reality, you can then utilize your  positive affirmations to overcome such beliefs and focus the rudder of your own life. Write as many negative beliefs down as you feel apply to your feelings about yourself.  Use the following prompts as a guide: I never. Nobody else. I'm the only one that. I am not. I Sherod't. I Duglas't want. I hate. I can't. Identifying Wants  Now, considering each of the negative feelings that you wrote down about yourself, write  a list of what you really want or deserve being very specific.   Creating Affirmations  From the previous exercise, distill your wants to a list of your desires. Write down a list of  your positive affirmations. Use the following prompts as a guide: I am. I welcome. I deserve. I choose. I believe. I trust. I have. I know. I feel. I create. I LOVE. One of the most powerful affirmations starts with I am.  I am.  Vocalize Affirmations Daily  Now that you have your list of  affirmations repeat them out loud to yourself several times each  day. When you say them, focus on their meanings and visualize your life as though your  affirmations have already become reality.    Therapeutic Modalities:   Activity  Jaymen Fetch J Grossman-Orr, LCSW .  

## 2021-12-10 NOTE — Progress Notes (Addendum)
D: Pt presents with a depressed mood- has  remained in bed for most of the shift, stating that he didn't feel like being around anyone. Pt did not attend groups despite encouragement from staff. Pt has been compliant with meds, and has been eating and drinking. Pt endorsed passive SI- with no plan- stated, "I Celvin't feel like I have anything to live for." Pt agrees to contact staff before acting on any harmful thoughts.  A. Labs and vitals monitored. Pt given and educated on medications. Pt supported emotionally and encouraged to express concerns and ask questions.   R. Pt remains safe with 15 minute checks. Will continue POC.    12/10/21 1600  Psych Admission Type (Psych Patients Only)  Admission Status Voluntary  Psychosocial Assessment  Patient Complaints Depression  Eye Contact Fair  Facial Expression Sad  Affect Appropriate to circumstance  Speech Logical/coherent  Interaction Minimal  Motor Activity Slow  Appearance/Hygiene Poor hygiene  Behavior Characteristics Cooperative;Appropriate to situation  Mood Depressed  Thought Process  Coherency WDL  Content WDL  Delusions None reported or observed  Perception WDL  Hallucination None reported or observed  Judgment Poor  Confusion None  Danger to Self  Current suicidal ideation? Passive  Self-Injurious Behavior No self-injurious ideation or behavior indicators observed or expressed   Agreement Not to Harm Self Yes  Description of Agreement verbal contract  Danger to Others  Danger to Others None reported or observed
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
## 2021-12-10 NOTE — Progress Notes (Signed)
Adult Psychoeducational Group Note  Date:  12/10/2021 Time:  8:50 PM  Group Topic/Focus:  Wrap-Up Group:   The focus of this group is to help patients review their daily goal of treatment and discuss progress on daily workbooks.  Participation Level:  Did Not Attend  Participation Quality:  Did Not Attend  Affect:  Did Not Attend  Cognitive:  Did Not Attend  Insight: Did Not Attend  Engagement in Group:  Did Not Attend  Modes of Intervention:  Did Not Attend  Additional Comments:   Pt was encouraged to attend group discussion but refused   Gerhard Perches 12/10/2021, 8:50 PM

## 2021-12-10 NOTE — Progress Notes (Signed)
   12/10/21 2045  Psych Admission Type (Psych Patients Only)  Admission Status Voluntary  Psychosocial Assessment  Patient Complaints Depression  Eye Contact Brief  Facial Expression Flat  Affect Appropriate to circumstance  Speech Logical/coherent  Interaction Minimal  Motor Activity Other (Comment) (WDL)  Appearance/Hygiene Unremarkable  Behavior Characteristics Appropriate to situation  Mood Depressed  Thought Process  Coherency WDL  Content WDL  Delusions None reported or observed  Perception WDL  Hallucination None reported or observed  Judgment Poor  Confusion None  Danger to Self  Current suicidal ideation? Denies  Self-Injurious Behavior No self-injurious ideation or behavior indicators observed or expressed   Agreement Not to Harm Self Yes  Description of Agreement verbal  Danger to Others  Danger to Others None reported or observed

## 2021-12-10 NOTE — [facility] Suicide Risk Assessment (Signed)
[facility] Suicide Risk Assessment   Nursing information obtained from:  Patient Demographic factors:  Male, Unemployed, Caucasian Current Mental Status:  Suicidal ideation indicated by patient, HI Loss Factors:  Financial problems / change in socioeconomic status, end of relationship with girlfriend Historical Factors:  Prior suicide attempts, substance use prior to admission, previous psychiatric diagnoses/treatments Risk Reduction Factors:  Has children  Total Time Spent in Direct Patient Care:  I personally spent 60 minutes on the unit in direct patient care. The direct patient care time included face-to-face time with the patient, reviewing the patient's chart, communicating with other professionals, and coordinating care. Greater than 50% of this time was spent in counseling or coordinating care with the patient regarding goals of hospitalization, psycho-education, and discharge planning needs.  Principal Problem: MDD (major depressive disorder), recurrent episode, severe ([place]) Diagnosis:  Principal Problem:   MDD (major depressive disorder), recurrent episode, severe ([place])  Subjective Data: Patient is a 46y/o male with h/o MDD, GAD, methamphetamine abuse, and previous suicide attempts, who presented voluntarily and unaccompanied to [facility] ED for SI and HI with fear he would act on his feelings. According to intake notes, he did not have a specific suicide plan but had plausible methods he was considering including overdosing on street drugs or shooting himself. He had HI toward a drug dealer. See H&P for details.  Continued Clinical Symptoms:  Alcohol Use Disorder Identification Test Final Score (AUDIT): 9 The "Alcohol Use Disorders Identification Test", Guidelines for Use in Primary Care, Second Edition.  World Pharmacologist [facility]). Score between 0-7:  no or low risk or alcohol related problems. Score between 8-15:  moderate risk of alcohol related problems. Score between  16-19:  high risk of alcohol related problems. Score 20 or above:  warrants further diagnostic evaluation for alcohol dependence and treatment.   CLINICAL FACTORS:   Depression:   Anhedonia Hopelessness Insomnia Severe Alcohol/Substance Abuse/Dependencies More than one psychiatric diagnosis Previous Psychiatric Diagnoses and Treatments  Musculoskeletal: Strength & Muscle Tone: within normal limits Gait & Station:  untested in bed Patient leans: N/A   Psychiatric Specialty Exam:   Presentation  General Appearance: Appropriate for Environment; Casual   Eye Contact:Fair   Speech:Clear and Coherent; Normal Rate   Speech Volume:Normal   Mood and Affect  Mood:Irritable, depressed   Affect:irritable, tearful at times, dysphoric   Thought Process  Thought Processes:Goal directed but ruminative at times about psychosocial stressors   Descriptions of Associations:Intact   Orientation:Full (Time, Place and Person)   Thought Content:Reports passive SI but contracts for safety and denies current HI; denies AVH, paranoia, delusions, ideas of reference or first rank symptoms   Hallucinations:Denied   Ideas of Reference:None   Suicidal Thoughts:SI contemplating a plan prior to admission but contracts for safety on the unit with residual passive SI today   Homicidal Thoughts:HI toward drug dealer prior to admission; denies current HI and contracts for safety on the unit   Sensorium  Memory:Immediate Good; Recent Good; Remote Good   Judgment:Fair   Insight:Fair     Executive Functions  Concentration:Good   Attention Span:Good   [place] of Knowledge:Good   Language:Good     Psychomotor Activity  Psychomotor Activity:Resting in bed, no tremor or akathisias   Assets  Assets:Communication, resilience, desire for improvement     Sleep  Total time unrecorded     Physical Exam Vitals and nursing note reviewed.  Constitutional:      Appearance:  Normal appearance.  HENT:     Head: Normocephalic and atraumatic.  Cardiovascular:     Rate and Rhythm: Normal rate and regular rhythm.  Pulmonary:     Effort: Pulmonary effort is normal.     Breath sounds: Normal breath sounds.  Skin:    General: Skin is warm and dry.  Neurological:     General: No focal deficit present.     Mental Status: He is alert.     Comments: CN 3-12 grossly intact; intact finger to nose; equal grip      Review of Systems  Constitutional:  Negative for chills, diaphoresis and fever.  HENT:  Negative for congestion.   Respiratory:  Negative for shortness of breath.   Cardiovascular:  Negative for chest pain.  Gastrointestinal:  Negative for constipation, diarrhea, nausea and vomiting.  Genitourinary:  Negative for dysuria.  Musculoskeletal:  Negative for myalgias.  Skin:  Negative for rash.  Neurological:  Negative for dizziness and headaches.    Blood pressure 133/83, pulse 61, temperature 98.4 F (36.9 C), temperature source Oral, resp. rate 18, height 5\' 11"  (1.803 m), weight 90 kg, SpO2 95 %. Body mass index is 27.67 kg/m.  COGNITIVE FEATURES THAT CONTRIBUTE TO RISK:  Thought constriction (tunnel vision)    SUICIDE RISK:   Moderate:  Frequent suicidal ideation with limited intensity, and duration, some specificity in terms of plans, no associated intent, good self-control, limited dysphoria/symptomatology, some risk factors present, and identifiable protective factors, including available and accessible social support.  PLAN OF CARE: see H&P  I certify that inpatient services furnished can reasonably be expected to improve the patient's condition.   , MD, FAPA 12/10/2021, 7:11 AM

## 2021-12-11 ENCOUNTER — Encounter (HOSPITAL_COMMUNITY): Payer: Self-pay

## 2021-12-11 DIAGNOSIS — F332 Major depressive disorder, recurrent severe without psychotic features: Secondary | ICD-10-CM | POA: Diagnosis not present

## 2021-12-11 LAB — TSH: TSH: 2.318 u[IU]/mL (ref 0.350–4.500)

## 2021-12-11 LAB — HEPATIC FUNCTION PANEL
ALT: 31 U/L (ref 0–44)
AST: 22 U/L (ref 15–41)
Albumin: 4 g/dL (ref 3.5–5.0)
Alkaline Phosphatase: 51 U/L (ref 38–126)
Bilirubin, Direct: 0.1 mg/dL (ref 0.0–0.2)
Indirect Bilirubin: 0.6 mg/dL (ref 0.3–0.9)
Total Bilirubin: 0.7 mg/dL (ref 0.3–1.2)
Total Protein: 6.8 g/dL (ref 6.5–8.1)

## 2021-12-11 LAB — LIPID PANEL
Cholesterol: 172 mg/dL (ref 0–200)
HDL: 32 mg/dL — ABNORMAL LOW (ref >40)
LDL Cholesterol: 113 mg/dL — ABNORMAL HIGH (ref 0–99)
Total CHOL/HDL Ratio: 5.4 RATIO
Triglycerides: 136 mg/dL (ref <150)
VLDL: 27 mg/dL (ref 0–40)

## 2021-12-11 LAB — PROTIME-INR
INR: 0.9 (ref 0.8–1.2)
Prothrombin Time: 12.1 seconds (ref 11.4–15.2)

## 2021-12-11 MED ORDER — ARIPIPRAZOLE 10 MG PO TABS
10.0000 mg | ORAL_TABLET | Freq: Every day | ORAL | Status: DC
  Administered 2021-12-12: 10 mg via ORAL
  Filled 2021-12-11 (×2): qty 1

## 2021-12-11 NOTE — B[facility] Counselor (Signed)
CSW provided the Pt with a packet that contains information including shelter and housing resources, free and reduced price food information, clothing resources, crisis center information, a [place] card, and suicide prevention information.    CSW also provided the Pt with [employer] for [facility] and the phone numbers to [place] and [place].

## 2021-12-11 NOTE — Progress Notes (Addendum)
[facility] MD Progress Note  12/11/2021 7:21 AM [facility]  MRN:  [id number]  Chief Complaint: suicidal ideation  Reason for Admission:  Will[facility] is a 46 y.o. male with a history of MDD, GAD, methamphetamine abuse, and previous suicide attempts, who was initially admitted for inpatient psychiatric hospitalization on 12/09/2021 for management of SI and HI. The patient is currently on Hospital Day 2.   Chart Review from last 24 hours:  The patient's chart was reviewed and nursing notes were reviewed. The patient's case was discussed in multidisciplinary team meeting. Per nursing notes, he was isolative yesterday and did not attend groups. He reported passive SI to staff but contracted for safety. Per [facility] he was compliant with scheduled medications and did receive Tylenol X1 PRN.   Information Obtained Today During Patient Interview: The patient was seen and evaluated on the unit. On assessment today the patient reports that he has not attended groups and does not want to be around people due to his present level of irritability. He states he did not sleep well overnight and was encouraged to work on sleep hygiene and to be out of bed and to not nap during the day. He reports fair appetite. He denies physical issues other than mild HA. He denies any signs of acute withdrawal or cravings. He reports he still has passive SI due to "having nothing to live for" and admits he has "ill thoughts" toward the drug dealer who is cheating with his girlfriend but denies thoughts of HI. He denies AVH, paranoia, ideas of reference or first rank symptoms. He was prompted to comply with labs. We discussed plans to titrate up on his Abilify to help with residual irritability and mood stabilization. He was encouraged to work with SW on sober living options for discharge.   Principal Problem: MDD (major depressive disorder), recurrent severe, without psychosis (HCC) Diagnosis: Principal Problem:   MDD (major depressive  disorder), recurrent severe, without psychosis (H[facility]) Active Problems:   Stimulant abuse (HCC)  Total Time Spent in Direct Patient Care:  I personally spent 30 minutes on the unit in direct patient care. The direct patient care time included face-to-face time with the patient, reviewing the patient's chart, communicating with other professionals, and coordinating care. Greater than 50% of this time was spent in counseling or coordinating care with the patient regarding goals of hospitalization, psycho-education, and discharge planning needs.  Past Psychiatric History:  Previous Psychiatric Diagnoses: stimulant use d/o, MDD, substance induced mood d/o, GAD Current / Past Mental Health Providers: Unclear who had been prescribing meds - denies having current therapist/psychiatrist Previous Psychological Evaluations: Yes  Past Psychiatric Hospitalizations: [facility] 7/22; [facility] 7/22[facility] 3/22; [facility] 2/22 twice per EHR - patient does not recall all previous admissions Past Suicide Attempts: OD on Trazodone 7/22 per EHR Past History of Homicidal Behaviors / Violent or Aggressive Behaviors: Denied History of Self-Mutilation: Denied Previous Participation in PHP/IOP or Residential Programs: Denied Past History of ECT / [employer] / VNS: Denied Past Psychotropic Medication Trials: Lexapro, Abilify, Trazodone, Neurontin, Celexa, Prozac, Xanax, Wellbutrin, Viibryd, Ativan, Ambien per EHR   Past Medical History:  Past Medical History:  Diagnosis Date   Anxiety    COPD (chronic obstructive pulmonary disease) (HCC)    Depression    diet controlled diabetes    former   GERD (gastroesophageal reflux disease)    High cholesterol    Hypertension    Suicidal ideation     Past Surgical History:  Procedure Laterality Date   ABDOMINAL SURGERY     APPENDECTOMY     LEG SURGERY     when 12. Patient reports he was in tractor accident and leg had to be repaired   MENISCUS REPAIR     VASCULAR SURGERY      Family History: PGM and father with DM; father, PGF and paternal uncle with heart disease   Family Psychiatric  History: Father had unknown mental health issues, addiction to "everything" and attempted suicide; denies completed suicides in the family    Social History:  Social History   Substance and Sexual Activity  Alcohol Use Not Currently     Social History   Substance and Sexual Activity  Drug Use Yes   Types: Methamphetamines   Comment: last used 4 days ago    Social History   Socioeconomic History   Marital status: Divorced    Spouse name: Not on file   Number of children: Not on file   Years of education: Not on file   Highest education level: Not on file  Occupational History   Not on file  Tobacco Use   Smoking status: Every Day    Packs/day: 1.50    Types: Cigarettes   Smokeless tobacco: Never  Vaping Use   Vaping Use: Never used  Substance and Sexual Activity   Alcohol use: Not Currently   Drug use: Yes    Types: Methamphetamines    Comment: last used 4 days ago   Sexual activity: Not Currently  Other Topics Concern   Not on file  Social History Narrative   Not on file   Social Determinants of Health   Financial Resource Strain: Not on file  Food Insecurity: Unknown (12/09/2021)   Hunger Vital Sign    Worried About Running Out of Food in the Last Year: Patient refused    Ran Out of Food in the Last Year: Patient refused  Transportation Needs: Unknown (12/09/2021)   PRAPARE - Administrator, Civil ServiceTransportation    Lack of Transportation (Medical): Patient refused    Lack of Transportation (Non-Medical): Patient refused  Physical Activity: Not on file  Stress: Not on file  Social Connections: Not on file    Sleep: Poor per patient report - 7 hours documented per staff  Appetite:  Fair  Current Medications: Current Facility-Administered Medications  Medication Dose Route Frequency Provider Last Rate Last Admin   acetaminophen (TYLENOL) tablet 500 mg  500 mg  Oral Q6H PRN Comer LocketSingleton, Pericles Carmicheal E, MD       alum & mag hydroxide-simeth (MAALOX/MYLANTA) 200-200-20 MG/5ML suspension 30 mL  30 mL Oral Q4H PRN Leevy-Johnson, Brooke A, NP       ARIPiprazole (ABILIFY) tablet 5 mg  5 mg Oral Daily Mason JimSingleton, Abrian Hanover E, MD   5 mg at 12/10/21 1253   dicyclomine (BENTYL) tablet 20 mg  20 mg Oral Q6H PRN Comer LocketSingleton, Vaughn Frieze E, MD       escitalopram (LEXAPRO) tablet 10 mg  10 mg Oral Daily Mason JimSingleton, Kamyra Schroeck E, MD   10 mg at 12/10/21 1253   hydrOXYzine (ATARAX) tablet 25 mg  25 mg Oral Q6H PRN Ajibola, Ene A, NP       loperamide (IMODIUM) capsule 2-4 mg  2-4 mg Oral PRN Mason JimSingleton, Allyn Bartelson E, MD       magnesium hydroxide (MILK OF MAGNESIA) suspension 30 mL  30 mL Oral Daily PRN Leevy-Johnson, Brooke A, NP       methocarbamol (ROBAXIN) tablet 500 mg  500 mg  Oral Q8H PRN Comer Locket, MD       naproxen (NAPROSYN) tablet 500 mg  500 mg Oral BID PRN Comer Locket, MD       nicotine (NICODERM CQ - dosed in mg/24 hours) patch 21 mg  21 mg Transdermal Daily Mason Jim, Teairra Millar E, MD   21 mg at 12/10/21 1311   ondansetron (ZOFRAN-ODT) disintegrating tablet 4 mg  4 mg Oral Q6H PRN Comer Locket, MD       pantoprazole (PROTONIX) EC tablet 20 mg  20 mg Oral Daily Mason Jim, Charnetta Wulff E, MD   20 mg at 12/10/21 0801   traZODone (DESYREL) tablet 50 mg  50 mg Oral QHS PRN,MR X 1 Ajibola, Ene A, NP        Lab Results:  Results for orders placed or performed during the hospital encounter of 12/09/21 (from the past 48 hour(s))  Rapid urine drug screen (hospital performed)     Status: Abnormal   Collection Time: 12/09/21  9:27 AM  Result Value Ref Range   Opiates NONE DETECTED NONE DETECTED   Cocaine NONE DETECTED NONE DETECTED   Benzodiazepines NONE DETECTED NONE DETECTED   Amphetamines POSITIVE (A) NONE DETECTED   Tetrahydrocannabinol NONE DETECTED NONE DETECTED   Barbiturates NONE DETECTED NONE DETECTED    Comment: (NOTE) DRUG SCREEN FOR MEDICAL PURPOSES ONLY.  IF CONFIRMATION IS NEEDED FOR ANY  PURPOSE, NOTIFY LAB WITHIN 5 DAYS.  LOWEST DETECTABLE LIMITS FOR URINE DRUG SCREEN Drug Class                     Cutoff (ng/mL) Amphetamine and metabolites    1000 Barbiturate and metabolites    200 Benzodiazepine                 200 Tricyclics and metabolites     300 Opiates and metabolites        300 Cocaine and metabolites        300 THC                            50 Performed at [facility] [address], [place], [place] [address]     Blood Alcohol level:  Lab Results  Component Value Date   [facility] <10 12/08/2021   ETH <10 12/03/2021    Metabolic Disorder Labs: Lab Results  Component Value Date   HGBA1C 5.9 (H) 11/16/2021   MPG 122.63 11/16/2021   MPG 128 08/20/2020   No results found for: "PROLACTIN" Lab Results  Component Value Date   CHOL 166 05/20/2020   TRIG 83 05/20/2020   HDL 34 (L) 05/20/2020   CHOLHDL 4.9 05/20/2020   VLDL 17 05/20/2020   LDLCALC 115 (H) 05/20/2020   LDLCALC 155 (H) 05/08/2020    Physical Findings:  Musculoskeletal: Strength & Muscle Tone: within normal limits Gait & Station: normal Patient leans: N/A  Psychiatric Specialty Exam:  Presentation  General Appearance: Casually dressed, fair hygiene  Eye Contact:Minimal  Speech:Clear and Coherent; Normal Rate  Speech Volume:Normal  Mood and Affect  Mood:Irritable  Affect:Congruent  Thought Process  Thought Processes:Ruminative about psychosocial stressors, concrete  Descriptions of Associations:Intact  Orientation:Full (Time, Place and Person)  Thought Content:Reports passive SI but contracts for safety; denies HI; denies AVH, paranoia or delusions  Hallucinations:Denied  Ideas of Reference:None  Suicidal Thoughts:Passive - no plan - contracts for safety  Homicidal Thoughts:Denied  Sensorium  Memory:Immediate Good; Recent Good; Remote  Good  Judgment:Fair - complying with meds  Insight:Shallow   Executive Functions  Concentration:Good  Attention  Span:Good  Recall:Good  Fund of Knowledge:Good  Language:Good   Psychomotor Activity  Psychomotor Activity:Ambulates with steady gait; no restlessness  Assets  Assets: Desire for improvement, resilience    Physical Exam Vitals and nursing note reviewed.  Constitutional:      Appearance: Normal appearance.  HENT:     Head: Normocephalic.  Pulmonary:     Effort: Pulmonary effort is normal.  Neurological:     General: No focal deficit present.     Mental Status: He is alert.    Review of Systems  Respiratory:  Negative for shortness of breath.   Cardiovascular:  Negative for chest pain.  Gastrointestinal:  Negative for constipation, diarrhea, nausea and vomiting.  Neurological:  Positive for headaches.   Blood pressure 133/89, pulse (!) 102, temperature 98.4 F (36.9 C), temperature source Oral, resp. rate 17, height 5\' 11"  (1.803 m), weight 90 kg, SpO2 98 %. Body mass index is 27.67 kg/m.   Treatment Plan Summary: Diagnoses / Active Problems: MDD recurrent severe without psychotic features (r/o SIMD) Methamphetamine abuse - r/o stimulant use d/o by hx GAD by hx   PLAN: Safety and Monitoring:             -- Voluntary admission to inpatient psychiatric unit for safety, stabilization and treatment             -- Daily contact with patient to assess and evaluate symptoms and progress in treatment             -- Patient's case to be discussed in multi-disciplinary team meeting             -- Observation Level : q15 minute checks             -- Vital signs:  q12 hours             -- Precautions: suicide, elopement, and assault   2. Psychiatric Diagnoses and Treatment:              MDD recurrent severe without psychotic features (r/o SIMD) GAD by hx -- Continue Lexapro 10mg  daily  -- Increase Abilify to 10mg  daily for residual irritability and mood stabilization QTC 429ms; lipid panel pending; A1c 5.9 11/16/21 -- Continue Trazodone 50mg  qhs PRN insomnia -- Continue  Vistaril 25mg  q6 hours PRN anxiety -- TSH Pending             -- Encouraged patient to participate in unit milieu and in scheduled group therapies                          Methamphetamine abuse - r/o stimulant use d/o by hx             -- UDS positive for amphetamines - will have PRNS available for potential withdrawal             -- Discussed residential rehab/SAIOP and he is not interested - discussed option of [facility], [facility], and [employer] and he will consider             -- Need for abstinence from illicit substances encouraged             -- Declines repeat HIV or hepatitis testing (HIV negative 11/17/21; Hepatitis panel negative 05/18/20)             -- Continue Protonix 20mg  daily for  GI prophylaxis                           3. Medical Issues Being Addressed:              Tobacco Use Disorder             -- Nicotine patch 21mg /24 hours ordered             -- Smoking cessation encouraged               Elevated AST and ALT and elevated total bili             -- LFT elevation appears chronic compared to previous labs (AST 75 and ALT 116 11/22/21 and AST 83  11/26/20)             --  PT/INR and repeat hepatic function panel pending for trending - currently refuses repeat hepatitis panel                A1c 5.9             -- Patient declines restart of Metformin and wishes to continue diet control of pre-diabetes               H/o HTN             -- Currently off medications - will monitor               Admission labs reviewed: UDS positive for amphetamines; CMP WNL other than AST 77, ALT 49 and total bili 1.3; ETOH <10, Salicylate <7, Tylenol <10, CBC WNL, respiratory panel negative, EKG 9/10 shows NSR with abnormal R wave progression early transition with QTC 01/26/21. Repeat EKG shows NSR 60bpm with QTC   4. Discharge Planning:              -- Social work and case management to assist with discharge planning and identification of hospital follow-up needs prior to discharge              -- Estimated LOS: 5-7 days             -- Discharge Concerns: Need to establish a safety plan; Medication compliance and effectiveness             -- Discharge Goals: Return home with outpatient referrals for mental health follow-up including medication management/psychotherapy     I certify that inpatient services furnished can reasonably be expected to improve the patient's condition.    , MD, FAPA 12/11/2021, 7:21 AM

## 2021-12-11 NOTE — Progress Notes (Signed)
Psychoeducational and Goals Group:     Purpose of Group: The groups focus is to identify and establish SMART goal for the shift. Group members participated in discussion about triggers for depression and anxiety. Participants were encouraged to share what they have learned and to actively listen to others.   Patient's Goal: Pt did not attend group.   

## 2021-12-11 NOTE — BH IP Treatment Plan (Signed)
Interdisciplinary Treatment and Diagnostic Plan Update  12/11/2021 Time of Session: 9:30am Willie Herman MRN: [id number]  Principal Diagnosis: MDD (major depressive disorder), recurrent severe, without psychosis ([place])  Secondary Diagnoses: Principal Problem:   MDD (major depressive disorder), recurrent severe, without psychosis ([place]) Active Problems:   Stimulant abuse ([place])   Current Medications:  Current Facility-Administered Medications  Medication Dose Route Frequency Provider Last Rate Last Admin   acetaminophen (TYLENOL) tablet 500 mg  500 mg Oral Q6H PRN Nelda Marseille, Amy E, MD       alum & mag hydroxide-simeth (MAALOX/MYLANTA) 200-200-20 MG/5ML suspension 30 mL  30 mL Oral Q4H PRN Leevy-Johnson, Brooke A, NP       ARIPiprazole (ABILIFY) tablet 5 mg  5 mg Oral Daily Nelda Marseille, Amy E, MD   5 mg at 12/11/21 0746   dicyclomine (BENTYL) tablet 20 mg  20 mg Oral Q6H PRN Harlow Asa, MD       escitalopram (LEXAPRO) tablet 10 mg  10 mg Oral Daily Nelda Marseille, Amy E, MD   10 mg at 12/11/21 0746   hydrOXYzine (ATARAX) tablet 25 mg  25 mg Oral Q6H PRN Ajibola, Ene A, NP   25 mg at 12/11/21 0750   loperamide (IMODIUM) capsule 2-4 mg  2-4 mg Oral PRN Harlow Asa, MD       magnesium hydroxide (MILK OF MAGNESIA) suspension 30 mL  30 mL Oral Daily PRN Leevy-Johnson, Brooke A, NP       methocarbamol (ROBAXIN) tablet 500 mg  500 mg Oral Q8H PRN Nelda Marseille, Amy E, MD       naproxen (NAPROSYN) tablet 500 mg  500 mg Oral BID PRN Nelda Marseille, Amy E, MD       nicotine (NICODERM CQ - dosed in mg/24 hours) patch 21 mg  21 mg Transdermal Daily Nelda Marseille, Amy E, MD   21 mg at 12/11/21 0747   ondansetron (ZOFRAN-ODT) disintegrating tablet 4 mg  4 mg Oral Q6H PRN Harlow Asa, MD       pantoprazole (PROTONIX) EC tablet 20 mg  20 mg Oral Daily Nelda Marseille, Amy E, MD   20 mg at 12/11/21 0746   traZODone (DESYREL) tablet 50 mg  50 mg Oral QHS PRN,MR X 1 Ajibola, Ene A, NP       PTA Medications: Medications  Prior to Admission  Medication Sig Dispense Refill Last Dose   Aspirin-Salicylamide-Caffeine (BC HEADACHE POWDER PO) Take 1 packet by mouth in the morning, at noon, in the evening, and at bedtime. For body aches and pain   12/09/2021    Patient Stressors: Financial difficulties   Health problems   Occupational concerns   Substance abuse    Patient Strengths: Motivation for treatment/growth  Supportive family/friends   Treatment Modalities: Medication Management, Group therapy, Case management,  1 to 1 session with clinician, Psychoeducation, Recreational therapy.   Physician Treatment Plan for Primary Diagnosis: MDD (major depressive disorder), recurrent severe, without psychosis ([place]) Long Term Goal(s): Improvement in symptoms so as ready for discharge   Short Term Goals: Ability to identify triggers associated with substance abuse/mental health issues will improve Ability to verbalize feelings will improve Ability to disclose and discuss suicidal ideas Ability to demonstrate self-control will improve Ability to identify and develop effective coping behaviors will improve  Medication Management: Evaluate patient's response, side effects, and tolerance of medication regimen.  Therapeutic Interventions: 1 to 1 sessions, Unit Group sessions and Medication administration.  Evaluation of Outcomes: Not Met  Physician Treatment Plan for  Secondary Diagnosis: Principal Problem:   MDD (major depressive disorder), recurrent severe, without psychosis ([place]) Active Problems:   Stimulant abuse ([place])  Long Term Goal(s): Improvement in symptoms so as ready for discharge   Short Term Goals: Ability to identify triggers associated with substance abuse/mental health issues will improve Ability to verbalize feelings will improve Ability to disclose and discuss suicidal ideas Ability to demonstrate self-control will improve Ability to identify and develop effective coping behaviors will improve      Medication Management: Evaluate patient's response, side effects, and tolerance of medication regimen.  Therapeutic Interventions: 1 to 1 sessions, Unit Group sessions and Medication administration.  Evaluation of Outcomes: Not Met   RN Treatment Plan for Primary Diagnosis: MDD (major depressive disorder), recurrent severe, without psychosis ([place]) Long Term Goal(s): Knowledge of disease and therapeutic regimen to maintain health will improve  Short Term Goals: Ability to remain free from injury will improve, Ability to verbalize frustration and anger appropriately will improve, Ability to demonstrate self-control, Ability to participate in decision making will improve, Ability to verbalize feelings will improve, Ability to disclose and discuss suicidal ideas, Ability to identify and develop effective coping behaviors will improve, and Compliance with prescribed medications will improve  Medication Management: RN will administer medications as ordered by provider, will assess and evaluate patient's response and provide education to patient for prescribed medication. RN will report any adverse and/or side effects to prescribing provider.  Therapeutic Interventions: 1 on 1 counseling sessions, Psychoeducation, Medication administration, Evaluate responses to treatment, Monitor vital signs and CBGs as ordered, Perform/monitor CIWA, COWS, AIMS and Fall Risk screenings as ordered, Perform wound care treatments as ordered.  Evaluation of Outcomes: Not Met   LCSW Treatment Plan for Primary Diagnosis: MDD (major depressive disorder), recurrent severe, without psychosis ([place]) Long Term Goal(s): Safe transition to appropriate next level of care at discharge, Engage patient in therapeutic group addressing interpersonal concerns.  Short Term Goals: Engage patient in aftercare planning with referrals and resources, Increase social support, Increase ability to appropriately verbalize feelings, Increase  emotional regulation, Facilitate acceptance of mental health diagnosis and concerns, Facilitate patient progression through stages of change regarding substance use diagnoses and concerns, Identify triggers associated with mental health/substance abuse issues, and Increase skills for wellness and recovery  Therapeutic Interventions: Assess for all discharge needs, 1 to 1 time with Social worker, Explore available resources and support systems, Assess for adequacy in community support network, Educate family and significant other(s) on suicide prevention, Complete Psychosocial Assessment, Interpersonal group therapy.  Evaluation of Outcomes: Not Met   Progress in Treatment: Attending groups: No. Participating in groups: No. Taking medication as prescribed: Yes. Toleration medication: Yes. Family/Significant other contact made: No, will contact:  Lowella Petties [phone] Patient understands diagnosis: No. Discussing patient identified problems/goals with staff: No. Medical problems stabilized or resolved: Yes. Denies suicidal/homicidal ideation: Yes. Issues/concerns per patient self-inventory: No.  New problem(s) identified: No, Describe:  none reported   New Short Term/Long Term Goal(s):   medication stabilization, elimination of SI thoughts, development of comprehensive mental wellness plan.    Patient Goals:  Patient refused to offer a goal and refused to participate in meeting. Patient attended but did not offer input.   Discharge Plan or Barriers: Patient recently admitted. CSW will continue to follow and assess for appropriate referrals and possible discharge planning.    Reason for Continuation of Hospitalization: Anxiety Depression Medication stabilization Suicidal ideation Withdrawal symptoms  Estimated Length of Stay: 3-7 days  Last [place]  Suicide Severity Risk Score: Flowsheet Row Admission (Current) from 12/09/2021 in [facility]  300B Most recent reading at 12/09/2021  7:33 PM ED from 12/09/2021 in [facility] Most recent reading at 12/09/2021  1:10 PM ED from 12/08/2021 in [facility] Most recent reading at 12/08/2021 12:33 AM  C-SSRS RISK CATEGORY High Risk High Risk Low Risk       Last PHQ 2/9 Scores:    12/09/2021    1:10 PM 11/26/2020    4:52 PM 05/29/2020    3:16 PM  Depression screen PHQ 2/9  Decreased Interest '2 2 3  ' Down, Depressed, Hopeless '2 2 3  ' PHQ - 2 Score '4 4 6  ' Altered sleeping 2 0 3  Tired, decreased energy '2 1 3  ' Change in appetite 2 0 1  Feeling bad or failure about yourself  '2 2 1  ' Trouble concentrating '2 1 3  ' Moving slowly or fidgety/restless 2 0 2  Suicidal thoughts '3 2 2  ' PHQ-9 Score '19 10 21  ' Difficult doing work/chores Very difficult Somewhat difficult Somewhat difficult    Scribe for Treatment Team: Zachery Conch, LCSW 12/11/2021 9:50 AM

## 2021-12-11 NOTE — Group Note (Signed)
LCSW Group Therapy Note   Group Date: 12/11/2021 Start Time: 1300 End Time: 1400  Type of Therapy and Topic: Group Therapy: Overcoming Obstacles  Participation Level: Did Not Attend  Description of Group: In this group patients will be encouraged to explore what they see as obstacles to their own wellness and recovery. They will be guided to discuss their thoughts, feelings, and behaviors related to these obstacles. The group will process together ways to cope with barriers, with attention given to specific choices patients can make. Each patient will be challenged to identify changes they are motivated to make in order to overcome their obstacles. This group will be process-oriented, with patients participating in exploration of their own experiences as well as giving and receiving support and challenge from other group members.  Therapeutic Goals: 1. Patient will identify personal and current obstacles as they relate to admission. 2. Patient will identify barriers that currently interfere with their wellness or overcoming obstacles. 3. Patient will identify feelings, thought process and behaviors related to these barriers. 4. Patient will identify two changes they are willing to make to overcome these obstacles:  Summary of Patient Progress:  Did not attend   Darleen Crocker, [place] 12/11/2021  2:28 PM

## 2021-12-11 NOTE — [facility] Group Notes (Signed)
Spiritual care group on grief and loss facilitated by chaplain Katy Markea Ruzich, [facility]   Group Goal:   Support / Education around grief and loss   Members engage in facilitated group support and psycho-social education.   Group Description:   Following introductions and group rules, group members engaged in facilitated group dialog and support around topic of loss, with particular support around experiences of loss in their lives. Group Identified types of loss (relationships / self / things) and identified patterns, circumstances, and changes that precipitate losses. Reflected on thoughts / feelings around loss, normalized grief responses, and recognized variety in grief experience. Group noted Worden's four tasks of grief in discussion.   Group drew on Adlerian / Rogerian, narrative, MI,   Patient Progress: Did not attend.  

## 2021-12-11 NOTE — Progress Notes (Addendum)
Pt presented to medication window this morning in a depressed mood with slow motor activity. Pt stated; "I did not sleep well last night because all of the air fresheners they spray in here gives me a headache". Pt endorses passive SI and verbally contracts for safety. Pt rates their anxiety and depression a 7/10. Pt given PRN hydroxyzine for anxiety relief. RN provided support and encouragement to patient. Pt given scheduled medications as prescribed. Q15 min checks verified for safety. RN will continue to monitor pt's progress and provide assistance as indicated. Pt is safe on the unit. Will continue to monitor.   12/11/21 0750  Psych Admission Type (Psych Patients Only)  Admission Status Voluntary  Psychosocial Assessment  Patient Complaints Anxiety;Depression;Sleep disturbance  Eye Contact Brief  Facial Expression Flat  Affect Appropriate to circumstance  Speech Logical/coherent  Interaction Minimal  Motor Activity Slow  Appearance/Hygiene Unremarkable  Behavior Characteristics Appropriate to situation  Mood Depressed  Thought Process  Coherency WDL  Content WDL  Delusions None reported or observed  Perception WDL  Hallucination None reported or observed  Judgment Poor  Confusion None  Danger to Self  Current suicidal ideation? Passive  Self-Injurious Behavior Some self-injurious ideation observed or expressed.  No lethal plan expressed   Agreement Not to Harm Self Yes  Description of Agreement  (verbal contract)  Danger to Others  Danger to Others None reported or observed

## 2021-12-11 NOTE — Progress Notes (Signed)
Psychoeducational Group Note  Date:  12/11/2021 Time:  2101  Group Topic/Focus:  Relapse Prevention Planning:   The focus of this group is to define relapse and discuss the need for planning to combat relapse.  Participation Level: Did Not Attend  Participation Quality:  Not Applicable  Affect:  Not Applicable  Cognitive:  Not Applicable  Insight:  Not Applicable  Engagement in Group: Not Applicable  Additional Comments:  The patient did not attend group this evening.   Archie Balboa S 12/11/2021, 9:01 PM

## 2021-12-12 DIAGNOSIS — F332 Major depressive disorder, recurrent severe without psychotic features: Secondary | ICD-10-CM | POA: Diagnosis not present

## 2021-12-12 MED ORDER — ARIPIPRAZOLE 15 MG PO TABS
15.0000 mg | ORAL_TABLET | Freq: Every day | ORAL | Status: DC
  Administered 2021-12-13 – 2021-12-14 (×2): 15 mg via ORAL
  Filled 2021-12-12 (×3): qty 1
  Filled 2021-12-12: qty 7

## 2021-12-12 MED ORDER — DOCUSATE SODIUM 100 MG PO CAPS: 100.0000 mg | ORAL_CAPSULE | Freq: Every day | ORAL | Status: DC | PRN

## 2021-12-12 NOTE — Group Note (Signed)
Recreation Therapy Group Note   Group Topic:Animal Assisted Therapy   Group Date: 12/12/2021 Start Time: 2353 End Time: 1500 Facilitators: Victorino Sparrow, LRT,CTRS Location: [address] Dayroom   Animal-Assisted Activity (AAA) Program Checklist/Progress Notes Patient Eligibility Criteria Checklist & Daily Group note for Rec Tx Intervention  AAA/T Program Assumption of Risk Form signed by Patient/ or Parent Legal Guardian Yes  Patient understands his/her participation is voluntary Yes   Affect/Mood: N/A   Participation Level: Did not attend    Clinical Observations/Individualized Feedback:    Plan: Continue to engage patient in RT group sessions 2-3x/week.   Victorino Sparrow, LRT,CTRS 12/12/2021 3:26 PM

## 2021-12-12 NOTE — Progress Notes (Addendum)
[facility] MD Progress Note  12/12/2021 7:20 AM Willie Herman  MRN:  [id number]  Chief Complaint: suicidal ideation  Reason for Admission:  Willie Herman is a 46 y.o. male with a history of MDD, GAD, methamphetamine abuse, and previous suicide attempts, who was initially admitted for inpatient psychiatric hospitalization on 12/09/2021 for management of SI and HI. The patient is currently on Hospital Day 3.   Chart Review from last 24 hours:  The patient's chart was reviewed and nursing notes were reviewed. The patient's case was discussed in multidisciplinary team meeting. Per nursing, patient still not attending groups.  Patient received all scheduled medications.  Patient received the following PRN medications: hydroxyzine   Information Obtained Today During Patient Interview: Patient continues to endorse feeling depressed. He also continues to endorse active suicidal ideation but with no specific plan, stating, "it comes to mind, but I try to stop thinking about it." He states he slept well, although mention that he woke up frequently as he is a light sleeper.  He continues to harbor resentment towards his girlfriend and says he has not spoken to her since being admitted.  He also continues to ruminate about the drug dealer who slept with his girlfriend, but tells me he tries not to think about it.  When asked what changed about his feelings to hurt the drug dealer since he was admitted, he states "he is not worth it."  Patient denies AVH. We discussed plans to further titrate up on his Abilify to help with residual irritability and mood stabilization. He was encouraged to attend groups and participate in unit activities. Patient expressed interest in going to [facility] in [place] for future discharge planning. At this time, patient is not interested in going to rehab and states "I can quit on my own."  Patient endorses mild constipation, and he was recommended to ask for his PRN  magnesium hydroxide. He otherwise denies other somatic complaints.  Principal Problem: MDD (major depressive disorder), recurrent severe, without psychosis (HCC) Diagnosis: Principal Problem:   MDD (major depressive disorder), recurrent severe, without psychosis (HCC) Active Problems:   Stimulant abuse (HCC)  Total Time Spent in Direct Patient Care:  I personally spent 30 minutes on the unit in direct patient care. The direct patient care time included face-to-face time with the patient, reviewing the patient's chart, communicating with other professionals, and coordinating care. Greater than 50% of this time was spent in counseling or coordinating care with the patient regarding goals of hospitalization, psycho-education, and discharge planning needs.  Past Psychiatric History:  Previous Psychiatric Diagnoses: stimulant use d/o, MDD, substance induced mood d/o, GAD Current / Past Mental Health Providers: Unclear who had been prescribing meds - denies having current therapist/psychiatrist Previous Psychological Evaluations: Yes  Past Psychiatric Hospitalizations: [facility] 7/22; [facility] 7/22[facility] 3/22; [facility] 2/22 twice per EHR - patient does not recall all previous admissions Past Suicide Attempts: OD on Trazodone 7/22 per EHR Past History of Homicidal Behaviors / Violent or Aggressive Behaviors: Denied History of Self-Mutilation: Denied Previous Participation in PHP/IOP or Residential Programs: Denied Past History of ECT / [employer] / VNS: Denied Past Psychotropic Medication Trials: Lexapro, Abilify, Trazodone, Neurontin, Celexa, Prozac, Xanax, Wellbutrin, Viibryd, Ativan, Ambien per EHR   Past Medical History:  Past Medical History:  Diagnosis Date   Anxiety    COPD (chronic obstructive pulmonary disease) (HCC)    Depression    diet controlled diabetes    former   GERD (gastroesophageal  reflux disease)    High cholesterol    Hypertension    Suicidal ideation     Past Surgical  History:  Procedure Laterality Date   ABDOMINAL SURGERY     APPENDECTOMY     LEG SURGERY     when 12. Patient reports he was in tractor accident and leg had to be repaired   MENISCUS REPAIR     VASCULAR SURGERY     Family History: PGM and father with DM; father, PGF and paternal uncle with heart disease   Family Psychiatric  History: Father had unknown mental health issues, addiction to "everything" and attempted suicide; denies completed suicides in the family    Social History:  Social History   Substance and Sexual Activity  Alcohol Use Not Currently     Social History   Substance and Sexual Activity  Drug Use Yes   Types: Methamphetamines   Comment: last used 4 days ago    Social History   Socioeconomic History   Marital status: Divorced    Spouse name: Not on file   Number of children: Not on file   Years of education: Not on file   Highest education level: Not on file  Occupational History   Not on file  Tobacco Use   Smoking status: Every Day    Packs/day: 1.50    Types: Cigarettes   Smokeless tobacco: Never  Vaping Use   Vaping Use: Never used  Substance and Sexual Activity   Alcohol use: Not Currently   Drug use: Yes    Types: Methamphetamines    Comment: last used 4 days ago   Sexual activity: Not Currently  Other Topics Concern   Not on file  Social History Narrative   Not on file   Social Determinants of Health   Financial Resource Strain: Not on file  Food Insecurity: Unknown (12/09/2021)   Hunger Vital Sign    Worried About Running Out of Food in the Last Year: Patient refused    Ran Out of Food in the Last Year: Patient refused  Transportation Needs: Unknown (12/09/2021)   PRAPARE - Administrator, Civil Service (Medical): Patient refused    Lack of Transportation (Non-Medical): Patient refused  Physical Activity: Not on file  Stress: Not on file  Social Connections: Not on file    Sleep: Fair per patient  report  Appetite:  Fair  Current Medications: Current Facility-Administered Medications  Medication Dose Route Frequency Provider Last Rate Last Admin   acetaminophen (TYLENOL) tablet 500 mg  500 mg Oral Q6H PRN Comer Locket, MD       alum & mag hydroxide-simeth (MAALOX/MYLANTA) 200-200-20 MG/5ML suspension 30 mL  30 mL Oral Q4H PRN Leevy-Johnson, Brooke A, NP       ARIPiprazole (ABILIFY) tablet 10 mg  10 mg Oral Daily Tanina Barb E, MD       dicyclomine (BENTYL) tablet 20 mg  20 mg Oral Q6H PRN Mason Jim, Rorik Vespa E, MD       escitalopram (LEXAPRO) tablet 10 mg  10 mg Oral Daily Mason Jim, Lanette Ell E, MD   10 mg at 12/11/21 0746   hydrOXYzine (ATARAX) tablet 25 mg  25 mg Oral Q6H PRN Ajibola, Ene A, NP   25 mg at 12/11/21 0750   loperamide (IMODIUM) capsule 2-4 mg  2-4 mg Oral PRN Mason Jim, Massie Cogliano E, MD       magnesium hydroxide (MILK OF MAGNESIA) suspension 30 mL  30 mL Oral Daily PRN  Leevy-Johnson, Brooke A, NP       methocarbamol (ROBAXIN) tablet 500 mg  500 mg Oral Q8H PRN Nelda Marseille, Zanobia Griebel E, MD       naproxen (NAPROSYN) tablet 500 mg  500 mg Oral BID PRN Harlow Asa, MD       nicotine (NICODERM CQ - dosed in mg/24 hours) patch 21 mg  21 mg Transdermal Daily Nelda Marseille, Cozette Braggs E, MD   21 mg at 12/11/21 0747   ondansetron (ZOFRAN-ODT) disintegrating tablet 4 mg  4 mg Oral Q6H PRN Harlow Asa, MD       pantoprazole (PROTONIX) EC tablet 20 mg  20 mg Oral Daily Nelda Marseille, Haeley Fordham E, MD   20 mg at 12/11/21 0746   traZODone (DESYREL) tablet 50 mg  50 mg Oral QHS PRN,MR X 1 Ajibola, Ene A, NP        Lab Results:  Results for orders placed or performed during the hospital encounter of 12/09/21 (from the past 48 hour(s))  Lipid panel     Status: Abnormal   Collection Time: 12/11/21  6:36 PM  Result Value Ref Range   Cholesterol 172 0 - 200 mg/dL   Triglycerides 136 <150 mg/dL   HDL 32 (L) >40 mg/dL   Total CHOL/HDL Ratio 5.4 RATIO   VLDL 27 0 - 40 mg/dL   LDL Cholesterol 113 (H) 0 - 99 mg/dL     Comment:        Total Cholesterol/HDL:CHD Risk Coronary Heart Disease Risk Table                     Men   Women  1/2 Average Risk   3.4   3.3  Average Risk       5.0   4.4  2 X Average Risk   9.6   7.1  3 X Average Risk  23.4   11.0        Use the calculated Patient Ratio above and the CHD Risk Table to determine the patient's CHD Risk.        ATP III CLASSIFICATION (LDL):  <100     mg/dL   Optimal  100-129  mg/dL   Near or Above                    Optimal  130-159  mg/dL   Borderline  160-189  mg/dL   High  >190     mg/dL   Very High Performed at [facility] [address], [place], [place] [address]   Protime-INR     Status: None   Collection Time: 12/11/21  6:36 PM  Result Value Ref Range   Prothrombin Time 12.1 11.4 - 15.2 seconds   INR 0.9 0.8 - 1.2    Comment: (NOTE) INR goal varies based on device and disease states. Performed at [facility], [place] [address], [place], [place] [address]   TSH     Status: None   Collection Time: 12/11/21  6:36 PM  Result Value Ref Range   TSH 2.318 0.350 - 4.500 uIU/mL    Comment: Performed by a 3rd Generation assay with a functional sensitivity of <=0.01 uIU/mL. Performed at [facility], [place] [address], [place], [place] [address]   Hepatic function panel     Status: None   Collection Time: 12/11/21  6:36 PM  Result Value Ref Range   Total Protein 6.8 6.5 - 8.1 g/dL   Albumin  4.0 3.5 - 5.0 g/dL   AST 22 15 - 41 U/L   ALT 31 0 - 44 U/L   Alkaline Phosphatase 51 38 - 126 U/L   Total Bilirubin 0.7 0.3 - 1.2 mg/dL   Bilirubin, Direct 0.1 0.0 - 0.2 mg/dL   Indirect Bilirubin 0.6 0.3 - 0.9 mg/dL    Comment: Performed at [facility], [address], [place], [place] [address]    Blood Alcohol level:  Lab Results  Component Value Date   ETH <10 12/08/2021   ETH <10 12/03/2021    Metabolic Disorder Labs: Lab Results  Component Value Date    HGBA1C 5.9 (H) 11/16/2021   MPG 122.63 11/16/2021   MPG 128 08/20/2020   No results found for: "PROLACTIN" Lab Results  Component Value Date   CHOL 172 12/11/2021   TRIG 136 12/11/2021   HDL 32 (L) 12/11/2021   CHOLHDL 5.4 12/11/2021   VLDL 27 12/11/2021   LDLCALC 113 (H) 12/11/2021   LDLCALC 115 (H) 05/20/2020    Physical Findings:  Musculoskeletal: Strength & Muscle Tone: within normal limits Gait & Station: normal Patient leans: N/A  Mental Status Exam:  Appearance and Grooming: Patient is casually dressed in a t-shirt . The patient has no noticeable scent or odor.  Behavior: The patient appears in no acute distress, and during the interview, was calm, focused, required minimal redirection, and behaving appropriately to scenario; he was able to follow commands and compliant to requests and made good eye contact.  Attitude: Patient was cooperative, open, and slightly irritable  during the interview.  Motor activity: his gait was not observed during encounter as patient was laying in bed. There was no notable abnormal facial movements and no notable abnormal extremity movements.  Speech: The patient's speech was clear, fluent, with good articulation, and with appropriately placed inflections. The volume of his speech was normal and normal in quantity. The rate was normal with a normal rhythm. Responses were normal in latency. There were no abnormal patterns in speech.  Mood: "Fine"  Affect: Patient's affect is dysphoric and blunted with restricted range and even fluctuations; his affect is incongruent, as evidenced by his blunted affect, with his stated mood. -------------------------------------------------------------------------------------------------------------------------  Thought Content The patient experiences no hallucinations. The patient describes no delusional thoughts; he denies thought insertion, denies thought withdrawal, denies thought interruption,  and denies thought broadcasting.  Patient  endorses active suicidal ideation but no specific plan ; he denies homicidal intent. He contracts for safety on the unit.  Thought Process The patient's thought process is linear and goal-directed.  Insight The patient demonstrates good insight, as evidenced by an ability to recognize his mental health condition as well as identifying his triggers.  Judgement The patient demonstrates fair judgement, as evidenced by help-seeking behavior, such as willing admitting himself to the hospital for SI and HI, adhering to medication regimen, and refusing to participate in group therapy.    Physical Exam Vitals and nursing note reviewed.  Constitutional:      Appearance: Normal appearance.  HENT:     Head: Normocephalic.  Pulmonary:     Effort: Pulmonary effort is normal.  Neurological:     General: No focal deficit present.     Mental Status: He is alert.    Review of Systems  Respiratory:  Negative for shortness of breath.   Cardiovascular:  Negative for chest pain.  Gastrointestinal:  Negative for constipation, diarrhea, nausea and vomiting.   Blood pressure Marland Kitchen)  116/102, pulse 89, temperature 98.6 F (37 C), temperature source Oral, resp. rate 20, height  (1.803 m), weight 90 kg, SpO2 97 %. Body mass index is 27.67 kg/m.   Treatment Plan Summary: Diagnoses / Active Problems: MDD recurrent severe without psychotic features (r/o SIMD) Methamphetamine abuse - r/o stimulant use d/o by hx GAD by hx   PLAN: Safety and Monitoring:             -- Voluntary admission to inpatient psychiatric unit for safety, stabilization and treatment             -- Daily contact with patient to assess and evaluate symptoms and progress in treatment             -- Patient's case to be discussed in multi-disciplinary team meeting             -- Observation Level : q15 minute checks             -- Vital signs:  q12 hours             -- Precautions:  suicide, elopement, and assault   2. Psychiatric Diagnoses and Treatment:              MDD recurrent severe without psychotic features (r/o SIMD) GAD by hx -- Continue Lexapro  daily  -- Increase Abilify from 10 mg to 15 mg daily for residual irritability and mood stabilization QTC ; lipid panel shows HDL 32, LDL 113; A1c 5.9 11/16/21 Will repeat EKG tomorrow for QTC monitoring while on Lexapro and Abilify -- Continue Trazodone  qhs PRN insomnia -- Continue Vistaril  q6 hours PRN anxiety -- TSH 2.318             -- Encouraged patient to participate in unit milieu and in scheduled group therapies   -- SW to assist with shelter referral at discharge                         Methamphetamine abuse - r/o stimulant use d/o by hx             -- UDS positive for amphetamines - will have PRNS available for potential withdrawal             -- Discussed residential rehab/SAIOP and he is not interested - discussed option of [facility], [facility], and [facility] and he declines             -- Need for abstinence from illicit substances encouraged             -- Declines repeat HIV or hepatitis testing (HIV negative 11/17/21; Hepatitis panel negative 05/18/20)             -- Continue Protonix  daily for GI prophylaxis                           3. Medical Issues Being Addressed:              Tobacco Use Disorder             -- Nicotine patch /24 hours ordered             -- Smoking cessation encouraged               Elevated AST and ALT and elevated total bili on admission - resolved -- repeat LFTs, PT/INR all WNL on repeat labs (9/18)  A1c 5.9             -- Patient declines restart of Metformin and wishes to continue diet control of pre-diabetes               H/o HTN             -- Currently off medications - will monitor               Admission labs reviewed: UDS positive for amphetamines; CMP WNL other than AST 77, ALT 49 and total bili 1.3; ETOH <10, Salicylate  <7, Tylenol <10, CBC WNL, respiratory panel negative, EKG 9/10 shows NSR with abnormal R wave progression early transition with QTC 434ms. Repeat EKG shows NSR 60bpm with QTC 426ms   4. Discharge Planning:              -- Social work and case management to assist with discharge planning and identification of hospital follow-up needs prior to discharge             -- Estimated LOS: 5-7 days             -- Discharge Concerns: Need to establish a safety plan; Medication compliance and effectiveness             -- Discharge Goals: Return home with outpatient referrals for mental health follow-up including medication management/psychotherapy     I certify that inpatient services furnished can reasonably be expected to improve the patient's condition.    Augusto Gambleaniel Tan, MD 12/12/2021, 7:20 AM

## 2021-12-12 NOTE — Progress Notes (Signed)
Pt reported passive SI this morning and verbally contracted for safety. Pt rates their anxiety and depression a 10/10. PRN Atarax given to pt to relieve anxiety. Pt is calm and cooperative. RN offered pt stool softener for constipation per orders, pt refused. RN advised pt to ambulate and drink fluids to encourage bowel movement. RN provided support and encouragement to pt. Pt given scheduled medications as prescribed. Q15 min checks verified for safety. RN will continue to monitor pt's progress and provide assistance as indicated. Pt is safe on the unit. Will continue to monitor    12/12/21 0936  Psych Admission Type (Psych Patients Only)  Admission Status Voluntary  Psychosocial Assessment  Patient Complaints Anxiety;Depression;Self-harm thoughts  Eye Contact Brief  Facial Expression Flat  Affect Anxious  Speech Logical/coherent  Interaction Minimal  Motor Activity Slow  Appearance/Hygiene Unremarkable  Behavior Characteristics Appropriate to situation  Mood Anxious;Depressed  Thought Process  Coherency WDL  Content WDL  Delusions None reported or observed  Perception WDL  Hallucination None reported or observed  Judgment Impaired  Confusion None  Danger to Self  Current suicidal ideation? Passive  Self-Injurious Behavior Some self-injurious ideation observed or expressed.  No lethal plan expressed   Agreement Not to Harm Self Yes  Description of Agreement Verbal Contract  Danger to Others  Danger to Others None reported or observed

## 2021-12-12 NOTE — Progress Notes (Signed)
Pt did not attend wrap up group this evening.  

## 2021-12-13 DIAGNOSIS — F332 Major depressive disorder, recurrent severe without psychotic features: Secondary | ICD-10-CM

## 2021-12-13 MED ORDER — ESCITALOPRAM OXALATE 10 MG PO TABS: 10.0000 mg | ORAL_TABLET | Freq: Every day | ORAL | 0 refills | Status: AC

## 2021-12-13 MED ORDER — NICOTINE 21 MG/24HR TD PT24: 21.0000 mg | MEDICATED_PATCH | Freq: Every day | TRANSDERMAL | 0 refills | Status: AC

## 2021-12-13 MED ORDER — PANTOPRAZOLE SODIUM 20 MG PO TBEC: 20.0000 mg | DELAYED_RELEASE_TABLET | Freq: Every day | ORAL | 0 refills | Status: AC

## 2021-12-13 MED ORDER — NICOTINE POLACRILEX 2 MG MT GUM: 2.0000 mg | CHEWING_GUM | Freq: Four times a day (QID) | OROMUCOSAL | Status: DC | PRN

## 2021-12-13 MED ORDER — ARIPIPRAZOLE 15 MG PO TABS: 15.0000 mg | ORAL_TABLET | Freq: Every day | ORAL | 0 refills | Status: AC

## 2021-12-13 MED ORDER — HYDROXYZINE HCL 25 MG PO TABS: 25.0000 mg | ORAL_TABLET | Freq: Four times a day (QID) | ORAL | 0 refills | Status: AC | PRN

## 2021-12-13 NOTE — Progress Notes (Addendum)
Pt denied SI/HI and A/VH-  Pt observed attending groups throughout the shift and going down for meals. Pt remains safe with Q 15 min checks.    12/13/21 0900  Psych Admission Type (Psych Patients Only)  Admission Status Voluntary  Psychosocial Assessment  Patient Complaints Depression  Eye Contact Brief  Facial Expression Flat  Affect Depressed  Speech Logical/coherent  Interaction Minimal  Motor Activity Slow  Appearance/Hygiene Unremarkable  Behavior Characteristics Cooperative;Guarded  Mood Depressed  Thought Process  Coherency WDL  Content WDL  Delusions None reported or observed  Perception WDL  Hallucination None reported or observed  Judgment Poor  Confusion None  Danger to Self  Current suicidal ideation? Denies  Self-Injurious Behavior No self-injurious ideation or behavior indicators observed or expressed   Agreement Not to Harm Self Yes  Description of Agreement verbal contract  Danger to Others  Danger to Others None reported or observed  Danger to Others Abnormal  Harmful Behavior to others No threats or harm toward other people

## 2021-12-13 NOTE — Progress Notes (Signed)
Adult Psychoeducational Group Note  Date:  12/13/2021 Time:  11:28 AM  Group Topic/Focus:  Goals Group:   The focus of this group is to help patients establish daily goals to achieve during treatment and discuss how the patient can incorporate goal setting into their daily lives to aide in recovery.  Participation Level:  Active  Participation Quality:  Appropriate  Affect:  Appropriate  Cognitive:  Appropriate  Insight: Appropriate  Engagement in Group:  Engaged  Modes of Intervention:  Discussion  Additional Comments:  Patient attended morning orientation/goal setting group and participated.  Mila Pair W Kinston Magnan 06/02/4074, 11:28 AM

## 2021-12-13 NOTE — Group Note (Signed)
LCSW Group Therapy Note  Group Date: 12/13/2021 Start Time: 1300 End Time: 1355   Type of Therapy and Topic:  Group Therapy: Anger Cues and Responses  Participation Level:  Active   Description of Group:   In this group, patients learned how to recognize the physical, cognitive, emotional, and behavioral responses they have to anger-provoking situations.  They identified a recent time they became angry and how they reacted.  They analyzed how their reaction was possibly beneficial and how it was possibly unhelpful.  The group discussed a variety of healthier coping skills that could help with such a situation in the future.  Focus was placed on how helpful it is to recognize the underlying emotions to our anger, because working on those can lead to a more permanent solution as well as our ability to focus on the important rather than the urgent.  Therapeutic Goals: Patients will remember their last incident of anger and how they felt emotionally and physically, what their thoughts were at the time, and how they behaved. Patients will identify how their behavior at that time worked for them, as well as how it worked against them. Patients will explore possible new behaviors to use in future anger situations. Patients will learn that anger itself is normal and cannot be eliminated, and that healthier reactions can assist with resolving conflict rather than worsening situations.  Summary of Patient Progress:  Brewer was active during the group. Marquest shared a recent occurrence wherein feeling "out of control" led to anger. Quindon demonstrated good insight into the subject matter, was respectful of peers, and participated throughout the entire session.  Therapeutic Modalities:   Cognitive Behavioral Therapy    Windle Guard, LCSW 12/13/2021  2:02 PM

## 2021-12-13 NOTE — [facility] Suicide Risk Assessment (Signed)
[place] INPATIENT:  Family/Significant Other Suicide Prevention Education  Suicide Prevention Education:  Contact Attempts: Lowella Petties [phone] (Significant Other) has been identified by the patient as the family member/significant other with whom the patient will be residing, and identified as the person(s) who will aid the patient in the event of a mental health crisis.  With written consent from the patient, two attempts were made to provide suicide prevention education, prior to and/or following the patient's discharge.  We were unsuccessful in providing suicide prevention education.  A suicide education pamphlet was given to the patient to share with family/significant other.  Date and time of first attempt: 12/12/2021 at 2:05pm Date and time of second attempt: 12/13/2021 at 9:57am   Darleen Crocker 12/13/2021, 2:29 PM

## 2021-12-13 NOTE — Progress Notes (Signed)
Preciliano did not attend wrap up group. 

## 2021-12-13 NOTE — Group Note (Signed)
Recreation Therapy Group Note   Group Topic:Stress Management  Group Date: 12/13/2021 Start Time: 0930 End Time: 1000 Facilitators: Victorino Sparrow, LRT,CTRS Location: [address] Day[address]   Goal Area(s) Addresses:  Patient will actively participate in stress management techniques presented during session.  Patient will successfully identify benefit of practicing stress management post d/c.   Group Description: Guided Imagery. LRT provided education, instruction, and demonstration on practice of visualization via guided imagery. Patient was asked to participate in the technique introduced during session. LRT debriefed including topics of mindfulness, stress management and specific scenarios each patient could use these techniques. Patients were given suggestions of ways to access scripts post d/c and encouraged to explore Youtube and other apps available on smartphones, tablets, and computers.  Clinical Observations/Feedback:  LRT was unable to hold group due to miscommunication with MHT about group time.   Plan: Continue to engage patient in RT group sessions 2-3x/week.   Victorino Sparrow, Glennis Brink 12/13/2021 12:28 PM

## 2021-12-13 NOTE — Progress Notes (Addendum)
[facility] MD Progress Note  12/13/2021 10:40 AM Willie Herman  MRN:  [id number]  Chief Complaint: suicidal ideation  Reason for Admission:  Willie Herman is a 46 y.o. male with a history of MDD, GAD, methamphetamine abuse, and previous suicide attempts, who was initially admitted for inpatient psychiatric hospitalization on 12/09/2021 for management of SI and HI. The patient is currently on Hospital Day 4.   Chart Review from last 24 hours:  The patient's chart was reviewed and nursing notes were reviewed. The patient's case was discussed in multidisciplinary team meeting. Per nursing, patient still not attending groups yesterday.  Patient received all scheduled medications.  Patient received the following PRN medications: hydroxyzine yesterday at 751 AM  Information Obtained Today During Patient Interview: Patient states he slept well and is "feeling better" today. He denies any SI/HI and AVH today and reports his anxiety/depression is a 0/10 compared to a 10/10 yesterday. When asked regarding how he feels about situation with girlfriend and drug-dealer, patient states he "just has to let it go." Patient states he still loves his girlfriend and identified her as a trigger for his initial methamphetamine use. Patient states he did not attend groups yesterday because being around other people makes him feel anxious.Today we discussed the room lock-out procedure and how attending groups may help him process the events leading up to his admission. He states he will attend group today and later reported after rounds that he had attended a group and did well with peers.  Patient denies any side effects from his Abilify. He reports sleeping well with no nightmares or intrusive thoughts. He endorses cravings for cigarettes. Patient has nicotine patch and we discussed using nicotine gum to help manage cravings. He otherwise denies other somatic complaints or cravings for illicit substances.  Patient expressed  interest in going to [facility] in [place] for future discharge planning and states he can receive his medications for free with weekly doctor visitations through that clinic for psychiatric care. We discussed that his LFTs have normalized and that he needs a PCP to manage his pre-diabetes, elevated LDL cholesterol, and monitor his BP off medications. The need for ongoing metabolic lab monitoring on his present medication regimen was again reviewed.   Principal Problem: MDD (major depressive disorder), recurrent severe, without psychosis (HCC) Diagnosis: Principal Problem:   MDD (major depressive disorder), recurrent severe, without psychosis (HCC) Active Problems:   Stimulant abuse (HCC)  Total Time Spent in Direct Patient Care:  I personally spent 30 minutes on the unit in direct patient care. The direct patient care time included face-to-face time with the patient, reviewing the patient's chart, communicating with other professionals, and coordinating care. Greater than 50% of this time was spent in counseling or coordinating care with the patient regarding goals of hospitalization, psycho-education, and discharge planning needs.  Past Psychiatric History:  Previous Psychiatric Diagnoses: stimulant use d/o, MDD, substance induced mood d/o, GAD Current / Past Mental Health Providers: Unclear who had been prescribing meds - denies having current therapist/psychiatrist Previous Psychological Evaluations: Yes  Past Psychiatric Hospitalizations: [facility] 7/22; [facility] 7/22[facility] 3/22; [facility] 2/22 twice per EHR - patient does not recall all previous admissions Past Suicide Attempts: OD on Trazodone 7/22 per EHR Past History of Homicidal Behaviors / Violent or Aggressive Behaviors: Denied History of Self-Mutilation: Denied Previous Participation in PHP/IOP or Residential Programs: Denied Past History of ECT / [employer] / VNS: Denied Past Psychotropic Medication Trials:  Lexapro, Abilify, Trazodone,  Neurontin, Celexa, Prozac, Xanax, Wellbutrin, Viibryd, Ativan, Ambien per EHR   Past Medical History:  Past Medical History:  Diagnosis Date   Anxiety    COPD (chronic obstructive pulmonary disease) (HCC)    Depression    diet controlled diabetes    former   GERD (gastroesophageal reflux disease)    High cholesterol    Hypertension    Suicidal ideation     Past Surgical History:  Procedure Laterality Date   ABDOMINAL SURGERY     APPENDECTOMY     LEG SURGERY     when 12. Patient reports he was in tractor accident and leg had to be repaired   MENISCUS REPAIR     VASCULAR SURGERY     Family History: PGM and father with DM; father, PGF and paternal uncle with heart disease   Family Psychiatric  History: Father had unknown mental health issues, addiction to "everything" and attempted suicide; denies completed suicides in the family    Social History:  Social History   Substance and Sexual Activity  Alcohol Use Not Currently     Social History   Substance and Sexual Activity  Drug Use Yes   Types: Methamphetamines   Comment: last used 4 days ago    Social History   Socioeconomic History   Marital status: Divorced    Spouse name: Not on file   Number of children: Not on file   Years of education: Not on file   Highest education level: Not on file  Occupational History   Not on file  Tobacco Use   Smoking status: Every Day    Packs/day: 1.50    Types: Cigarettes   Smokeless tobacco: Never  Vaping Use   Vaping Use: Never used  Substance and Sexual Activity   Alcohol use: Not Currently   Drug use: Yes    Types: Methamphetamines    Comment: last used 4 days ago   Sexual activity: Not Currently  Other Topics Concern   Not on file  Social History Narrative   Not on file   Social Determinants of Health   Financial Resource Strain: Not on file  Food Insecurity: Unknown (12/09/2021)   Hunger Vital Sign    Worried About Running Out  of Food in the Last Year: Patient refused    Ran Out of Food in the Last Year: Patient refused  Transportation Needs: Unknown (12/09/2021)   PRAPARE - Administrator, Civil Service (Medical): Patient refused    Lack of Transportation (Non-Medical): Patient refused  Physical Activity: Not on file  Stress: Not on file  Social Connections: Not on file    Sleep: good per patient report (total time unrecorded)  Appetite:  Good  Current Medications: Current Facility-Administered Medications  Medication Dose Route Frequency Provider Last Rate Last Admin   acetaminophen (TYLENOL) tablet 500 mg  500 mg Oral Q6H PRN Mason Jim, Braxon Suder E, MD       alum & mag hydroxide-simeth (MAALOX/MYLANTA) 200-200-20 MG/5ML suspension 30 mL  30 mL Oral Q4H PRN Leevy-Johnson, Brooke A, NP       ARIPiprazole (ABILIFY) tablet 15 mg  15 mg Oral Daily Augusto Gamble, MD   15 mg at 12/13/21 0757   dicyclomine (BENTYL) tablet 20 mg  20 mg Oral Q6H PRN Mason Jim, Takirah Binford E, MD       docusate sodium (COLACE) capsule 100 mg  100 mg Oral Daily PRN Comer Locket, MD       escitalopram (LEXAPRO) tablet  10 mg  10 mg Oral Daily Comer Locket, MD   10 mg at 12/13/21 0757   hydrOXYzine (ATARAX) tablet 25 mg  25 mg Oral Q6H PRN Ajibola, Ene A, NP   25 mg at 12/12/21 0751   loperamide (IMODIUM) capsule 2-4 mg  2-4 mg Oral PRN Comer Locket, MD       magnesium hydroxide (MILK OF MAGNESIA) suspension 30 mL  30 mL Oral Daily PRN Leevy-Johnson, Brooke A, NP       methocarbamol (ROBAXIN) tablet 500 mg  500 mg Oral Q8H PRN Mason Jim, Delorus Langwell E, MD       naproxen (NAPROSYN) tablet 500 mg  500 mg Oral BID PRN Comer Locket, MD       nicotine (NICODERM CQ - dosed in mg/24 hours) patch 21 mg  21 mg Transdermal Daily Mason Jim, Quinnten Calvin E, MD   21 mg at 12/13/21 8527   nicotine polacrilex (NICORETTE) gum 2 mg  2 mg Oral Q6H PRN Augusto Gamble, MD       ondansetron (ZOFRAN-ODT) disintegrating tablet 4 mg  4 mg Oral Q6H PRN Comer Locket,  MD       pantoprazole (PROTONIX) EC tablet 20 mg  20 mg Oral Daily Mason Jim, Jakarius Flamenco E, MD   20 mg at 12/13/21 0757   traZODone (DESYREL) tablet 50 mg  50 mg Oral QHS PRN,MR X 1 Ajibola, Ene A, NP        Lab Results:  Results for orders placed or performed during the hospital encounter of 12/09/21 (from the past 48 hour(s))  Lipid panel     Status: Abnormal   Collection Time: 12/11/21  6:36 PM  Result Value Ref Range   Cholesterol 172 0 - 200 mg/dL   Triglycerides 782 <423 mg/dL   HDL 32 (L) >53 mg/dL   Total CHOL/HDL Ratio 5.4 RATIO   VLDL 27 0 - 40 mg/dL   LDL Cholesterol 614 (H) 0 - 99 mg/dL    Comment:        Total Cholesterol/HDL:CHD Risk Coronary Heart Disease Risk Table                     Men   Women  1/2 Average Risk   3.4   3.3  Average Risk       5.0   4.4  2 X Average Risk   9.6   7.1  3 X Average Risk  23.4   11.0        Use the calculated Patient Ratio above and the CHD Risk Table to determine the patient's CHD Risk.        ATP III CLASSIFICATION (LDL):  <100     mg/dL   Optimal  431-540  mg/dL   Near or Above                    Optimal  130-159  mg/dL   Borderline  086-761  mg/dL   High  >950     mg/dL   Very High Performed at [facility], [address] [place], [place] [address]   Protime-INR     Status: None   Collection Time: 12/11/21  6:36 PM  Result Value Ref Range   Prothrombin Time 12.1 11.4 - 15.2 seconds   INR 0.9 0.8 - 1.2    Comment: (NOTE) INR goal varies based on device and disease states. Performed at [facility], [address]  [place], [place] [address]   TSH     Status: None   Collection Time: 12/11/21  6:36 PM  Result Value Ref Range   TSH 2.318 0.350 - 4.500 uIU/mL    Comment: Performed by a 3rd Generation assay with a functional sensitivity of <=0.01 uIU/mL. Performed at [facility], [address] [place], [place] [address]   Hepatic function panel     Status: None    Collection Time: 12/11/21  6:36 PM  Result Value Ref Range   Total Protein 6.8 6.5 - 8.1 g/dL   Albumin 4.0 3.5 - 5.0 g/dL   AST 22 15 - 41 U/L   ALT 31 0 - 44 U/L   Alkaline Phosphatase 51 38 - 126 U/L   Total Bilirubin 0.7 0.3 - 1.2 mg/dL   Bilirubin, Direct 0.1 0.0 - 0.2 mg/dL   Indirect Bilirubin 0.6 0.3 - 0.9 mg/dL    Comment: Performed at [facility], [address], [place], [place] [address]    Blood Alcohol level:  Lab Results  Component Value Date   ETH <10 12/08/2021   ETH <10 12/03/2021    Metabolic Disorder Labs: Lab Results  Component Value Date   HGBA1C 5.9 (H) 11/16/2021   MPG 122.63 11/16/2021   MPG 128 08/20/2020   No results found for: "PROLACTIN" Lab Results  Component Value Date   CHOL 172 12/11/2021   TRIG 136 12/11/2021   HDL 32 (L) 12/11/2021   CHOLHDL 5.4 12/11/2021   VLDL 27 12/11/2021   LDLCALC 113 (H) 12/11/2021   LDLCALC 115 (H) 05/20/2020    Physical Findings:  Musculoskeletal: Strength & Muscle Tone: within normal limits Gait & Station: normal Patient leans: N/A  Mental Status Exam:  Appearance and Grooming: Patient is casually dressed in a t-shirt and shorts . The patient has no noticeable scent or odor.  Behavior: The patient appears in no acute distress, and during the interview, was calm, focused, required minimal redirection, and behaving appropriately to scenario; he was able to follow commands and compliant to requests and made good eye contact.  Attitude: Patient was cooperative and open during the interview.  Motor activity: His gait was normal and was observed when he walked back to his bed from the bathroom. There was no notable abnormal facial movements and no notable abnormal extremity movements. No cogwheeling, akathesias, or stiffness on exam, AIMS 0  Speech: The patient's speech was clear, fluent, with good articulation, and with appropriately placed inflections. The volume of his speech  was normal and normal in quantity. The rate was normal with a normal rhythm. Responses were normal in latency. There were no abnormal patterns in speech.  Mood: "Good, better"   Affect: Patient's affect is euthymic and appears brighter - can smile -------------------------------------------------------------------------------------------------------------------------  Thought Content The patient experiences no hallucinations. The patient describes no delusional thoughts; he denies thought insertion, denies thought withdrawal, denies thought interruption, and denies thought broadcasting.  Patient and SI, intent or plan and he denies HI, intent or plan  He contracts for safety on the unit.  Thought Process The patient's thought process is linear and goal-directed.  Insight The patient demonstrates good insight, as evidenced by an ability to recognize his mental health condition as well as identifying his triggers.  Judgement The patient demonstrates fair judgement, as evidenced by help-seeking behavior, such as willingly admitting himself to the hospital for SI and HI, adhering to medication regimen, and actively participating in group therapy.  Physical Exam Vitals and nursing note reviewed.  Constitutional:      Appearance: Normal appearance.  HENT:     Head: Normocephalic.  Pulmonary:     Effort: Pulmonary effort is normal.  Neurological:     General: No focal deficit present.     Mental Status: He is alert.    Review of Systems  Respiratory:  Negative for shortness of breath.   Cardiovascular:  Negative for chest pain.  Gastrointestinal:  Negative for constipation, diarrhea, nausea and vomiting.   Blood pressure (!) 115/94, pulse 90, temperature 98 F (36.7 C), temperature source Oral, resp. rate 18, height 5\' 11"  (1.803 m), weight 90 kg, SpO2 98 %. Body mass index is 27.67 kg/m.   Treatment Plan Summary: Diagnoses / Active Problems: MDD recurrent severe without  psychotic features (r/o SIMD) Methamphetamine abuse - r/o stimulant use d/o by hx GAD by hx   PLAN: Safety and Monitoring:             -- Voluntary admission to inpatient psychiatric unit for safety, stabilization and treatment             -- Daily contact with patient to assess and evaluate symptoms and progress in treatment             -- Patient's case to be discussed in multi-disciplinary team meeting             -- Observation Level : q15 minute checks             -- Vital signs:  q12 hours             -- Precautions: suicide, elopement, and assault   2. Psychiatric Diagnoses and Treatment:              MDD recurrent severe without psychotic features (r/o SIMD) GAD by hx -- Continue Lexapro 10mg  daily  -- Continue Abilify 15 mg daily for mood stabilization QTC 409 ms on 9/20; lipid panel shows HDL 32, LDL 113; A1c 5.9 11/16/21 -- Continue Trazodone 50mg  qhs PRN insomnia -- Continue Vistaril 25mg  q6 hours PRN anxiety -- TSH 2.318             -- Encouraged patient to participate in unit milieu and in scheduled group therapies   -- SW to assist with shelter referral at discharge                         Methamphetamine abuse - r/o stimulant use d/o by hx             -- UDS positive for amphetamines - will have PRNS available for potential withdrawal             -- Discussed residential rehab/SAI[facility] and he is not interested - discussed option of [facility], [facility], and [facility] and he declines             -- Need for abstinence from illicit substances encouraged             -- Declines repeat HIV or hepatitis testing (HIV negative 11/17/21; Hepatitis panel negative 05/18/20)             -- Continue Protonix 20mg  daily for GI prophylaxis                           3. Medical Issues Being Addressed:  Tobacco Use Disorder             -- Nicotine patch 21mg /24 hours ordered             -- Smoking cessation encouraged               Elevated AST and ALT and elevated total bili on  admission - resolved -- repeat LFTs, PT/INR all WNL on repeat labs (9/18)                A1c 5.9             -- Patient declines restart of Metformin and wishes to continue diet control of pre-diabetes               H/o HTN             -- Currently off medications - will monitor               Admission labs reviewed: UDS positive for amphetamines; CMP WNL other than AST 77, ALT 49 and total bili 1.3; ETOH <81, Salicylate <7, Tylenol <10, CBC WNL, respiratory panel negative, EKG 9/10 shows NSR with abnormal R wave progression early transition with QTC 498ms. Repeat EKG shows NSR 60bpm with QTC 437ms on 9/17   4. Discharge Planning:              -- Social work and case management to assist with discharge planning and identification of hospital follow-up needs prior to discharge             -- Estimated LOS: 5-7 days             -- Discharge Concerns: Need to establish a safety plan; Medication compliance and effectiveness             -- Discharge Goals: Return home with outpatient referrals for mental health follow-up including medication management/psychotherapy     I certify that inpatient services furnished can reasonably be expected to improve the patient's condition.    Jerolyn Center, Medical Student 12/13/2021, 10:40 AM Attestation for Student Documentation:  I certify that I saw and interviewed the patient together with the medical student and was present for the duration of the interview.  I reviewed the medical record.  I performed or reperformed the mental status examination of the patient as indicated.  I formulated the assessment and plan of treatment as documented above with edits.  Viann Fish, MD, Alda Ponder

## 2021-12-14 DIAGNOSIS — F332 Major depressive disorder, recurrent severe without psychotic features: Secondary | ICD-10-CM | POA: Diagnosis not present

## 2021-12-14 NOTE — [facility] Suicide Risk Assessment (Signed)
[facility] Discharge Suicide Risk Assessment   Principal Problem: MDD (major depressive disorder), recurrent severe, without psychosis ([place]) Discharge Diagnoses: Principal Problem:   MDD (major depressive disorder), recurrent severe, without psychosis ([place]) Active Problems:   Stimulant abuse ([place])   Reason for Admission: Patient is a 46y/o male with h/o MDD, GAD, methamphetamine abuse, and previous suicide attempts, who presented voluntarily and unaccompanied to [facility] ED for SI and HI with fear he would act on these ideations. According to intake notes, he did not have a specific suicide plan but had plausible methods he was considering including overdosing on street drugs or shooting himself. He had HI toward a drug dealer.   Hospital Summary During the patient's hospitalization, patient had extensive initial psychiatric evaluation, and follow-up psychiatric evaluations every day.  Psychiatric diagnoses provided upon initial assessment:  MDD recurrent severe without psychotic features GAD  Methamphetamine abuse  Tobacco Use Disorder  Patient's psychiatric medications were adjusted on admission:  -- Restart Lexapro 10mg  daily  -- Start Abilify 5mg  daily  -- Start Trazodone 50mg  qhs PRN insomnia -- Start Vistaril 25mg  q6 hours PRN anxiety  During the hospitalization, other adjustments were made to the patient's psychiatric medication regimen:  --Increased Abilify to 15 mg --No further adjustments made  Gradually, patient started adjusting to milieu.   Patient's care was discussed during the interdisciplinary team meeting every day during the hospitalization.  The patient denies having side effects to prescribed psychiatric medication.  The patient reports their target psychiatric symptoms of depression and anxiety responded well to the psychiatric medications, and the patient reports overall benefit other psychiatric hospitalization. Supportive psychotherapy was provided to the  patient. The patient also participated in regular group therapy while admitted.   Labs were reviewed with the patient, and abnormal results were discussed with the patient.  The patient denied having suicidal thoughts more than 48 hours prior to discharge.  Patient denies having homicidal thoughts.  Patient denies having auditory hallucinations.  Patient denies any visual hallucinations.  Patient denies having paranoid thoughts.  The patient is able to verbalize their individual safety plan to this provider.  It is recommended to the patient to continue psychiatric medications as prescribed, after discharge from the hospital.    It is recommended to the patient to follow up with your outpatient psychiatric provider and PCP.  Discussed with the patient, the impact of alcohol, drugs, tobacco have been there overall psychiatric and medical wellbeing, and total abstinence from substance use was recommended the patient.   Total Time spent with patient: 45 minutes  Musculoskeletal: Strength & Muscle Tone: within normal limits Gait & Station: normal Patient leans: N/A  Psychiatric Specialty Exam  Presentation  General Appearance: Appropriate for Environment; Casual   Eye Contact:Good   Speech:Clear and Coherent; Normal Rate   Speech Volume:Normal   Handedness:No data recorded   Mood and Affect  Mood:Euthymic   Duration of Depression Symptoms: Greater than two weeks   Affect:Appropriate; Congruent    Thought Process  Thought Processes:Coherent; Goal Directed; Linear   Descriptions of Associations:Intact   Orientation:Full (Time, Place and Person)   Thought Content:Logical   History of Schizophrenia/Schizoaffective disorder:No   Duration of Psychotic Symptoms:No data recorded  Hallucinations:Hallucinations: None  Ideas of Reference:None   Suicidal Thoughts:Suicidal Thoughts: No  Homicidal Thoughts:Homicidal Thoughts: No   Sensorium   Memory:Immediate Good; Recent Good; Remote Good   Judgment:Good   Insight:Good    Executive Functions  Concentration:Good   Attention Span:Good   Recall:Good  Fund of Knowledge:Good   Language:Good    Psychomotor Activity  Psychomotor Activity:Normal  Assets  Assets:Communication Skills; Desire for Improvement; Housing; Physical Health; Resilience    Sleep  Sleep:Good  Physical Exam: Physical Exam Vitals and nursing note reviewed.  Constitutional:      Appearance: Normal appearance. He is normal weight.  HENT:     Head: Normocephalic and atraumatic.  Pulmonary:     Effort: Pulmonary effort is normal.  Neurological:     General: No focal deficit present.     Mental Status: He is oriented to person, place, and time.    Review of Systems  Respiratory:  Negative for shortness of breath.   Cardiovascular:  Negative for chest pain.  Gastrointestinal:  Negative for abdominal pain, constipation, diarrhea, heartburn, nausea and vomiting.  Neurological:  Negative for headaches.   Blood pressure 111/83, pulse 87, temperature 99.2 F (37.3 C), temperature source Oral, resp. rate 18, height 5\' 11"  (1.803 m), weight 90 kg, SpO2 98 %. Body mass index is 27.67 kg/m.  Mental Status Per Nursing Assessment::   On Admission:  Suicidal ideation indicated by patient  Demographic Factors:  Male, Caucasian, and Low socioeconomic status  Loss Factors: Loss of significant relationship  Historical Factors: Impulsivity  Risk Reduction Factors:   Positive coping skills or problem solving skills  Continued Clinical Symptoms:  Depression:   Comorbid alcohol abuse/dependence Hopelessness Impulsivity  Cognitive Features That Contribute To Risk:  None    Suicide Risk:  Minimal: No identifiable suicidal ideation.  Patients presenting with no risk factors but with morbid ruminations; may be classified as minimal risk based on the severity of the depressive  symptoms    Plan Of Care/Follow-up recommendations:  Activity: as tolerated  Diet: heart healthy  Other: -Follow-up with your outpatient psychiatric provider -instructions on appointment date, time, and address (location) are provided to you in discharge paperwork.  -Take your psychiatric medications as prescribed at discharge - instructions are provided to you in the discharge paperwork  -Follow-up with outpatient primary care doctor and other specialists -for management of chronic medical disease, including: Prediabetes, Hypertension  -Testing: Follow-up with outpatient provider for abnormal lab results: hepatic function panel  -Recommend abstinence from alcohol, tobacco, and other illicit drug use at discharge.   -If your psychiatric symptoms recur, worsen, or if you have side effects to your psychiatric medications, call your outpatient psychiatric provider, 911, [phone] or go to the nearest emergency department.  -If suicidal thoughts recur, call your outpatient psychiatric provider, 911, [phone] or go to the nearest emergency department.   [phone], MD 12/14/2021, 8:26 AM

## 2021-12-14 NOTE — Plan of Care (Signed)
All care plans completed by staff.

## 2021-12-14 NOTE — Plan of Care (Signed)
Nurse discussed anxiety, depression and coping skills with patient.  

## 2021-12-14 NOTE — Progress Notes (Signed)
Discharge Note:  Patient discharged with taxi voucher to [facility] in [place], [place].  Suicide prevention information given and discussed with patient who stated he understood and had no questions.  Patient denied SI and HI.  Denied A/V hallucination.  Patient stated he received all his belongings, clothing, toiletries, misc items, etc.  Patient stated he appreciated all assistance received from [facility] staff.  All required discharge information given.

## 2021-12-14 NOTE — Progress Notes (Signed)
  [facility] Adult Case Management Discharge Plan :  Will you be returning to the same living situation after discharge:  No. [facility] shelter [address] [place], [place] [address] At discharge, do you have transportation home?: Yes,  Taxi Do you have the ability to pay for your medications: Yes,  Insured  Release of information consent forms completed and in the chart;  Patient's signature needed at discharge.  Patient to Follow up at:  Follow-up Information     Services, [facility]. Go to.   Why: Please go to this provider for an assessment, to obtain therapy and medication management services during walk in hours:  Monday through Friday, from 8 am to 3 pm (please arrive early).   *  Address:  [address] [place], [place] information: [place] [address] [phone]                 Next level of care provider has access to [facility] and Suicide Prevention discussed: No. Attempted to call provided contact and was unsuccessful 3x.     Has patient been referred to the Quitline?: Patient refused referral  Patient has been referred for addiction treatment: Yes  Erik Burkett S. Almina Schul MSW, LCSW 12/14/2021, 9:26 AM

## 2021-12-14 NOTE — Progress Notes (Signed)
D:   Patient sleeps good, no sleep medication.  Good appetite, normal energy level, good concentration.  Denied anxiety, depression and hopeless.  Denied withdrawals.  Denied SI.  Denied physical problems.  Denied physical pain.  Goal is place to live after discharge.  Does have discharge plans. A:  Medications administered per MD orders.  Emotional support and encouragement given patient. R:  Denied SI and HI, contracts for safety.  Denied A/V hallucinations.  Safety maintained with 15 minute checks.

## 2021-12-14 NOTE — Discharge Summary (Signed)
Physician Discharge Summary Note  Patient:  Willie Herman is an 46 y.o., male MRN:  [id number] DOB:  1975/06/02 Patient phone:  [phone] (home)  Patient address:   [place] [address],  Total Time spent with patient: 45 minutes  Date of Admission:  12/09/2021 Date of Discharge: 12/14/21   Reason for Admission:  Patient is a 46y/o male with h/o MDD, GAD, methamphetamine abuse, and previous suicide attempts, who presented voluntarily and unaccompanied to [facility] ED for SI and HI with fear he would act on these ideations. According to intake notes, he did not have a specific suicide plan but had plausible methods he was considering including overdosing on street drugs or shooting himself. He had HI toward a drug dealer.     Principal Problem: MDD (major depressive disorder), recurrent severe, without psychosis (HCC) Discharge Diagnoses: Principal Problem:   MDD (major depressive disorder), recurrent severe, without psychosis (HCC) Active Problems:   Stimulant abuse (HCC)    Past Psychiatric History:  Previous Psychiatric Diagnoses: stimulant use d/o, MDD, substance induced mood d/o, GAD Current / Past Mental Health Providers: Unclear who had been prescribing meds - denies having current therapist/psychiatrist Previous Psychological Evaluations: Yes  Past Psychiatric Hospitalizations: [facility] 7/22; [facility] 7/22[facility] 3/22; [facility] 2/22 twice per EHR - patient does not recall all previous admissions Past Suicide Attempts: OD on Trazodone 7/22 per EHR Past History of Homicidal Behaviors / Violent or Aggressive Behaviors: Denied History of Self-Mutilation: Denied Previous Participation in PHP/IOP or Residential Programs: Denied Past History of ECT / [employer] / VNS: Denied Past Psychotropic Medication Trials: Lexapro, Abilify, Trazodone, Neurontin, Celexa, Prozac, Xanax, Wellbutrin, Viibryd, Ativan, Ambien per EHR    Past Medical History:  Past Medical History:  Diagnosis  Date   Anxiety    COPD (chronic obstructive pulmonary disease) (HCC)    Depression    diet controlled diabetes    former   GERD (gastroesophageal reflux disease)    High cholesterol    Hypertension    Suicidal ideation     Past Surgical History:  Procedure Laterality Date   ABDOMINAL SURGERY     APPENDECTOMY     LEG SURGERY     when 12. Patient reports he was in tractor accident and leg had to be repaired   MENISCUS REPAIR     VASCULAR SURGERY     Family History: History reviewed. No pertinent family history. Family Psychiatric  History:  Father had unknown mental health issues, addiction to "everything" and attempted suicide; denies completed suicides in the family  Social History:  Social History   Substance and Sexual Activity  Alcohol Use Not Currently     Social History   Substance and Sexual Activity  Drug Use Yes   Types: Methamphetamines   Comment: last used 4 days ago    Social History   Socioeconomic History   Marital status: Divorced    Spouse name: Not on file   Number of children: Not on file   Years of education: Not on file   Highest education level: Not on file  Occupational History   Not on file  Tobacco Use   Smoking status: Every Day    Packs/day: 1.50    Types: Cigarettes   Smokeless tobacco: Never  Vaping Use   Vaping Use: Never used  Substance and Sexual Activity   Alcohol use: Not Currently   Drug use: Yes    Types: Methamphetamines    Comment: last used 4  days ago   Sexual activity: Not Currently  Other Topics Concern   Not on file  Social History Narrative   Not on file   Social Determinants of Health   Financial Resource Strain: Not on file  Food Insecurity: Unknown (12/09/2021)   Hunger Vital Sign    Worried About Running Out of Food in the Last Year: Patient refused    [place] in the Last Year: Patient refused  Transportation Needs: Unknown (12/09/2021)   PRAPARE - Hydrologist  (Medical): Patient refused    Lack of Transportation (Non-Medical): Patient refused  Physical Activity: Not on file  Stress: Not on file  Social Connections: Not on file    Hospital Course:   Patient is a 46y/o male with h/o MDD, GAD, methamphetamine abuse, and previous suicide attempts, who presented voluntarily and unaccompanied to [facility] ED for SI and HI with fear he would act on these ideations. According to intake notes, he did not have a specific suicide plan but had plausible methods he was considering including overdosing on street drugs or shooting himself. He had HI toward a drug dealer.    Hospital Summary During the patient's hospitalization, patient had extensive initial psychiatric evaluation, and follow-up psychiatric evaluations every day.   Psychiatric diagnoses provided upon initial assessment:  MDD recurrent severe without psychotic features GAD  Methamphetamine abuse  Tobacco Use Disorder   Patient's psychiatric medications were adjusted on admission:  -- Restart Lexapro 10mg  daily  -- Start Abilify 5mg  daily  -- Start Trazodone 50mg  qhs PRN insomnia -- Start Vistaril 25mg  q6 hours PRN anxiety   During the hospitalization, other adjustments were made to the patient's psychiatric medication regimen:  --Increased Abilify to 15 mg --No further adjustments made   Gradually, patient started adjusting to milieu.   Patient's care was discussed during the interdisciplinary team meeting every day during the hospitalization.   The patient denies having side effects to prescribed psychiatric medication.   The patient reports their target psychiatric symptoms of depression and anxiety responded well to the psychiatric medications, and the patient reports overall benefit other psychiatric hospitalization. Supportive psychotherapy was provided to the patient. The patient also participated in regular group therapy while admitted.    Labs were reviewed with the patient,  and abnormal results were discussed with the patient.   The patient denied having suicidal thoughts more than 48 hours prior to discharge.  Patient denies having homicidal thoughts.  Patient denies having auditory hallucinations.  Patient denies any visual hallucinations.  Patient denies having paranoid thoughts.   The patient is able to verbalize their individual safety plan to this provider.   It is recommended to the patient to continue psychiatric medications as prescribed, after discharge from the hospital.     It is recommended to the patient to follow up with your outpatient psychiatric provider and PCP.   Discussed with the patient, the impact of alcohol, drugs, tobacco have been there overall psychiatric and medical wellbeing, and total abstinence from substance use was recommended the patient.  Physical Findings: AIMS: 0, no cogwheeling rigidty, abnormal facial movement  Musculoskeletal: Strength & Muscle Tone: within normal limits Gait & Station: normal Patient leans: N/A   Psychiatric Specialty Exam:  Presentation  General Appearance: Appropriate for Environment; Casual   Eye Contact:Good   Speech:Clear and Coherent; Normal Rate   Speech Volume:Normal   Handedness:No data recorded   Mood and Affect  Mood:Euthymic   Affect:Congruent  Thought Process  Thought Processes:Coherent; Goal Directed   Descriptions of Associations:Intact   Orientation:Full (Time, Place and Person)   Thought Content:Logical   History of Schizophrenia/Schizoaffective disorder:No   Duration of Psychotic Symptoms:No data recorded  Hallucinations:No data recorded  Ideas of Reference:None   Suicidal Thoughts:No data recorded  Homicidal Thoughts:No data recorded   Sensorium  Memory:Immediate Good; Recent Good; Remote Good   Judgment:Good   Insight:Good    Executive Functions  Concentration:Good   Attention Span:Good   Recall:Good   Fund of  Knowledge:Good   Language:Good    Psychomotor Activity  Psychomotor Activity:No data recorded   Assets  Assets:Communication Skills; Desire for Improvement; Housing; Physical Health; Resilience    Sleep  Sleep:No data recorded    Physical Exam: Physical Exam Vitals and nursing note reviewed.  Constitutional:      Appearance: Normal appearance. He is normal weight.  HENT:     Head: Normocephalic and atraumatic.  Pulmonary:     Effort: Pulmonary effort is normal.  Neurological:     General: No focal deficit present.     Mental Status: He is oriented to person, place, and time.    Review of Systems  Respiratory:  Negative for shortness of breath.   Cardiovascular:  Negative for chest pain.  Gastrointestinal:  Negative for abdominal pain, constipation, diarrhea, heartburn, nausea and vomiting.  Neurological:  Negative for headaches.   Blood pressure 125/86, pulse 82, temperature 99.2 F (37.3 C), temperature source Oral, resp. rate 18, height 5\' 11"  (1.803 m), weight 90 kg, SpO2 98 %. Body mass index is 27.67 kg/m.   Social History   Tobacco Use  Smoking Status Every Day   Packs/day: 1.50   Types: Cigarettes  Smokeless Tobacco Never   Tobacco Cessation:  N/A, patient does not currently use tobacco products   Blood Alcohol level:  Lab Results  Component Value Date   ETH <10 12/08/2021   ETH <10 12/03/2021    Metabolic Disorder Labs:  Lab Results  Component Value Date   HGBA1C 5.9 (H) 11/16/2021   MPG 122.63 11/16/2021   MPG 128 08/20/2020   No results found for: "PROLACTIN" Lab Results  Component Value Date   CHOL 172 12/11/2021   TRIG 136 12/11/2021   HDL 32 (L) 12/11/2021   CHOLHDL 5.4 12/11/2021   VLDL 27 12/11/2021   LDLCALC 113 (H) 12/11/2021   LDLCALC 115 (H) 05/20/2020    See Psychiatric Specialty Exam and Suicide Risk Assessment completed by Attending Physician prior to discharge.  Discharge destination:  [facility]  Shelter  Is patient on multiple antipsychotic therapies at discharge:  No   Has Patient had three or more failed trials of antipsychotic monotherapy by history:  No  Recommended Plan for Multiple Antipsychotic Therapies: NA   Allergies as of 12/14/2021       Reactions   Pineapple Shortness Of Breath, Rash        Medication List     STOP taking these medications    BC HEADACHE POWDER PO       TAKE these medications      Indication  ARIPiprazole 15 MG tablet Commonly known as: ABILIFY Take 1 tablet (15 mg total) by mouth daily.  Indication: Major Depressive Disorder   escitalopram 10 MG tablet Commonly known as: LEXAPRO Take 1 tablet (10 mg total) by mouth daily.  Indication: Major Depressive Disorder   hydrOXYzine 25 MG tablet Commonly known as: ATARAX Take 1 tablet (25 mg total) by mouth  every 6 (six) hours as needed for anxiety.  Indication: Feeling Anxious   nicotine 21 mg/24hr patch Commonly known as: NICODERM CQ - dosed in mg/24 hours Place 1 patch (21 mg total) onto the skin daily.  Indication: Nicotine Addiction   pantoprazole 20 MG tablet Commonly known as: PROTONIX Take 1 tablet (20 mg total) by mouth daily.  Indication: Gastroesophageal Reflux Disease          Follow-up recommendations:   Activity:  as tolerated Diet:  heart healthy   Comments:  Prescriptions were given at discharge.  Patient is agreeable with the discharge plan.  Patient was given an opportunity to ask questions.  Patient appears to feel comfortable with discharge and denies any current suicidal or homicidal thoughts.    Patient is instructed prior to discharge to: Take all medications as prescribed by mental healthcare provider. Report any adverse effects and or reactions from the medicines to outpatient provider promptly. In the event of worsening symptoms, patient is instructed to call the crisis hotline, 911 and or go to the nearest ED for appropriate evaluation and  treatment of symptoms. Patient is to follow-up with primary care provider for other medical issues, concerns and or health care needs.   Signed: Park PopeAndrew Jafari Mckillop, MD 12/14/2021, 7:12 AM

## 2021-12-14 NOTE — Plan of Care (Signed)
  Problem: Education: Goal: Knowledge of [facility] Education information/materials will improve Outcome: Progressing   Problem: Education: Goal: Emotional status will improve Outcome: Progressing   Problem: Education: Goal: Mental status will improve Outcome: Progressing   Problem: Education: Goal: Verbalization of understanding the information provided will improve Outcome: Progressing   Problem: Activity: Goal: Imbalance in normal sleep/wake cycle will improve Outcome: Progressing   Problem: Coping: Goal: Coping ability will improve Outcome: Progressing  Patient compliant with medications interacting well with Peers and Staff. Support and encouragement provided.

## 2021-12-18 ENCOUNTER — Other Ambulatory Visit: Payer: Self-pay

## 2021-12-18 ENCOUNTER — Emergency Department (HOSPITAL_COMMUNITY): Payer: 59

## 2021-12-18 ENCOUNTER — Encounter (HOSPITAL_COMMUNITY): Payer: Self-pay | Admitting: Emergency Medicine

## 2021-12-18 DIAGNOSIS — M25551 Pain in right hip: Secondary | ICD-10-CM | POA: Insufficient documentation

## 2021-12-18 DIAGNOSIS — M25751 Osteophyte, right hip: Secondary | ICD-10-CM | POA: Diagnosis not present

## 2021-12-18 DIAGNOSIS — M545 Low back pain, unspecified: Secondary | ICD-10-CM | POA: Diagnosis not present

## 2021-12-18 DIAGNOSIS — R2243 Localized swelling, mass and lump, lower limb, bilateral: Secondary | ICD-10-CM | POA: Insufficient documentation

## 2021-12-18 DIAGNOSIS — M1611 Unilateral primary osteoarthritis, right hip: Secondary | ICD-10-CM | POA: Diagnosis not present

## 2021-12-18 DIAGNOSIS — Z5321 Procedure and treatment not carried out due to patient leaving prior to being seen by health care provider: Secondary | ICD-10-CM | POA: Diagnosis not present

## 2021-12-18 DIAGNOSIS — M47816 Spondylosis without myelopathy or radiculopathy, lumbar region: Secondary | ICD-10-CM | POA: Diagnosis not present

## 2021-12-18 NOTE — ED Triage Notes (Signed)
Pt with c/o R hip pain (denies injury) as well as bilateral leg cramping.

## 2021-12-19 ENCOUNTER — Emergency Department (HOSPITAL_COMMUNITY)
Admission: EM | Admit: 2021-12-19 | Discharge: 2021-12-19 | Discharge status: Against Medical Advice | Payer: 59 | Attending: Emergency Medicine | Admitting: Emergency Medicine

## 2021-12-19 DIAGNOSIS — M47816 Spondylosis without myelopathy or radiculopathy, lumbar region: Secondary | ICD-10-CM | POA: Diagnosis not present

## 2021-12-19 DIAGNOSIS — M1611 Unilateral primary osteoarthritis, right hip: Secondary | ICD-10-CM | POA: Diagnosis not present

## 2021-12-19 DIAGNOSIS — M545 Low back pain, unspecified: Secondary | ICD-10-CM | POA: Diagnosis not present

## 2021-12-19 DIAGNOSIS — M25751 Osteophyte, right hip: Secondary | ICD-10-CM | POA: Diagnosis not present

## 2022-01-02 DIAGNOSIS — R410 Disorientation, unspecified: Secondary | ICD-10-CM | POA: Diagnosis not present

## 2022-08-11 DIAGNOSIS — N201 Calculus of ureter: Secondary | ICD-10-CM | POA: Diagnosis not present

## 2022-08-11 DIAGNOSIS — R109 Unspecified abdominal pain: Secondary | ICD-10-CM | POA: Diagnosis not present

## 2023-05-18 DIAGNOSIS — F332 Major depressive disorder, recurrent severe without psychotic features: Secondary | ICD-10-CM | POA: Diagnosis not present

## 2023-05-18 DIAGNOSIS — T50902A Poisoning by unspecified drugs, medicaments and biological substances, intentional self-harm, initial encounter: Secondary | ICD-10-CM | POA: Diagnosis not present

## 2023-05-18 DIAGNOSIS — R45851 Suicidal ideations: Secondary | ICD-10-CM | POA: Diagnosis not present

## 2023-05-19 DIAGNOSIS — F332 Major depressive disorder, recurrent severe without psychotic features: Secondary | ICD-10-CM | POA: Diagnosis not present

## 2023-05-20 DIAGNOSIS — F332 Major depressive disorder, recurrent severe without psychotic features: Secondary | ICD-10-CM | POA: Diagnosis not present

## 2023-05-20 DIAGNOSIS — R001 Bradycardia, unspecified: Secondary | ICD-10-CM | POA: Diagnosis not present

## 2023-05-20 DIAGNOSIS — F411 Generalized anxiety disorder: Secondary | ICD-10-CM | POA: Diagnosis not present

## 2023-05-21 DIAGNOSIS — F411 Generalized anxiety disorder: Secondary | ICD-10-CM | POA: Diagnosis not present

## 2023-05-21 DIAGNOSIS — F4310 Post-traumatic stress disorder, unspecified: Secondary | ICD-10-CM | POA: Diagnosis not present

## 2023-05-21 DIAGNOSIS — F332 Major depressive disorder, recurrent severe without psychotic features: Secondary | ICD-10-CM | POA: Diagnosis not present

## 2023-06-25 DIAGNOSIS — R45851 Suicidal ideations: Secondary | ICD-10-CM | POA: Diagnosis not present

## 2023-06-25 DIAGNOSIS — R059 Cough, unspecified: Secondary | ICD-10-CM | POA: Diagnosis not present

## 2023-06-25 DIAGNOSIS — Z20822 Contact with and (suspected) exposure to covid-19: Secondary | ICD-10-CM | POA: Diagnosis not present

## 2023-06-25 DIAGNOSIS — Z720 Tobacco use: Secondary | ICD-10-CM | POA: Diagnosis not present

## 2023-06-25 DIAGNOSIS — J111 Influenza due to unidentified influenza virus with other respiratory manifestations: Secondary | ICD-10-CM | POA: Diagnosis not present

## 2023-06-27 DIAGNOSIS — R918 Other nonspecific abnormal finding of lung field: Secondary | ICD-10-CM | POA: Diagnosis not present

## 2023-06-27 DIAGNOSIS — R0602 Shortness of breath: Secondary | ICD-10-CM | POA: Diagnosis not present

## 2023-06-27 DIAGNOSIS — R062 Wheezing: Secondary | ICD-10-CM | POA: Diagnosis not present

## 2023-07-02 DIAGNOSIS — F17210 Nicotine dependence, cigarettes, uncomplicated: Secondary | ICD-10-CM | POA: Diagnosis not present

## 2023-07-02 DIAGNOSIS — F419 Anxiety disorder, unspecified: Secondary | ICD-10-CM | POA: Diagnosis not present

## 2023-07-02 DIAGNOSIS — Z560 Unemployment, unspecified: Secondary | ICD-10-CM | POA: Diagnosis not present

## 2023-07-02 DIAGNOSIS — F332 Major depressive disorder, recurrent severe without psychotic features: Secondary | ICD-10-CM | POA: Diagnosis not present

## 2023-07-02 DIAGNOSIS — Z79899 Other long term (current) drug therapy: Secondary | ICD-10-CM | POA: Diagnosis not present

## 2023-07-02 DIAGNOSIS — Z818 Family history of other mental and behavioral disorders: Secondary | ICD-10-CM | POA: Diagnosis not present

## 2023-07-02 DIAGNOSIS — Z5986 Financial insecurity: Secondary | ICD-10-CM | POA: Diagnosis not present

## 2023-07-02 DIAGNOSIS — R0989 Other specified symptoms and signs involving the circulatory and respiratory systems: Secondary | ICD-10-CM | POA: Diagnosis not present

## 2023-07-02 DIAGNOSIS — F411 Generalized anxiety disorder: Secondary | ICD-10-CM | POA: Diagnosis not present

## 2023-07-02 DIAGNOSIS — Z62810 Personal history of physical and sexual abuse in childhood: Secondary | ICD-10-CM | POA: Diagnosis not present

## 2023-07-02 DIAGNOSIS — R059 Cough, unspecified: Secondary | ICD-10-CM | POA: Diagnosis not present

## 2023-07-02 DIAGNOSIS — Z638 Other specified problems related to primary support group: Secondary | ICD-10-CM | POA: Diagnosis not present

## 2023-07-02 DIAGNOSIS — Z9151 Personal history of suicidal behavior: Secondary | ICD-10-CM | POA: Diagnosis not present

## 2023-07-02 DIAGNOSIS — Z59811 Housing instability, housed, with risk of homelessness: Secondary | ICD-10-CM | POA: Diagnosis not present

## 2023-07-02 DIAGNOSIS — R45851 Suicidal ideations: Secondary | ICD-10-CM | POA: Diagnosis not present

## 2023-07-02 DIAGNOSIS — Z653 Problems related to other legal circumstances: Secondary | ICD-10-CM | POA: Diagnosis not present

## 2023-07-14 DIAGNOSIS — R45851 Suicidal ideations: Secondary | ICD-10-CM | POA: Diagnosis not present

## 2023-07-14 DIAGNOSIS — F332 Major depressive disorder, recurrent severe without psychotic features: Secondary | ICD-10-CM | POA: Diagnosis not present

## 2023-07-15 DIAGNOSIS — F39 Unspecified mood [affective] disorder: Secondary | ICD-10-CM | POA: Diagnosis not present

## 2023-07-15 DIAGNOSIS — R338 Other retention of urine: Secondary | ICD-10-CM | POA: Diagnosis not present

## 2023-07-15 DIAGNOSIS — F411 Generalized anxiety disorder: Secondary | ICD-10-CM | POA: Diagnosis not present

## 2023-07-15 DIAGNOSIS — G4700 Insomnia, unspecified: Secondary | ICD-10-CM | POA: Diagnosis not present

## 2023-07-15 DIAGNOSIS — F1910 Other psychoactive substance abuse, uncomplicated: Secondary | ICD-10-CM | POA: Diagnosis not present

## 2023-07-15 DIAGNOSIS — N401 Enlarged prostate with lower urinary tract symptoms: Secondary | ICD-10-CM | POA: Diagnosis not present

## 2023-07-15 DIAGNOSIS — R4589 Other symptoms and signs involving emotional state: Secondary | ICD-10-CM | POA: Diagnosis not present

## 2023-07-15 DIAGNOSIS — R45851 Suicidal ideations: Secondary | ICD-10-CM | POA: Diagnosis not present

## 2023-07-15 DIAGNOSIS — F332 Major depressive disorder, recurrent severe without psychotic features: Secondary | ICD-10-CM | POA: Diagnosis not present

## 2023-07-15 DIAGNOSIS — K4090 Unilateral inguinal hernia, without obstruction or gangrene, not specified as recurrent: Secondary | ICD-10-CM | POA: Diagnosis not present

## 2023-07-24 DIAGNOSIS — F332 Major depressive disorder, recurrent severe without psychotic features: Secondary | ICD-10-CM | POA: Diagnosis not present

## 2023-08-15 DIAGNOSIS — R300 Dysuria: Secondary | ICD-10-CM | POA: Diagnosis not present

## 2023-08-15 DIAGNOSIS — K4020 Bilateral inguinal hernia, without obstruction or gangrene, not specified as recurrent: Secondary | ICD-10-CM | POA: Diagnosis not present

## 2023-08-15 DIAGNOSIS — R1031 Right lower quadrant pain: Secondary | ICD-10-CM | POA: Diagnosis not present

## 2023-08-15 DIAGNOSIS — Z0389 Encounter for observation for other suspected diseases and conditions ruled out: Secondary | ICD-10-CM | POA: Diagnosis not present

## 2023-09-17 DIAGNOSIS — R45851 Suicidal ideations: Secondary | ICD-10-CM | POA: Diagnosis not present

## 2023-09-18 DIAGNOSIS — Z62810 Personal history of physical and sexual abuse in childhood: Secondary | ICD-10-CM | POA: Diagnosis not present

## 2023-09-18 DIAGNOSIS — F332 Major depressive disorder, recurrent severe without psychotic features: Secondary | ICD-10-CM | POA: Diagnosis not present

## 2023-09-18 DIAGNOSIS — I10 Essential (primary) hypertension: Secondary | ICD-10-CM | POA: Diagnosis not present

## 2023-09-18 DIAGNOSIS — F331 Major depressive disorder, recurrent, moderate: Secondary | ICD-10-CM | POA: Diagnosis not present

## 2023-09-18 DIAGNOSIS — F17210 Nicotine dependence, cigarettes, uncomplicated: Secondary | ICD-10-CM | POA: Diagnosis not present

## 2023-09-18 DIAGNOSIS — Z5901 Sheltered homelessness: Secondary | ICD-10-CM | POA: Diagnosis not present

## 2023-09-18 DIAGNOSIS — Z79899 Other long term (current) drug therapy: Secondary | ICD-10-CM | POA: Diagnosis not present

## 2023-09-18 DIAGNOSIS — M6282 Rhabdomyolysis: Secondary | ICD-10-CM | POA: Diagnosis not present

## 2023-09-18 DIAGNOSIS — Z9151 Personal history of suicidal behavior: Secondary | ICD-10-CM | POA: Diagnosis not present

## 2023-09-18 DIAGNOSIS — F4310 Post-traumatic stress disorder, unspecified: Secondary | ICD-10-CM | POA: Diagnosis not present

## 2023-09-18 DIAGNOSIS — R45851 Suicidal ideations: Secondary | ICD-10-CM | POA: Diagnosis not present

## 2023-09-18 DIAGNOSIS — E860 Dehydration: Secondary | ICD-10-CM | POA: Diagnosis not present

## 2023-09-18 DIAGNOSIS — F17290 Nicotine dependence, other tobacco product, uncomplicated: Secondary | ICD-10-CM | POA: Diagnosis not present

## 2023-09-19 DIAGNOSIS — Z79899 Other long term (current) drug therapy: Secondary | ICD-10-CM | POA: Diagnosis not present

## 2023-09-19 DIAGNOSIS — F17210 Nicotine dependence, cigarettes, uncomplicated: Secondary | ICD-10-CM | POA: Diagnosis not present

## 2023-09-19 DIAGNOSIS — R45851 Suicidal ideations: Secondary | ICD-10-CM | POA: Diagnosis not present

## 2023-09-19 DIAGNOSIS — E860 Dehydration: Secondary | ICD-10-CM | POA: Diagnosis not present

## 2023-09-19 DIAGNOSIS — M6282 Rhabdomyolysis: Secondary | ICD-10-CM | POA: Diagnosis not present

## 2023-09-19 DIAGNOSIS — Z5901 Sheltered homelessness: Secondary | ICD-10-CM | POA: Diagnosis not present

## 2023-09-19 DIAGNOSIS — F4310 Post-traumatic stress disorder, unspecified: Secondary | ICD-10-CM | POA: Diagnosis not present

## 2023-09-19 DIAGNOSIS — F332 Major depressive disorder, recurrent severe without psychotic features: Secondary | ICD-10-CM | POA: Diagnosis not present

## 2023-09-19 DIAGNOSIS — I10 Essential (primary) hypertension: Secondary | ICD-10-CM | POA: Diagnosis not present

## 2023-09-19 DIAGNOSIS — Z9151 Personal history of suicidal behavior: Secondary | ICD-10-CM | POA: Diagnosis not present

## 2023-09-19 DIAGNOSIS — Z62810 Personal history of physical and sexual abuse in childhood: Secondary | ICD-10-CM | POA: Diagnosis not present

## 2023-09-19 DIAGNOSIS — F17290 Nicotine dependence, other tobacco product, uncomplicated: Secondary | ICD-10-CM | POA: Diagnosis not present

## 2023-09-20 DIAGNOSIS — Z5901 Sheltered homelessness: Secondary | ICD-10-CM | POA: Diagnosis not present

## 2023-09-20 DIAGNOSIS — R45851 Suicidal ideations: Secondary | ICD-10-CM | POA: Diagnosis not present

## 2023-09-20 DIAGNOSIS — M6282 Rhabdomyolysis: Secondary | ICD-10-CM | POA: Diagnosis not present

## 2023-09-20 DIAGNOSIS — Z9151 Personal history of suicidal behavior: Secondary | ICD-10-CM | POA: Diagnosis not present

## 2023-09-20 DIAGNOSIS — F17210 Nicotine dependence, cigarettes, uncomplicated: Secondary | ICD-10-CM | POA: Diagnosis not present

## 2023-09-20 DIAGNOSIS — Z62810 Personal history of physical and sexual abuse in childhood: Secondary | ICD-10-CM | POA: Diagnosis not present

## 2023-09-20 DIAGNOSIS — E860 Dehydration: Secondary | ICD-10-CM | POA: Diagnosis not present

## 2023-09-20 DIAGNOSIS — F17290 Nicotine dependence, other tobacco product, uncomplicated: Secondary | ICD-10-CM | POA: Diagnosis not present

## 2023-09-20 DIAGNOSIS — I10 Essential (primary) hypertension: Secondary | ICD-10-CM | POA: Diagnosis not present

## 2023-09-20 DIAGNOSIS — Z79899 Other long term (current) drug therapy: Secondary | ICD-10-CM | POA: Diagnosis not present

## 2023-09-20 DIAGNOSIS — F4310 Post-traumatic stress disorder, unspecified: Secondary | ICD-10-CM | POA: Diagnosis not present

## 2023-09-20 DIAGNOSIS — F332 Major depressive disorder, recurrent severe without psychotic features: Secondary | ICD-10-CM | POA: Diagnosis not present

## 2023-10-04 DIAGNOSIS — R001 Bradycardia, unspecified: Secondary | ICD-10-CM | POA: Diagnosis not present
